# Patient Record
Sex: Female | Born: 1935
Health system: Southern US, Community
[De-identification: ages and names within clinical notes are randomized; demographics above are authoritative.]

## PROBLEM LIST (undated history)

## (undated) DIAGNOSIS — I10 Essential (primary) hypertension: Secondary | ICD-10-CM

## (undated) DIAGNOSIS — K5790 Diverticulosis of intestine, part unspecified, without perforation or abscess without bleeding: Secondary | ICD-10-CM

## (undated) DIAGNOSIS — E039 Hypothyroidism, unspecified: Secondary | ICD-10-CM

## (undated) DIAGNOSIS — I493 Ventricular premature depolarization: Secondary | ICD-10-CM

## (undated) DIAGNOSIS — E785 Hyperlipidemia, unspecified: Secondary | ICD-10-CM

## (undated) DIAGNOSIS — I472 Ventricular tachycardia, unspecified: Secondary | ICD-10-CM

## (undated) DIAGNOSIS — E119 Type 2 diabetes mellitus without complications: Secondary | ICD-10-CM

## (undated) DIAGNOSIS — K5792 Diverticulitis of intestine, part unspecified, without perforation or abscess without bleeding: Secondary | ICD-10-CM

## (undated) DIAGNOSIS — C4491 Basal cell carcinoma of skin, unspecified: Secondary | ICD-10-CM

## (undated) DIAGNOSIS — I080 Rheumatic disorders of both mitral and aortic valves: Secondary | ICD-10-CM

## (undated) DIAGNOSIS — C50919 Malignant neoplasm of unspecified site of unspecified female breast: Secondary | ICD-10-CM

## (undated) DIAGNOSIS — Z923 Personal history of irradiation: Secondary | ICD-10-CM

## (undated) DIAGNOSIS — H269 Unspecified cataract: Secondary | ICD-10-CM

## (undated) HISTORY — DX: Rheumatic disorders of both mitral and aortic valves: I08.0

## (undated) HISTORY — DX: Ventricular premature depolarization: I49.3

## (undated) HISTORY — DX: Basal cell carcinoma of skin, unspecified: C44.91

## (undated) HISTORY — DX: Ventricular tachycardia, unspecified: I47.20

## (undated) HISTORY — DX: Hyperlipidemia, unspecified: E78.5

## (undated) HISTORY — DX: Unspecified cataract: H26.9

## (undated) HISTORY — PX: TUBAL LIGATION: SHX77

## (undated) HISTORY — PX: COLONOSCOPY: SHX174

## (undated) HISTORY — PX: TONSILLECTOMY: SUR1361

## (undated) HISTORY — DX: Ventricular tachycardia: I47.2

## (undated) HISTORY — DX: Type 2 diabetes mellitus without complications: E11.9

## (undated) HISTORY — DX: Essential (primary) hypertension: I10

## (undated) HISTORY — DX: Hypothyroidism, unspecified: E03.9

## (undated) HISTORY — PX: EYE SURGERY: SHX253

## (undated) HISTORY — PX: APPENDECTOMY: SHX54

---

## 1997-08-28 ENCOUNTER — Other Ambulatory Visit: Admission: RE | Admit: 1997-08-28 | Discharge: 1997-08-28 | Payer: Self-pay | Admitting: *Deleted

## 1997-11-06 ENCOUNTER — Other Ambulatory Visit: Admission: RE | Admit: 1997-11-06 | Discharge: 1997-11-06 | Payer: Self-pay | Admitting: *Deleted

## 1998-10-21 ENCOUNTER — Other Ambulatory Visit: Admission: RE | Admit: 1998-10-21 | Discharge: 1998-10-21 | Payer: Self-pay | Admitting: *Deleted

## 2000-02-10 ENCOUNTER — Other Ambulatory Visit: Admission: RE | Admit: 2000-02-10 | Discharge: 2000-02-10 | Payer: Self-pay | Admitting: *Deleted

## 2001-06-15 ENCOUNTER — Other Ambulatory Visit: Admission: RE | Admit: 2001-06-15 | Discharge: 2001-06-15 | Payer: Self-pay | Admitting: *Deleted

## 2002-07-11 ENCOUNTER — Other Ambulatory Visit: Admission: RE | Admit: 2002-07-11 | Discharge: 2002-07-11 | Payer: Self-pay | Admitting: *Deleted

## 2002-10-19 ENCOUNTER — Ambulatory Visit (HOSPITAL_COMMUNITY): Admission: RE | Admit: 2002-10-19 | Discharge: 2002-10-19 | Payer: Self-pay | Admitting: Gastroenterology

## 2003-08-08 ENCOUNTER — Other Ambulatory Visit: Admission: RE | Admit: 2003-08-08 | Discharge: 2003-08-08 | Payer: Self-pay | Admitting: *Deleted

## 2004-10-08 ENCOUNTER — Other Ambulatory Visit: Admission: RE | Admit: 2004-10-08 | Discharge: 2004-10-08 | Payer: Self-pay | Admitting: *Deleted

## 2005-10-26 ENCOUNTER — Other Ambulatory Visit: Admission: RE | Admit: 2005-10-26 | Discharge: 2005-10-26 | Payer: Self-pay | Admitting: *Deleted

## 2006-10-31 ENCOUNTER — Other Ambulatory Visit: Admission: RE | Admit: 2006-10-31 | Discharge: 2006-10-31 | Payer: Self-pay | Admitting: *Deleted

## 2007-01-06 ENCOUNTER — Encounter: Payer: Self-pay | Admitting: Cardiology

## 2007-01-06 ENCOUNTER — Ambulatory Visit: Payer: Self-pay

## 2007-01-10 ENCOUNTER — Ambulatory Visit: Payer: Self-pay | Admitting: Cardiology

## 2007-01-13 ENCOUNTER — Ambulatory Visit: Payer: Self-pay | Admitting: Cardiology

## 2007-02-15 ENCOUNTER — Ambulatory Visit: Payer: Self-pay | Admitting: Cardiology

## 2007-02-28 ENCOUNTER — Ambulatory Visit: Payer: Self-pay | Admitting: Cardiology

## 2007-09-13 ENCOUNTER — Encounter: Payer: Self-pay | Admitting: Cardiology

## 2007-10-10 ENCOUNTER — Ambulatory Visit: Payer: Self-pay | Admitting: Cardiology

## 2008-05-20 ENCOUNTER — Ambulatory Visit: Payer: Self-pay | Admitting: Cardiology

## 2008-07-25 DIAGNOSIS — I10 Essential (primary) hypertension: Secondary | ICD-10-CM | POA: Insufficient documentation

## 2008-07-25 DIAGNOSIS — I080 Rheumatic disorders of both mitral and aortic valves: Secondary | ICD-10-CM | POA: Insufficient documentation

## 2008-07-25 DIAGNOSIS — E785 Hyperlipidemia, unspecified: Secondary | ICD-10-CM | POA: Insufficient documentation

## 2008-12-20 ENCOUNTER — Encounter: Admission: RE | Admit: 2008-12-20 | Discharge: 2008-12-20 | Payer: Self-pay | Admitting: Obstetrics and Gynecology

## 2008-12-26 ENCOUNTER — Ambulatory Visit (HOSPITAL_COMMUNITY): Admission: RE | Admit: 2008-12-26 | Discharge: 2008-12-26 | Payer: Self-pay | Admitting: Obstetrics and Gynecology

## 2009-05-22 ENCOUNTER — Encounter
Admission: RE | Admit: 2009-05-22 | Discharge: 2009-05-22 | Payer: Self-pay | Source: Home / Self Care | Admitting: Endocrinology

## 2009-07-09 ENCOUNTER — Encounter: Admission: RE | Admit: 2009-07-09 | Discharge: 2009-07-09 | Payer: Self-pay | Admitting: Obstetrics and Gynecology

## 2009-09-25 ENCOUNTER — Encounter: Admission: RE | Admit: 2009-09-25 | Discharge: 2009-10-03 | Payer: Self-pay | Admitting: Endocrinology

## 2010-01-25 ENCOUNTER — Encounter: Payer: Self-pay | Admitting: Obstetrics and Gynecology

## 2010-01-29 ENCOUNTER — Encounter
Admission: RE | Admit: 2010-01-29 | Discharge: 2010-01-29 | Payer: Self-pay | Source: Home / Self Care | Attending: Obstetrics and Gynecology | Admitting: Obstetrics and Gynecology

## 2010-05-19 NOTE — Assessment & Plan Note (Signed)
[facility]                          [facility]   NAME:NATIONSDennette, Faulconer                    MRN:          [id number]  DATE:05/20/2008                            DOB:          March 06, 1935    HISTORY OF PRESENT ILLNESS:  The patient is a pleasant 75 year old  female with a history of palpitations.  The patient has documented PVCs  in the past.  She states that she felt that episodes several weeks ago  with back pain and some abdominal pain.  She was concerned about UTI.  Dr. Dimas Aguas also was concerned whether she could have any side effects to  statin drug therapy and this was stopped at that time.  In the  meanwhile, the patient had sought a chiropractor, and she has her back  taking care of and now she states her symptoms have improved quite a  bit.  She was also concerned during this time because her heart rate was  in the low 30s.  However, I told that she likely has bigeminal rhythm  and the heart rate was truly in the 60s.  There was no associated  presyncope or syncope.   MEDICATIONS:  1. Synthroid 88 mcg p.o. daily.  2. MegaRed.  3. Vitamin D.  4. Coenzyme Q10.  5. Multivitamin.  6. Aspirin 81 mg p.o. daily.  7. Caltrate.  8. Losartan 50/12.5 b.i.d.  9. Osteo Bi-Flex.  10.Glucosamine daily.   PHYSICAL EXAMINATION:  VITAL SIGNS:  Blood pressure 131/82, heart rate  75 beats per minute, weight 175 pounds.  NECK:  Normal carotid stroke.  No carotid bruits.  LUNGS:  Clear breath sounds bilaterally.  HEART:  Regular rate and rhythm with normal S1 and S2.  No murmur, rubs,  or gallops.  ABDOMEN:  Soft, nontender.  No rebound or guarding.  Good bowel sounds.  EXTREMITIES:  No cyanosis, clubbing, or edema.   PROBLEMS:  1. History of premature ventricular contractions and bigeminy.  2. Normal left ventricular function and mild mitral regurgitation.  3. Hyperthyroidism.  4. Hypertension, controlled.  5. Dyslipidemia.    PLAN:  1. I do not think the patient's statin drug therapy has any      relationship to her symptoms.  She can resume this.  2. The patient likely had brief episodes of irritable bowel-type      symptoms.  This also has resolved.  3. Retried BETA-BLOCKERS in the past, the patient could not tolerate      due to fatigue.  At this point, I would not institute any further      medical therapy.      [name], MD,FACC  Electronically Signed    GED/MedQ  DD: 05/20/2008  DT: 05/21/2008  Job #: [id number]   cc:   Selinda Flavin

## 2010-05-19 NOTE — Assessment & Plan Note (Signed)
[facility]                          [facility]   NAME:Cristina Douglas, Cristina Douglas              MRN:          [id number]  DATE:02/15/2007                            DOB:          1935-09-02    REFERRING PHYSICIAN:  Selinda Flavin   HISTORY OF PRESENT ILLNESS:  Patient is a 75 year old female with the  history of palpitation.  She reports a prior history of transient  dizziness and hypertension.  The patient underwent cardiac monitoring,  which shows predominantly normal sinus rhythm with occasional PVCs and  bigeminy.  She was initially prescribed metoprolol, but stopped this due  to a decrease in heart rate and relatively low blood pressure.  She is  now off the medications, actually feeling quite well.  She had an  echocardiographic study done, which showed no evidence of LV  dysfunction.  Her ejection fraction was within normal limits.   MEDICATIONS:  1. Synthroid 88 mcg p.o. daily.  2. Lipitor 80 mg p.o. q.h.s.  3. Mega Red fish oil.  4. Vitamin D.  5. Co-enzyme Q10.  6. Multivitamin.  7. Aspirin 81 mg a day.  8. Caltrate.  9. Diovan/hydrochlorothiazide 80 mg/25 mg.   PHYSICAL EXAMINATION:  Blood pressure 135/86, weight 176 pounds.  NECK EXAM:  Normal carotid upstroke, no carotid bruits.  LUNGS:  Clear breath sounds bilaterally.  HEART:  Regular rate and rhythm, normal S1, S2, no murmurs or gallops.  ABDOMEN:  Soft.  EXTREMITY EXAM:  No cyanosis, clubbing or edema.   PROBLEM LIST:  1. Premature ventricular contractions.  2. Normal LV systolic function.  3. Mild mitral regurgitation.  4. Hypothyroidism.  5. Hypertension.  6. Hyperlipidemia.   PLAN:  1. Patient is actually doing quite well from the perspective of      palpitations.  She was reassured that her PVC are rather benign and      we will discontinue metoprolol altogether.  2. The patient will need further screening with a Cardiolite stress      test to make  sure that her      PVC are not related to underlying ischemic heart disease, although      this is less likely.  3. The patient will follow up with me in the next couple of months.     [name], MD,FACC  Electronically Signed    GED/MedQ  DD: 02/16/2007  DT: 02/17/2007  Job #: [id number]   cc:   Selinda Flavin

## 2010-05-19 NOTE — Assessment & Plan Note (Signed)
[facility]                          [facility]   NAME:NATIONSMalyssa, Cristina Douglas                    MRN:          [id number]  DATE:10/10/2007                            DOB:          1935-09-17    REFERRING PHYSICIAN:  Dr. Selinda Douglas.   HISTORY OF PRESENT ILLNESS:  The patient is very pleasant 75 year old  female with a history of palpitations.  The patient actually has been  doing well and reports no palpitations.  She is mainly concerned about  her blood pressure and brought in some blood pressure readings.  She  states that on the higher dose of Diovan at 160 mg she felt washed out.  However, on the lower dose, her blood pressure does not appear to be  controlled.  She denies any orthopnea, PND, palpitations or syncope.   MEDICATIONS:  1. Synthroid 88 mcg p.o. daily.  2. Lipitor 80 mg p.o. nightly.  3. Omega-3.  4. Vitamin D.  5. Coenzyme Q10.  6. Multivitamin.  7. Aspirin 81 mg p.o. daily.  8. Caltrate 600 mg p.o. b.i.d.  9. Diovan/hydrochlorothiazide 80/12.5 mg p.o. q.i.d.   PHYSICAL EXAMINATION:  VITAL SIGNS:  Blood pressure 152/89, heart rate  74, weight 178 pounds.  NECK:  Normal carotid upstroke and no carotid bruits.  LUNGS:  Clear breath sounds bilaterally.  HEART:  Regular rate and rhythm with normal S1 and S2, and no murmur,  rubs or gallops.  ABDOMEN:  Soft, nontender.  No rebound or guarding.  Good bowel sounds.  EXTREMITIES:  No cyanosis, clubbing or edema.   PROBLEM LIST:  1. History of premature ventricular contractions, resolved.  2. Left ventricular systolic function.  3. Mitral regurgitation.  4. Hypothyroidism.  5. Hypertension.  6. Dyslipidemia.   PLAN:  1. The patient's dyslipidemia is followed by Dr. Dimas Aguas.  2. The patient requested switch from her Diovan as her insurance      company has adhered to medication i.e. Hyzaar, which would be      cheaper for her.  Given the fact that on the higher  dose of Diovan      that she did not feel good as split her Hyzaar in 50 mg/12.5 mg to      take      b.i.d.  3. The patient can follow up with [place] and with Dr. Dimas Aguas regarding her      blood pressure.     [name], MD,FACC  Electronically Signed    GED/MedQ  DD: 10/10/2007  DT: 10/11/2007  Job #: [id number]   cc:   Cristina Douglas

## 2010-05-19 NOTE — Assessment & Plan Note (Signed)
[facility]                          [facility]   NAME:Fullilove, RANIKA MCNIEL              MRN:          [id number]  DATE:01/13/2007                            DOB:          1935/02/22    REASON FOR CONSULTATION:  Palpitations, ventricular bigeminy.   Ms. Hopson is a very pleasant 75 year old female, with no prior cardiac  history, whose husband, Adalina Dopson, is followed by Dr. Simona Huh  here in our [place] clinic.  She is now referred by Dr. Selinda Flavin for  recent complaints of transient dizziness with associated hypertension.  She was found to have ventricular bigeminy by a recent EKG, and was  referred for further evaluation with a 2D echocardiogram and a CardioNet  monitor.   The 2D echocardiogram, reviewed by Dr. Willa Rough, revealed normal  left ventricular function with no wall motion abnormalities.  There was  mild mitral regurgitation.   The CardioNet monitor, which was just placed 3 days ago, thus far shows  resting normal sinus rhythm with intermittent ventricular bigeminy, but  no PVC couplets or triplets.  There is also no evidence of atrial  fibrillation, thus far.   Clinically, the patient reports only one episode of quivering, which  she felt in her neck a week or so ago.  This was at a time where she  took her blood pressure and noted it to be approximately 80 systolic.  She did feel fatigued and tired, but did not report any frank syncope.  Her blood pressure since then, however, has remained stable and, in  fact, at times is elevated with readings of 125 - 170, systolic.   The patient denies any history of exertional angina pectoris.  She has  only mild exertional dyspnea, with no recent progression.  She denies  any orthopnea, PND or lower extremity edema.   The patient's cardiac risk factors are notable for hypertension,  hyperlipidemia and age.  She denies any history of diabetes mellitus,  family history of premature coronary artery disease and has never smoked  tobacco.   Electrocardiogram in our office today reveals NSR at 74 bpm with normal  axis and nonspecific ST changes.  There are Q-waves in leads V1 and V2  which are chronic.   ALLERGIES:  SULFA.   CURRENT MEDICATIONS:  1. Synthroid 0.088 mg daily.  2. Diovan HCT 160/25 mg daily.  3. Lipitor 80 daily.  4. Omega 3 fish oil daily.  5. Aspirin 81 daily.   PAST MEDICAL HISTORY:  1. Hypertension.  2. Hyperlipidemia.  3. Hypotension.  4. History of basal cell carcinoma.  5. Status post tubal ligation.  6. Remote appendectomy.   REVIEW OF SYSTEMS:  Denies history of myocardial infarction, congestive  heart failure or stroke.  Otherwise as noted per HPI, remaining systems  negative.   SOCIAL HISTORY:  Married.  Has never smoked tobacco and drinks alcohol  only on rare occasion.   FAMILY HISTORY:  Father deceased at age 68, history of prostate cancer.  Mother deceased age 55, diabetes mellitus.   PHYSICAL EXAMINATION:  Blood pressure 134/83, pulse 80 and regular  weight 177.  GENERAL:  A 75 year old female, moderately obese, sitting upright in no  distress.  HEENT:  Normocephalic/atraumatic.  NECK:  Palpable bilateral carotid pulses without bruits; no JVD at 90  degrees.  LUNGS:  Clear to auscultation all fields.  HEART:  Regular rate and rhythm (S1-S2), no significant murmurs.  No  rubs or gallops.  ABDOMEN:  Soft, nontender, intact bowel sounds.  EXTREMITIES:  Palpable distal pulses without pedal edema.  NEURO:  No focal deficit.   IMPRESSION:  1. Frequent ventricular ectopy.      a.     No documented evidence of nonsustained ventricular       tachycardia, by current CardioNet monitoring.  2. Normal left ventricular function.      a.     By current 2D echo.  3. Mild mitral regurgitation.  4. Hypothyroidism.  5. Hypertension.  6. Hyperlipidemia.   PLAN:  1. Initiate beta-blocker treatment for  the frequent PVCs, with      metoprolol ER 25 mg daily.  2. Down titrate Diovan to 80 mg/25 mg daily, given the addition of the      beta blocker.  3. Complete CardioNet monitoring for the full 3 weeks.  4. Schedule return clinic follow-up with myself and Dr. Andee Lineman in one      month, for review of completed CardioNet results and further      recommendations.      Gene Serpe, PA-C  Electronically Signed      [name], MD,FACC  Electronically Signed   GS/MedQ  DD: 01/13/2007  DT: 01/13/2007  Job #: [id number]   cc:   Selinda Flavin

## 2010-05-22 NOTE — Op Note (Signed)
   NAME:  Cristina Douglas, Cristina Douglas                 ACCOUNT NO.:  [id number]   MEDICAL RECORD NO.:  [id number]                   PATIENT TYPE:  AMB   LOCATION:  END[facility]                                 FACILITY:  [facility]   PHYSICIAN:  Anselmo Rod, M.D.               DATE OF BIRTH:  June 23, 1935   DATE OF PROCEDURE:  10/19/2002  DATE OF DISCHARGE:                                 OPERATIVE REPORT   PROCEDURE PERFORMED:  Screening colonoscopy.   ENDOSCOPIST:  Anselmo Rod, M.D.   INSTRUMENT USED:  Olympus video colonoscope.   INDICATION FOR PROCEDURE:  Screening colonoscopy being performed in a 75-  year-old white female with a personal history of sigmoid diverticulosis.  Rule out colonic polyps, masses, etc.   PREPROCEDURE PREPARATION:  The patient was prepped with a bottle of  magnesium citrate and a gallon of GoLYTELY the night prior to the procedure  and was fasted for eight hours prior to the procedure.  Informed consent was  procured.   PREPROCEDURE PHYSICAL:  VITAL SIGNS:  The patient had stable vital signs.  NECK:  Supple.  CHEST:  Clear to auscultation.  S1, S2 regular.  ABDOMEN:  Soft with normal bowel sounds.   DESCRIPTION OF PROCEDURE:  The patient was placed in the left lateral  decubitus position and sedated with 60 mg of Demerol and 6 mg of Versed  intravenously.  Once the patient was adequately sedate and maintained on low-  flow oxygen and continuous cardiac monitoring, the Olympus video colonoscope  was advanced from the rectum to the cecum with extreme difficulty.  There  was a large amount of solid residual stool in several of the diverticula and  the sigmoid colon.  The appendiceal orifice and the ileocecal valve were  visualized and photographed.  Multiple washes were done.  Retroflexion in  the rectum revealed small internal hemorrhoids.  No masses or polyps were  seen.  Small lesions could have been missed.   IMPRESSION:  1. Extensive sigmoid diverticulosis  with inspissated stool in several of the     diverticula.  2. Small internal hemorrhoids.    RECOMMENDATIONS:  1. Repeat CRC screening in the next five years unless the patient develops     any abnormal symptoms in the interim.  2. A high-fiber diet with liberal fluid intake.  3. Outpatient follow-up as the need arises in the future.                                               Anselmo Rod, M.D.    JNM/MEDQ  D:  10/19/2002  T:  10/20/2002  Job:  [id number]   cc:   Almedia Balls. Fore, M.D.  [phone] [address] [place]  [place] [address]  Fax: [phone]

## 2010-05-22 NOTE — Assessment & Plan Note (Signed)
[facility]                                 ON-CALL NOTE   NAME:Cristina Douglas, Cristina Douglas                      MRN:          [id number]  DATE:01/28/2007                            DOB:          1935-02-01    PRIMARY CARE PHYSICIAN:  Dr. Selinda Flavin.   PRIMARY CARDIOLOGIST:  Dr. Lewayne Bunting.   I received a phone call today from [name] complaining of feeling  weak and tired and that her heart rate was low.  The patient was last  seen by Dr. Andee Lineman on January 13, 2007 and a CardioNet was placed on her  to evaluate for ventricular tachycardia or arrhythmias.  She was started  on beta blocker, metoprolol ER 25 mg daily, and her Diovan was titrated  down to 80/25 mg daily.  The patient states that she has noticed that  her blood pressure has been low in the low 100s to 90s systolic, and her  heart rate has been in the 30s and 40s.  The patient states she has been  feeling lightheaded, dizzy, and weak, and was concerned about these  symptoms.  The patient did take her medications today and the last blood  pressure was 107/56 with a heart rate of 40.  The patient was calling  for advisement.   I have advised the patient not to take the metoprolol tomorrow, and to  monitor her blood pressure and heart rate tomorrow after getting up, and  to call us back if her blood pressure remains low or heart rate remains  low, and we may need to stop her Diovan for 24 hours as well.  At this  time the patient already has metoprolol and Diovan on board, and I have  asked her to take it easy today, not to drive, and to stay home if  possible, and to call us tomorrow if she is not feeling better.  I will  also leave a note for Dr. Andee Lineman to make him aware that this patient is  having these symptoms and for them to call to her on Monday so that they  can see her sooner than later, given her symptoms and holding of the  metoprolol.  She verbalized understanding.  I have advised  her if she  becomes more symptomatic, she will need to go to the emergency room.      Bettey Mare. Lyman Bishop, NP  Electronically Signed      [name], MD,FACC  Electronically Signed   KML/MedQ  DD: 01/28/2007  DT: 01/28/2007  Job #: [id number]

## 2010-09-01 ENCOUNTER — Encounter: Payer: Self-pay | Admitting: *Deleted

## 2010-09-01 ENCOUNTER — Encounter: Payer: Self-pay | Admitting: Cardiology

## 2010-09-01 ENCOUNTER — Ambulatory Visit (INDEPENDENT_AMBULATORY_CARE_PROVIDER_SITE_OTHER): Payer: Medicare Other | Admitting: Cardiology

## 2010-09-01 VITALS — BP 149/81 | HR 76 | Ht 63.0 in | Wt 177.0 lb

## 2010-09-01 DIAGNOSIS — I951 Orthostatic hypotension: Secondary | ICD-10-CM | POA: Insufficient documentation

## 2010-09-01 DIAGNOSIS — R002 Palpitations: Secondary | ICD-10-CM | POA: Insufficient documentation

## 2010-09-01 DIAGNOSIS — I10 Essential (primary) hypertension: Secondary | ICD-10-CM

## 2010-09-01 NOTE — Assessment & Plan Note (Signed)
Will obtain a CardioNet monitor

## 2010-09-01 NOTE — Progress Notes (Signed)
Pt called stating losartan/HCTZ is correct as listed in the chart.

## 2010-09-01 NOTE — Patient Instructions (Signed)
Your physician wants you to follow-up in: 6 months. You will receive a reminder letter in the mail one-two months in advance. If you don't receive a letter, please call our office to schedule the follow-up appointment. Your physician recommends that you continue on your current medications as directed. Please refer to the Current Medication list given to you today. Call the office with correct name/dosage for losartan vs losartan/HCTZ Your physician has recommended that you wear an event monitor. Event monitors are medical devices that record the heart's electrical activity. Doctors most often [place] these monitors to diagnose arrhythmias. Arrhythmias are problems with the speed or rhythm of the heartbeat. The monitor is a small, portable device. You can wear one while you do your normal daily activities. This is usually used to diagnose what is causing palpitations/syncope (passing out). If the results of your test are normal or stable, you will receive a letter. If they are abnormal, the nurse will contact you by phone.

## 2010-09-01 NOTE — Progress Notes (Signed)
HPI The patient is a 75 year old female with a prior history of palpitations. Several years ago she wore a Holter monitor and was noted to have PVCs and bigeminy. She is essentially presenting for routine followup but has also noticed on a few occasions and her heart rate is in the 40s when she takes her blood pressure. At that time she feels tired and fatigued and has some weakness and listlessness. She reports that on a few occasions her blood pressure was 70/40 with any presyncope or syncope. She denied any substernal chest pain. She reports occasional tightness in the back. Otherwise from a cardiovascular standpoint she has done well. Unfortunate the patient is not certain about some of her medications in particular she doesn't know if she still taking losartan with or without hydrochlorothiazide.  Allergies  Allergen Reactions  . Sulfa Antibiotics     No current outpatient prescriptions on file prior to visit.    Past Medical History  Diagnosis Date  . Unspecified essential hypertension   . Other and unspecified hyperlipidemia   . Mitral valve insufficiency and aortic valve insufficiency   . Hypothyroidism     Past Surgical History  Procedure Date  . Tonsillectomy   . Tubal ligation   . Appendectomy     No family history on file.  History   Social History  . Marital Status: Married    Spouse Name: [name]    Number of Children: N/A  . Years of Education: N/A   Occupational History  . RETIRED    Social History Main Topics  . Smoking status: Never Smoker   . Smokeless tobacco: Never Used  . Alcohol Use: No  . Drug Use: No  . Sexually Active: Not on file   Other Topics Concern  . Not on file   Social History Narrative  . No narrative on file   [id number] positives as outlined above. The remainder of the 18  point review of systems is negative  PHYSICAL EXAM BP 149/81  Pulse 76  Ht 5\' 3"  (1.6 m)  Wt 177 lb (80.287 kg)  BMI 31.35 kg/m2  General:  Well-developed, well-nourished in no distress Head: Normocephalic and atraumatic Eyes:PERRLA/EOMI intact, conjunctiva and lids normal Ears: No deformity or lesions Mouth:normal dentition, normal posterior pharynx Neck: Supple, no JVD.  No masses, thyromegaly or abnormal cervical nodes Lungs: Normal breath sounds bilaterally without wheezing.  Normal percussion Cardiac: regular rate and rhythm with normal S1 and S2, no S3 or S4.  PMI is normal.  No pathological murmurs Abdomen: Normal bowel sounds, abdomen is soft and nontender without masses, organomegaly or hernias noted.  No hepatosplenomegaly MSK: Back normal, normal gait muscle strength and tone normal Vascular: Pulse is normal in all 4 extremities Extremities: No peripheral pitting edema Neurologic: Alert and oriented x 3 Skin: Intact without lesions or rashes Lymphatics: No significant adenopathy Psychologic: Normal affect  ECG: NSR, no acute changes.   ASSESSMENT AND PLAN

## 2010-09-01 NOTE — Assessment & Plan Note (Signed)
No evidence of orthostatic hypotension by blood pressure readings

## 2010-09-01 NOTE — Assessment & Plan Note (Signed)
Hypertension: Blood pressures uncontrolled but were not sure what medications the patient is on and therefore will wait until she calls [place] back with the above information.

## 2010-09-03 ENCOUNTER — Other Ambulatory Visit: Payer: Self-pay | Admitting: Obstetrics and Gynecology

## 2010-09-03 DIAGNOSIS — R921 Mammographic calcification found on diagnostic imaging of breast: Secondary | ICD-10-CM

## 2010-09-04 ENCOUNTER — Telehealth: Payer: Self-pay | Admitting: *Deleted

## 2010-09-04 NOTE — Telephone Encounter (Signed)
Wanted to let us know that she had only been taking her Losartan daily.  States husband fills up her pill boxes & had only been putting in for daily, not twice a day.  Advised her to continue to monitor bp readings & send in over next couple of weeks for GD to review.  She verbalized understanding.

## 2010-09-09 NOTE — Telephone Encounter (Signed)
Agree 

## 2010-09-10 ENCOUNTER — Ambulatory Visit
Admission: RE | Admit: 2010-09-10 | Discharge: 2010-09-10 | Disposition: A | Discharge status: Home / Self Care | Payer: Medicare Other | Source: Ambulatory Visit | Attending: Obstetrics and Gynecology | Admitting: Obstetrics and Gynecology

## 2010-09-10 DIAGNOSIS — R921 Mammographic calcification found on diagnostic imaging of breast: Secondary | ICD-10-CM

## 2010-09-10 NOTE — Telephone Encounter (Signed)
Noted  

## 2010-10-09 ENCOUNTER — Telehealth: Payer: Self-pay | Admitting: *Deleted

## 2010-10-09 NOTE — Telephone Encounter (Signed)
Patient left message on voicemail - bordering on diabetes & trying to lose weight.  Dr. Lurene Shadow is recommending putting her on Qysemia for weight loss.  Want to know if this would be okay?

## 2010-10-13 NOTE — Telephone Encounter (Signed)
Patient has palpitations and recently had a Cardionet ( not seen results yet??). Qysemia, has only just been approved and we don't know much about the drug, but can increase heart rate and couls make palpitations worse.

## 2010-10-14 ENCOUNTER — Other Ambulatory Visit: Payer: Self-pay | Admitting: *Deleted

## 2010-10-14 MED ORDER — METOPROLOL TARTRATE 25 MG PO TABS: ORAL_TABLET | ORAL | Status: DC

## 2010-10-14 NOTE — Telephone Encounter (Signed)
Patient notified and verbalized understanding. 

## 2011-01-19 ENCOUNTER — Other Ambulatory Visit: Payer: Self-pay | Admitting: Obstetrics and Gynecology

## 2011-01-19 DIAGNOSIS — R921 Mammographic calcification found on diagnostic imaging of breast: Secondary | ICD-10-CM

## 2011-01-28 ENCOUNTER — Ambulatory Visit
Admission: RE | Admit: 2011-01-28 | Discharge: 2011-01-28 | Disposition: A | Discharge status: Home / Self Care | Payer: Medicare Other | Source: Ambulatory Visit | Attending: Obstetrics and Gynecology | Admitting: Obstetrics and Gynecology

## 2011-01-28 DIAGNOSIS — R921 Mammographic calcification found on diagnostic imaging of breast: Secondary | ICD-10-CM

## 2011-03-24 ENCOUNTER — Other Ambulatory Visit (HOSPITAL_COMMUNITY): Payer: Self-pay | Admitting: *Deleted

## 2011-03-29 ENCOUNTER — Encounter (HOSPITAL_COMMUNITY): Payer: Medicare Other

## 2011-05-06 ENCOUNTER — Other Ambulatory Visit (HOSPITAL_COMMUNITY): Payer: Self-pay | Admitting: *Deleted

## 2011-05-07 ENCOUNTER — Encounter (HOSPITAL_COMMUNITY)
Admission: RE | Admit: 2011-05-07 | Discharge: 2011-05-07 | Disposition: A | Discharge status: Home / Self Care | Payer: Medicare Other | Source: Ambulatory Visit | Attending: Obstetrics and Gynecology | Admitting: Obstetrics and Gynecology

## 2011-05-07 DIAGNOSIS — M810 Age-related osteoporosis without current pathological fracture: Secondary | ICD-10-CM | POA: Insufficient documentation

## 2011-05-07 MED ORDER — ZOLEDRONIC ACID 5 MG/100ML IV SOLN
5.0000 mg | Freq: Once | INTRAVENOUS | Status: AC
  Administered 2011-05-07: 5 mg via INTRAVENOUS
  Filled 2011-05-07: qty 100

## 2011-05-14 ENCOUNTER — Other Ambulatory Visit: Payer: Self-pay | Admitting: Cardiology

## 2011-07-13 ENCOUNTER — Ambulatory Visit: Payer: Medicare Other | Admitting: Cardiology

## 2011-09-07 ENCOUNTER — Other Ambulatory Visit: Payer: Self-pay | Admitting: Cardiology

## 2011-09-08 ENCOUNTER — Ambulatory Visit: Payer: Medicare Other | Admitting: Cardiology

## 2011-09-09 MED ORDER — METOPROLOL TARTRATE 25 MG PO TABS: 25.0000 mg | ORAL_TABLET | Freq: Two times a day (BID) | ORAL | Status: DC

## 2011-09-10 ENCOUNTER — Ambulatory Visit: Payer: Medicare Other | Admitting: Cardiology

## 2011-12-13 ENCOUNTER — Other Ambulatory Visit: Payer: Self-pay | Admitting: Physician Assistant

## 2011-12-24 ENCOUNTER — Other Ambulatory Visit: Payer: Self-pay | Admitting: Obstetrics and Gynecology

## 2011-12-24 DIAGNOSIS — Z1231 Encounter for screening mammogram for malignant neoplasm of breast: Secondary | ICD-10-CM

## 2012-01-14 ENCOUNTER — Telehealth: Payer: Self-pay | Admitting: Cardiology

## 2012-01-14 NOTE — Telephone Encounter (Signed)
FOLLOWING DEGENT °

## 2012-02-03 ENCOUNTER — Ambulatory Visit: Payer: Medicare Other

## 2012-02-22 ENCOUNTER — Other Ambulatory Visit (HOSPITAL_COMMUNITY): Payer: Medicare Other

## 2012-02-28 ENCOUNTER — Ambulatory Visit (HOSPITAL_COMMUNITY): Admission: RE | Admit: 2012-02-28 | Payer: Medicare Other | Source: Ambulatory Visit | Admitting: Ophthalmology

## 2012-02-28 ENCOUNTER — Encounter (HOSPITAL_COMMUNITY): Admission: RE | Payer: Self-pay | Source: Ambulatory Visit

## 2012-02-28 SURGERY — PHACOEMULSIFICATION, CATARACT, WITH IOL INSERTION
Anesthesia: Monitor Anesthesia Care | Site: Eye | Laterality: Left

## 2012-03-02 ENCOUNTER — Ambulatory Visit
Admission: RE | Admit: 2012-03-02 | Discharge: 2012-03-02 | Disposition: A | Discharge status: Home / Self Care | Payer: Medicare Other | Source: Ambulatory Visit | Attending: Obstetrics and Gynecology | Admitting: Obstetrics and Gynecology

## 2012-03-02 DIAGNOSIS — Z1231 Encounter for screening mammogram for malignant neoplasm of breast: Secondary | ICD-10-CM

## 2012-04-09 ENCOUNTER — Other Ambulatory Visit: Payer: Self-pay | Admitting: Cardiology

## 2012-07-19 ENCOUNTER — Encounter: Payer: Self-pay | Admitting: Cardiovascular Disease

## 2012-07-19 ENCOUNTER — Ambulatory Visit (INDEPENDENT_AMBULATORY_CARE_PROVIDER_SITE_OTHER): Payer: 59 | Admitting: Cardiovascular Disease

## 2012-07-19 VITALS — BP 130/80 | HR 76 | Ht 63.0 in | Wt 179.0 lb

## 2012-07-19 DIAGNOSIS — I10 Essential (primary) hypertension: Secondary | ICD-10-CM

## 2012-07-19 DIAGNOSIS — E785 Hyperlipidemia, unspecified: Secondary | ICD-10-CM

## 2012-07-19 DIAGNOSIS — R002 Palpitations: Secondary | ICD-10-CM

## 2012-07-19 NOTE — Patient Instructions (Signed)
Continue all current medications. Your physician wants you to follow up in:  1 year.  You will receive a reminder letter in the mail one-two months in advance.  If you don't receive a letter, please call our office to schedule the follow up appointment   

## 2012-07-19 NOTE — Progress Notes (Signed)
Patient ID: Cristina Douglas, female   DOB: 12-08-1935, 77 y.o.   MRN: [id number]    SUBJECTIVE: [name] is a 77 year-old female with a prior history of palpitations. Several years ago she wore a Holter monitor and was noted to have PVCs and bigeminy. She is essentially presenting for routine followup.  She denies chest pain, SOB, palpitations. She underwent cataract surgery recently without any problems.  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 She and her husband enjoy reading quite a bit.   Allergies   Allergen  Reactions   .  Sulfa Antibiotics     No current outpatient prescriptions on file prior to visit.    Past Medical History   Diagnosis  Date   .  Unspecified essential hypertension    .  Other and unspecified hyperlipidemia    .  Mitral valve insufficiency and aortic valve insufficiency    .  Hypothyroidism     Past Surgical History   Procedure  Date   .  Tonsillectomy    .  Tubal ligation    .  Appendectomy       BP: 130/80 mmHg HR: 76 bpm  PHYSICAL EXAM General: NAD Neck: No JVD, no thyromegaly or thyroid nodule.  Lungs: Clear to auscultation bilaterally with normal respiratory effort. CV: Nondisplaced PMI.  Heart regular S1/S2, no S3/S4, no murmur.  No peripheral edema.  No carotid bruit.  Normal pedal pulses.  Abdomen: Soft, nontender, no hepatosplenomegaly, no distention.  Neurologic: Alert and oriented x 3.  Psych: Normal affect. Extremities: No clubbing or cyanosis.    LABS: Basic Metabolic Panel: No results found for this basename: NA, K, CL, CO2, GLUCOSE, BUN, CREATININE, CALCIUM, MG, PHOS,  in the last 72 hours Liver Function Tests: No results found for this basename: AST, ALT, ALKPHOS, BILITOT, PROT, ALBUMIN,  in the last 72 hours No results found for this basename: LIPASE, AMYLASE,  in the last 72 hours CBC: No results found for this basename: WBC, NEUTROABS, HGB, HCT, MCV, PLT,  in  the last 72 hours Cardiac Enzymes: No results found for this basename: CKTOTAL, CKMB, CKMBINDEX, TROPONINI,  in the last 72 hours BNP: No components found with this basename: POCBNP,  D-Dimer: No results found for this basename: DDIMER,  in the last 72 hours Hemoglobin A1C: No results found for this basename: HGBA1C,  in the last 72 hours Fasting Lipid Panel: No results found for this basename: CHOL, HDL, LDLCALC, TRIG, CHOLHDL, LDLDIRECT,  in the last 72 hours Thyroid Function Tests: No results found for this basename: TSH, T4TOTAL, FREET3, T3FREE, THYROIDAB,  in the last 72 hours Anemia Panel: No results found for this basename: VITAMINB12, FOLATE, FERRITIN, TIBC, IRON, RETICCTPCT,  in the last 72 hours  ECG: NSR, 76 bpm  ASSESSMENT AND PLAN: 1. Palpitations/PVC's: asymptomatic, continue Metoprolol 12.5 mg bid. 2. HTN: controlled on Hyzaar. 3. Hyperlipidemia: on Lipitor. Monitored by PCP.   Prentice Docker, M.D., F.A.C.C.

## 2012-08-09 ENCOUNTER — Other Ambulatory Visit: Payer: Self-pay

## 2012-09-21 ENCOUNTER — Other Ambulatory Visit: Payer: Self-pay

## 2012-09-21 DIAGNOSIS — Z1231 Encounter for screening mammogram for malignant neoplasm of breast: Secondary | ICD-10-CM

## 2012-10-02 ENCOUNTER — Other Ambulatory Visit: Payer: Self-pay | Admitting: Cardiovascular Disease

## 2012-11-09 ENCOUNTER — Other Ambulatory Visit: Payer: Self-pay

## 2013-02-06 ENCOUNTER — Other Ambulatory Visit: Payer: Self-pay | Admitting: Cardiovascular Disease

## 2013-02-07 ENCOUNTER — Other Ambulatory Visit: Payer: Self-pay | Admitting: Cardiology

## 2013-02-07 MED ORDER — METOPROLOL TARTRATE 25 MG PO TABS: ORAL_TABLET | ORAL | Status: DC

## 2013-03-08 ENCOUNTER — Ambulatory Visit: Payer: 59

## 2013-03-22 ENCOUNTER — Ambulatory Visit
Admission: RE | Admit: 2013-03-22 | Discharge: 2013-03-22 | Disposition: A | Discharge status: Home / Self Care | Payer: 59 | Source: Ambulatory Visit

## 2013-03-22 ENCOUNTER — Ambulatory Visit
Admission: RE | Admit: 2013-03-22 | Discharge: 2013-03-22 | Disposition: A | Discharge status: Home / Self Care | Payer: Medicare Other | Source: Ambulatory Visit

## 2013-03-22 ENCOUNTER — Other Ambulatory Visit: Payer: Self-pay

## 2013-03-22 DIAGNOSIS — Z1231 Encounter for screening mammogram for malignant neoplasm of breast: Secondary | ICD-10-CM

## 2013-03-27 ENCOUNTER — Other Ambulatory Visit: Payer: Self-pay | Admitting: Obstetrics and Gynecology

## 2013-03-27 DIAGNOSIS — R928 Other abnormal and inconclusive findings on diagnostic imaging of breast: Secondary | ICD-10-CM

## 2013-04-04 ENCOUNTER — Other Ambulatory Visit: Payer: Self-pay | Admitting: Obstetrics and Gynecology

## 2013-04-04 ENCOUNTER — Ambulatory Visit
Admission: RE | Admit: 2013-04-04 | Discharge: 2013-04-04 | Disposition: A | Discharge status: Home / Self Care | Payer: Medicare Other | Source: Ambulatory Visit | Attending: Obstetrics and Gynecology | Admitting: Obstetrics and Gynecology

## 2013-04-04 DIAGNOSIS — R921 Mammographic calcification found on diagnostic imaging of breast: Secondary | ICD-10-CM

## 2013-04-04 DIAGNOSIS — R928 Other abnormal and inconclusive findings on diagnostic imaging of breast: Secondary | ICD-10-CM

## 2013-04-10 ENCOUNTER — Ambulatory Visit
Admission: RE | Admit: 2013-04-10 | Discharge: 2013-04-10 | Disposition: A | Discharge status: Home / Self Care | Payer: Medicare Other | Source: Ambulatory Visit | Attending: Obstetrics and Gynecology | Admitting: Obstetrics and Gynecology

## 2013-04-10 ENCOUNTER — Other Ambulatory Visit: Payer: Self-pay | Admitting: Obstetrics and Gynecology

## 2013-04-10 DIAGNOSIS — R921 Mammographic calcification found on diagnostic imaging of breast: Secondary | ICD-10-CM

## 2013-04-10 HISTORY — PX: BREAST BIOPSY: SHX20

## 2013-04-11 ENCOUNTER — Other Ambulatory Visit: Payer: Self-pay | Admitting: Obstetrics and Gynecology

## 2013-04-11 ENCOUNTER — Ambulatory Visit
Admission: RE | Admit: 2013-04-11 | Discharge: 2013-04-11 | Disposition: A | Discharge status: Home / Self Care | Payer: Self-pay | Source: Ambulatory Visit | Attending: Obstetrics and Gynecology | Admitting: Obstetrics and Gynecology

## 2013-04-11 DIAGNOSIS — D0510 Intraductal carcinoma in situ of unspecified breast: Secondary | ICD-10-CM

## 2013-04-11 DIAGNOSIS — R921 Mammographic calcification found on diagnostic imaging of breast: Secondary | ICD-10-CM

## 2013-04-12 ENCOUNTER — Telehealth: Payer: Self-pay | Admitting: *Deleted

## 2013-04-12 DIAGNOSIS — C50511 Malignant neoplasm of lower-outer quadrant of right female breast: Secondary | ICD-10-CM

## 2013-04-12 NOTE — Telephone Encounter (Signed)
Left message for a return phone call to schedule for [facility] 04/18/13.  Awaiting patient response.

## 2013-04-12 NOTE — Telephone Encounter (Signed)
Confirmed [facility] for 04/18/13 at 12N .  Instructions and contact information given. 

## 2013-04-13 ENCOUNTER — Ambulatory Visit
Admission: RE | Admit: 2013-04-13 | Discharge: 2013-04-13 | Disposition: A | Discharge status: Home / Self Care | Payer: 59 | Source: Ambulatory Visit | Attending: Obstetrics and Gynecology | Admitting: Obstetrics and Gynecology

## 2013-04-13 DIAGNOSIS — D0510 Intraductal carcinoma in situ of unspecified breast: Secondary | ICD-10-CM

## 2013-04-13 MED ORDER — GADOBENATE DIMEGLUMINE 529 MG/ML IV SOLN
16.0000 mL | Freq: Once | INTRAVENOUS | Status: AC | PRN
  Administered 2013-04-13: 16 mL via INTRAVENOUS

## 2013-04-17 ENCOUNTER — Telehealth: Payer: Self-pay | Admitting: *Deleted

## 2013-04-17 NOTE — Telephone Encounter (Signed)
Please convey my sympathies.

## 2013-04-17 NOTE — Telephone Encounter (Signed)
Notified via voice mail that Dr. Bronson Ing aware.

## 2013-04-17 NOTE — Telephone Encounter (Signed)
Patient left message on voice mail - wanting to make Dr. Bronson Ing aware of her recent dx of breast cancer.  Will probably be doing lumpectomy & radiation.  Will be meeting with cancer center at [facility] soon.Cristina Douglas

## 2013-04-18 ENCOUNTER — Encounter: Payer: Self-pay | Admitting: Oncology

## 2013-04-18 ENCOUNTER — Ambulatory Visit
Admission: RE | Admit: 2013-04-18 | Discharge: 2013-04-18 | Disposition: A | Discharge status: Home / Self Care | Payer: 59 | Source: Ambulatory Visit | Attending: Radiation Oncology | Admitting: Radiation Oncology

## 2013-04-18 ENCOUNTER — Other Ambulatory Visit (HOSPITAL_BASED_OUTPATIENT_CLINIC_OR_DEPARTMENT_OTHER): Payer: 59

## 2013-04-18 ENCOUNTER — Ambulatory Visit (HOSPITAL_BASED_OUTPATIENT_CLINIC_OR_DEPARTMENT_OTHER): Payer: Medicare Other | Admitting: General Surgery

## 2013-04-18 ENCOUNTER — Ambulatory Visit: Payer: Medicare Other

## 2013-04-18 ENCOUNTER — Encounter (INDEPENDENT_AMBULATORY_CARE_PROVIDER_SITE_OTHER): Payer: Self-pay | Admitting: General Surgery

## 2013-04-18 ENCOUNTER — Ambulatory Visit: Payer: 59 | Admitting: Physical Therapy

## 2013-04-18 ENCOUNTER — Encounter: Payer: Self-pay | Admitting: Radiation Oncology

## 2013-04-18 ENCOUNTER — Ambulatory Visit (HOSPITAL_BASED_OUTPATIENT_CLINIC_OR_DEPARTMENT_OTHER): Payer: 59 | Admitting: Oncology

## 2013-04-18 VITALS — BP 155/87 | HR 64 | Temp 98.4°F | Resp 18 | Ht 63.0 in | Wt 174.4 lb

## 2013-04-18 DIAGNOSIS — C50519 Malignant neoplasm of lower-outer quadrant of unspecified female breast: Secondary | ICD-10-CM

## 2013-04-18 DIAGNOSIS — D0590 Unspecified type of carcinoma in situ of unspecified breast: Secondary | ICD-10-CM

## 2013-04-18 DIAGNOSIS — C50511 Malignant neoplasm of lower-outer quadrant of right female breast: Secondary | ICD-10-CM

## 2013-04-18 DIAGNOSIS — Z170 Estrogen receptor positive status [ER+]: Secondary | ICD-10-CM

## 2013-04-18 LAB — COMPREHENSIVE METABOLIC PANEL (CC13)
ALT: 18 U/L (ref 0–55)
AST: 24 U/L (ref 5–34)
Albumin: 4.2 g/dL (ref 3.5–5.0)
Alkaline Phosphatase: 60 U/L (ref 40–150)
Anion Gap: 9 mEq/L (ref 3–11)
BUN: 13.7 mg/dL (ref 7.0–26.0)
CO2: 30 mEq/L — ABNORMAL HIGH (ref 22–29)
Calcium: 10.2 mg/dL (ref 8.4–10.4)
Chloride: 99 mEq/L (ref 98–109)
Creatinine: 0.8 mg/dL (ref 0.6–1.1)
Glucose: 108 mg/dl (ref 70–140)
Potassium: 3.6 mEq/L (ref 3.5–5.1)
Sodium: 138 mEq/L (ref 136–145)
Total Bilirubin: 0.6 mg/dL (ref 0.20–1.20)
Total Protein: 7.2 g/dL (ref 6.4–8.3)

## 2013-04-18 LAB — CBC WITH DIFFERENTIAL/PLATELET
BASO%: 0.2 % (ref 0.0–2.0)
Basophils Absolute: 0 10*3/uL (ref 0.0–0.1)
EOS%: 1.9 % (ref 0.0–7.0)
Eosinophils Absolute: 0.2 10*3/uL (ref 0.0–0.5)
HCT: 41.3 % (ref 34.8–46.6)
HGB: 13.7 g/dL (ref 11.6–15.9)
LYMPH%: 18 % (ref 14.0–49.7)
MCH: 28.8 pg (ref 25.1–34.0)
MCHC: 33.1 g/dL (ref 31.5–36.0)
MCV: 87 fL (ref 79.5–101.0)
MONO#: 0.9 10*3/uL (ref 0.1–0.9)
MONO%: 10.3 % (ref 0.0–14.0)
NEUT#: 5.8 10*3/uL (ref 1.5–6.5)
NEUT%: 69.6 % (ref 38.4–76.8)
Platelets: 326 10*3/uL (ref 145–400)
RBC: 4.75 10*6/uL (ref 3.70–5.45)
RDW: 13.7 % (ref 11.2–14.5)
WBC: 8.4 10*3/uL (ref 3.9–10.3)
lymph#: 1.5 10*3/uL (ref 0.9–3.3)

## 2013-04-18 NOTE — Progress Notes (Signed)
Checked in new pt with no financial concerns. °

## 2013-04-18 NOTE — Progress Notes (Signed)
Radiation Oncology         [phone] ________________________________  Initial outpatient Consultation  Name: Cristina Douglas MRN: [id number]  Date: 04/18/2013  DOB: [birth year]  EX:HBZJIR, Lennette Bihari, MD  Adin Hector, MD   REFERRING PHYSICIAN: Adin Hector, MD  DIAGNOSIS: High-grade intraductal carcinoma of the right breast presenting in the lower outer quadrant  HISTORY OF PRESENT ILLNESS::Cristina Douglas is a 78 y.o. female who is seen out of the courtesy of Dr. Fanny Skates as part of the multidisciplinary breast clinic. Earlier this year on routine screening mammography the patient was noted to have some calcifications within the lower outer quadrant of the right breast. Additional imaging documented below confirmed these suspicious calcifications. Patient proceeded to undergo biopsy of this area which revealed a high-grade ductal carcinoma in situ with necrosis and calcifications. The tumor was estrogen receptor positive at 100% and progesterone receptor positive at 34%. An MRI was performed which demonstrated a clumped nodular area of enhancement in the biopsy site. No other lesions were noted on MRI. With this information the patient is now seen as part of the multi- disciplinary breast clinic for evaluation.Marland Kitchen  PREVIOUS RADIATION THERAPY: No  PAST MEDICAL HISTORY:  has a past medical history of Unspecified essential hypertension; Other and unspecified hyperlipidemia; Mitral valve insufficiency and aortic valve insufficiency; Hypothyroidism; Cataract; and Basal cell carcinoma.    PAST SURGICAL HISTORY: Past Surgical History  Procedure Laterality Date  . Tonsillectomy    . Tubal ligation    . Appendectomy      FAMILY HISTORY: family history includes Lung cancer in her paternal aunt.  SOCIAL HISTORY:  reports that she has never smoked. She has never used smokeless tobacco. She reports that she drinks alcohol. She reports that she does not use illicit  drugs.  ALLERGIES: Sulfa antibiotics  MEDICATIONS:  Current Outpatient Prescriptions  Medication Sig Dispense Refill  . aspirin 81 MG tablet Take 81 mg by mouth daily.        Marland Kitchen atorvastatin (LIPITOR) 80 MG tablet Take 1 tablet by mouth Daily.      . Calcium Carbonate-Vitamin D (CALTRATE 600+D) 600-400 MG-UNIT per tablet Take 1 tablet by mouth 2 (two) times daily.       . cholecalciferol (VITAMIN D) 1000 UNITS tablet Take 1,000 Units by mouth daily.        . Coenzyme Q10 (CO Q 10) 100 MG CAPS Take 200 mg by mouth daily.      Marland Kitchen glucosamine-chondroitin 500-400 MG tablet Take 1 tablet by mouth daily.        Marland Kitchen levothyroxine (SYNTHROID, LEVOTHROID) 88 MCG tablet Take 88 mcg by mouth daily.        Marland Kitchen losartan-hydrochlorothiazide (HYZAAR) 50-12.5 MG per tablet Take 1 tablet by mouth 2 (two) times daily.        Marland Kitchen MAGNESIUM OXIDE PO Take 1 tablet by mouth daily.        . metFORMIN (GLUCOPHAGE-XR) 500 MG 24 hr tablet Take 1 tablet by mouth Twice daily.      . metoprolol tartrate (LOPRESSOR) 25 MG tablet TAKE 1 TABLET BY MOUTH TWICE DAILY  180 tablet  2  . metoprolol tartrate (LOPRESSOR) 25 MG tablet Take 25 mg by mouth 2 (two) times daily. 1/2 pill in morning and 1/2 pill at night.      . Multiple Vitamin (MULTIVITAMIN) tablet Take 1 tablet by mouth daily.        . Probiotic Product (ALIGN PO) Take 1 capsule  by mouth daily.        Marland Kitchen UNABLE TO FIND Take by mouth.       No current facility-administered medications for this encounter.    REVIEW OF SYSTEMS:  A 15 point review of systems is documented in the electronic medical record. This was obtained by the nursing staff. However, I reviewed this with the patient to discuss relevant findings and make appropriate changes.  Prior to biopsy the patient denied any pain in the left or right breast area. She denied any nipple discharge or bleeding. She denies any new bony pain headaches dizziness or blurred vision.   PHYSICAL EXAM: Vitals - 1 value per visit  2/84/1324  SYSTOLIC 401  DIASTOLIC 87  Pulse 64  Temperature 98.4  Respirations 18  Weight (lb) 174.4  Height 5\' 3"   BMI 30.9  VISIT REPORT      Gen. this is a healthy-appearing 78 year old female in no acute distress. She is accompanied by her husband and daughter on evaluation today. Examination of the neck and supraclavicular region reveals no evidence of adenopathy. The axillary areas are free of adenopathy.  examination of the lungs reveals them to be clear. The heart is regular rhythm and rate. Examination of the left breast reveals no mass or nipple discharge. Examination of the right breast area reveals some bruising in the  5:30 position of the right breast. There is a small biopsy site noted. No dominant masses appreciated breast nipple discharge or bleeding. On neurological examination motor strength is 5 out of 5 in the proximal and distal muscle groups in the upper and lower extremities.  ECOG = 0  0 - Asymptomatic (Fully active, able to carry on all predisease activities without restriction)   LABORATORY DATA:  Lab Results  Component Value Date   WBC 8.4 04/18/2013   HGB 13.7 04/18/2013   HCT 41.3 04/18/2013   MCV 87.0 04/18/2013   PLT 326 04/18/2013   NEUTROABS 5.8 04/18/2013   Lab Results  Component Value Date   NA 138 04/18/2013   K 3.6 04/18/2013   CO2 30* 04/18/2013   GLUCOSE 108 04/18/2013   CREATININE 0.8 04/18/2013   CALCIUM 10.2 04/18/2013      RADIOGRAPHY: Mr Breast Bilateral W Wo Contrast  04/13/2013   CLINICAL DATA:  Recently biopsy proven right lower outer quadrant DCIS manifesting as mammographically detected calcifications.  LABS:  BUN and creatinine were obtained on site at [place] at  [address]  Results:  BUN 11 mg/dL,  Creatinine 0.8 mg/dL.  EXAM: BILATERAL BREAST MRI WITH AND WITHOUT CONTRAST  TECHNIQUE: Multiplanar, multisequence MR images of both breasts were obtained prior to and following the intravenous administration of 53ml  of MultiHance.  THREE-DIMENSIONAL MR IMAGE RENDERING ON INDEPENDENT WORKSTATION:  Three-dimensional MR images were rendered by post-processing of the original MR data on an independent workstation. The three-dimensional MR images were interpreted, and findings are reported in the following complete MRI report for this study. Three dimensional images were evaluated at the independent DynaCad workstation  COMPARISON:  Previous exams  FINDINGS: Breast composition: b.  Scattered fibroglandular tissue.  Background parenchymal enhancement: Minimal  Right breast: In the right lower outer quadrant, containing clip artifact, is an area of clumped nodular enhancement corresponding to the recently biopsy proven DCIS, measuring 2.5 x 2.2 x 0.9 cm. This demonstrates plateau type enhancement kinetics.  Left breast: No mass or abnormal enhancement.  Lymph nodes: No abnormal appearing lymph nodes.  Ancillary findings:  None.  IMPRESSION: 2.5 cm area of clumped nodular enhancement corresponding to the biopsy proven right lower outer quadrant DCIS. No MRI evidence for multifocal/multicentric or contralateral malignancy.  RECOMMENDATION: Treatment plan  BI-RADS CATEGORY  6: Known biopsy-proven malignancy - appropriate action should be taken.   Electronically Signed   By: Conchita Paris M.D.   On: 04/13/2013 11:48   Mm Digital Diagnostic Unilat R  04/04/2013   CLINICAL DATA:  Recall from screening mammogram.  EXAM: DIGITAL DIAGNOSTIC  right breast MAMMOGRAM  COMPARISON:  03/22/2013, 03/02/2012, 01/28/2011, 09/10/2010, 01/30/2010, 07/09/2009, 12/20/2008.  ACR Breast Density Category b: There are scattered areas of fibroglandular density.  FINDINGS: There are pleomorphic calcifications located within the lower outer quadrant of the right breast with linear forms present. These are in a linear distribution and extend for maximally 2.6 cm. Tissue sampling is recommended. I have discussed stereotactic core biopsy with the patient.   IMPRESSION: Suspicious pleomorphic calcifications located within the lower outer quadrant of the right breast which extend for 2.6 cm. Tissue sampling is recommended and stereotactic core biopsy will be scheduled.  RECOMMENDATION: Right breast stereotactic core biopsy. This is scheduled for 04/11/2013.  I have discussed the findings and recommendations with the patient. Results were also provided in writing at the conclusion of the visit. If applicable, a reminder letter will be sent to the patient regarding the next appointment.  BI-RADS CATEGORY  4: Suspicious abnormality - biopsy should be considered.   Electronically Signed   By: Luberta Robertson M.D.   On: 04/04/2013 12:23   Mm Radiologist Eval And Mgmt  04/11/2013   EXAM: ESTABLISHED PATIENT OFFICE VISIT - LEVEL II [phone])  CHIEF COMPLAINT: Patient returns for wound site check and results following stereotactic biopsy of the right breast.  HISTORY OF PRESENT ILLNESS: Patient had an abnormal screening mammogram on 03/22/2013. Diagnostic workup on 04/05/2003 showed suspicious calcifications in the right breast. Stereotactic biopsy was recommended. The stereotactic biopsy was performed on 04/10/2013.  EXAM: The wound site is clean and dry with no hematoma or signs of infection.  PATHOLOGY: High-grade ductal carcinoma in situ with necrosis and calcifications was reported histologically. This corresponds well with the imaging findings.  ASSESSMENT AND PLAN: ASSESSMENT AND PLAN The patient has been scheduled at the Multidisciplinary Clinic on 04/18/2013. Breast MRI has been scheduled on 04/13/2013. The results of the biopsy were discussed with the patient and her husband. Questions were answered. Informational packet was given.   Electronically Signed   By: Lillia Mountain M.D.   On: 04/11/2013 14:29   Mm Screening Breast Tomo Bilateral  03/23/2013   CLINICAL DATA:  Screening.  EXAM: DIGITAL SCREENING BILATERAL MAMMOGRAM WITH 3D TOMO WITH CAD  COMPARISON:  Previous  Exam(s)  ACR Breast Density Category b: There are scattered areas of fibroglandular density.  FINDINGS: In the right breast, calcifications warrant further evaluation with magnified views. In the left breast, no findings suspicious for malignancy. Images were processed with CAD.  IMPRESSION: Further evaluation is suggested for calcifications in the right breast.  RECOMMENDATION: Diagnostic mammogram of the right breast. (Code:[id number])  The patient will be contacted regarding the findings, and additional imaging will be scheduled.  BI-RADS CATEGORY  0: Incomplete. Need additional imaging evaluation and/or prior mammograms for comparison.   Electronically Signed   By: Enrique Sack M.D.   On: 03/23/2013 16:50   Mm Rt Breast Bx W Loc Dev 1st Lesion Image Bx Spec Stereo Guide  04/10/2013   CLINICAL DATA:  Right  breast calcifications.  EXAM: RIGHT STEREOTACTIC CORE NEEDLE BIOPSY  COMPARISON:  Previous exams.  FINDINGS: The patient and I discussed the procedure of stereotactic-guided biopsy including benefits and alternatives. We discussed the high likelihood of a successful procedure. We discussed the risks of the procedure including infection, bleeding, tissue injury, clip migration, and inadequate sampling. Informed written consent was given. The usual time out protocol was performed immediately prior to the procedure.  Using sterile technique and 2% lidocaine and 2% lidocaine with epinephrine as local anesthetic, under stereotactic guidance, a 9 gauge vacuum assisted device was used to perform core needle biopsy of calcifications in the lower outer quadrant using an inferior approach. Specimen radiograph was performed showing calcifications are present in the tissue sample. Specimens with calcifications are identified for pathology.  At the conclusion of the procedure, a coil shaped tissue marker clip was deployed into the biopsy cavity. Follow-up 2-view mammogram confirmed clip placement.  IMPRESSION:  Stereotactic-guided biopsy of the right breast. No apparent complications.   Electronically Signed   By: Lillia Mountain M.D.   On: 04/10/2013 14:31      IMPRESSION: High-grade intraductal carcinoma of the right breast. Patient would be a good candidate for breast conservation with partial mastectomy followed by radiation therapy. Discussed with patient that she may be a candidate for adjuvant hormonal therapy alone after her surgery.   at this time the patient wishes to be aggressive with her management and would l like to proceed with the adjuvant radiation therapy and hormonal therapy after her planned surgery. We did discuss the mastectomy as part of her management patient is motivated for breast conservation.  PLAN: Patient will be scheduled for her needle localized partial mastectomy. In the postoperative setting the patient will be seen for reevaluation. She does live in [place] and the like to receive her radiation therapy at the [facility] in [place]. I will see her in the post operative setting for further evaluation and to set up her radiation treatments in [place]. She may be a candidate for accelerated treatment depending on her overall set up.  Total time spent in this encounter was 60 minutes with counseling, evaluation of imaging studies,  examination and coordination of care.   ------------------------------------------------  Blair Promise, PhD, MD

## 2013-04-18 NOTE — Patient Instructions (Signed)
Your recent needle biopsy of the right breast shows high-grade ductal carcinoma in situ, estrogen receptor positive. Your MRI shows no other abnormality. This is a localized, solitary finding.  We have talked about all of your options for treatment. You have decided that he would like to proceed with breast conservation surgery. I agree that that is appropriate.  You will be scheduled for right partial mastectomy with radioactive seed localization in the near future.  There does not seem to be any indication for lymph node biopsy.  Dr. Darrel Hoover office will call you tomorrow to begin the scheduling process.   Lumpectomy A lumpectomy is a form of "breast conserving" or "breast preservation" surgery. It may also be referred to as a partial mastectomy. During a lumpectomy, the portion of the breast that contains the cancerous tumor or breast mass (the lump) is removed. Some normal tissue around the lump may also be removed to make sure all the tumor has been removed. This surgery should take 40 minutes or less. LET [facility] CARE PROVIDER KNOW ABOUT:  Any allergies you have.  All medicines you are taking, including vitamins, herbs, eye drops, creams, and over-the-counter medicines.  Previous problems you or members of your family have had with the use of anesthetics.  Any blood disorders you have.  Previous surgeries you have had.  Medical conditions you have. RISKS AND COMPLICATIONS Generally, this is a safe procedure. However, as with any procedure, complications can occur. Possible complications include:  Bleeding.  Infection.  Pain.  Temporary swelling.  Change in the shape of the breast, particularly if a large portion is removed. BEFORE THE PROCEDURE  Ask your health care provider about changing or stopping your regular medicines.  Do not eat or drink anything for 7 8 hours before the surgery or as directed by your health care provider. Ask your health care provider if you  can take a sip of water with any approved medicines.  On the day of surgery, your healthcare provider will use a mammogram or ultrasound to locate and mark the tumor in your breast. These markings on your breast will show where the cut (incision) will be made. PROCEDURE   An IV tube will be put into one of your veins.  You may be given medicine to help you relax before the surgery (sedative). You will be given one of the following:  A medicine that numbs the area (local anesthesia).  A medicine that makes you go to sleep (general anesthesia).  Your health care provider will use a kind of electric scalpel that uses heat to minimize bleeding (electrocautery knife).  A curved incision (like a smile or frown) that follows the natural curve of your breast is made, to allow for minimal scarring and better healing.  The tumor will be removed with some of the surrounding tissue. This will be sent to the lab for analysis. Your health care provider may also remove your lymph nodes at this time if needed.  Sometimes, but not always, a rubber tube called a drain will be surgically inserted into your breast area or armpit to collect excess fluid that may accumulate in the space where the tumor was. This drain is connected to a plastic bulb on the outside of your body. This drain creates suction to help remove the fluid.  The incisions will be closed with stitches (sutures).  A bandage may be placed over the incisions. AFTER THE PROCEDURE  You will be taken to the recovery area.  You will be given medicine for pain.  A small rubber drain may be placed in the breast for 2 3 days to prevent a collection of blood (hematoma) from developing in the breast. You will be given instructions on caring for the drain before you go home.  A pressure bandage (dressing) will be applied for 1 2 days to prevent bleeding. Ask your health care provider how to care for your bandage at home. Document Released:  02/01/2006 Document Revised: 08/23/2012 Document Reviewed: 05/26/2012 [facility] Patient Information 2014 [place].

## 2013-04-18 NOTE — Progress Notes (Signed)
Patient ID: Cristina Douglas, female   DOB: 03-01-1935, 78 y.o.   MRN: [id number]  No chief complaint on file.  Note: This dictation was prepared with Dragon/digital dictation along with [employer]. Any transcriptional errors that result from this process are unintentional.   HPI Cristina Douglas is a 78 y.o. female.  She is referred by Dr. Miquel Dunn at the breast center [facility]  for evaluation and management of high grade DCIS right breast, lower outer quadrant. Receptor positive.Her PCP is Dr. Rory Percy. Her endocrinologist is Dr. Jacelyn Pi. She is being evaluated in the [facility] today by Dr. Marcy Panning, Dr. Gery Pray, and me.  She has no past history of breast problems. She gets yearly mammograms. Recent mammograms showed a 2.6 cm linear area of microcalcifications in the right breast, lower outer quadrant. Image guided biopsy shows high-grade DCIS with necrosis, ER +100%, PR-positive 34%. MRI shows a 2.5 cm area of an ALT enhancement in the lower outer quadrant of the right breast. This is a solitary finding. Otherwise MRI negative.  Family history is negative for breast or ovarian cancer  Past history reveals borderline non-insulin-dependent diabetes mellitus on metformin, hypertension, hyperlipidemia, chronic palpitations and PVCs, hypothyroidism, and she's had a bilateral tubal ligation. Her performance status is good. She does go to the gym regularly. She denies tobacco or alcohol. She has 3 children. HPI  Past Medical History  Diagnosis Date  . Unspecified essential hypertension   . Other and unspecified hyperlipidemia   . Mitral valve insufficiency and aortic valve insufficiency   . Hypothyroidism   . Cataract   . Basal cell carcinoma     Past Surgical History  Procedure Laterality Date  . Tonsillectomy    . Tubal ligation    . Appendectomy      Family History  Problem Relation Age of Onset  . Lung cancer Paternal Aunt     Social History History   Substance Use Topics  . Smoking status: Never Smoker   . Smokeless tobacco: Never Used  . Alcohol Use: Yes     Comment: occas    Allergies  Allergen Reactions  . Sulfa Antibiotics     Current Outpatient Prescriptions  Medication Sig Dispense Refill  . aspirin 81 MG tablet Take 81 mg by mouth daily.        Marland Kitchen atorvastatin (LIPITOR) 80 MG tablet Take 1 tablet by mouth Daily.      . Calcium Carbonate-Vitamin D (CALTRATE 600+D) 600-400 MG-UNIT per tablet Take 1 tablet by mouth 2 (two) times daily.       . cholecalciferol (VITAMIN D) 1000 UNITS tablet Take 1,000 Units by mouth daily.        . Coenzyme Q10 (CO Q 10) 100 MG CAPS Take 200 mg by mouth daily.      Marland Kitchen glucosamine-chondroitin 500-400 MG tablet Take 1 tablet by mouth daily.        Marland Kitchen levothyroxine (SYNTHROID, LEVOTHROID) 88 MCG tablet Take 88 mcg by mouth daily.        Marland Kitchen losartan-hydrochlorothiazide (HYZAAR) 50-12.5 MG per tablet Take 1 tablet by mouth 2 (two) times daily.        Marland Kitchen MAGNESIUM OXIDE PO Take 1 tablet by mouth daily.        . metFORMIN (GLUCOPHAGE-XR) 500 MG 24 hr tablet Take 1 tablet by mouth Twice daily.      . metoprolol tartrate (LOPRESSOR) 25 MG tablet TAKE 1 TABLET BY MOUTH TWICE DAILY  180 tablet  2  . metoprolol tartrate (LOPRESSOR) 25 MG tablet Take 25 mg by mouth 2 (two) times daily. 1/2 pill in morning and 1/2 pill at night.      . Multiple Vitamin (MULTIVITAMIN) tablet Take 1 tablet by mouth daily.        . Probiotic Product (ALIGN PO) Take 1 capsule by mouth daily.        Marland Kitchen UNABLE TO FIND Take by mouth.       No current facility-administered medications for this visit.    Review of Systems Review of Systems  Constitutional: Negative for fever, chills and unexpected weight change.  HENT: Negative for congestion, hearing loss, sore throat, trouble swallowing and voice change.   Eyes: Negative for visual disturbance.  Respiratory: Negative for cough and wheezing.   Cardiovascular: Positive for  palpitations. Negative for chest pain and leg swelling.  Gastrointestinal: Negative for nausea, vomiting, abdominal pain, diarrhea, constipation, blood in stool, abdominal distention and anal bleeding.  Genitourinary: Negative for hematuria, vaginal bleeding and difficulty urinating.  Musculoskeletal: Negative for arthralgias.  Skin: Negative for rash and wound.  Neurological: Negative for seizures, syncope and headaches.  Hematological: Negative for adenopathy. Does not bruise/bleed easily.  Psychiatric/Behavioral: Negative for confusion.    There were no vitals taken for this visit.  Physical Exam Physical Exam  Constitutional: She is oriented to person, place, and time. She appears well-developed and well-nourished. No distress.  HENT:  Head: Normocephalic and atraumatic.  Nose: Nose normal.  Mouth/Throat: No oropharyngeal exudate.  Eyes: Conjunctivae and EOM are normal. Pupils are equal, round, and reactive to light. Left eye exhibits no discharge. No scleral icterus.  Neck: Neck supple. No JVD present. No tracheal deviation present. No thyromegaly present.  Cardiovascular: Normal rate, regular rhythm, normal heart sounds and intact distal pulses.   No murmur heard. Pulmonary/Chest: Effort normal and breath sounds normal. No respiratory distress. She has no wheezes. She has no rales. She exhibits no tenderness.  There is a small bruise in the right breast, lower outer quadrant. No palpable mass in either breast. Nipple and areolar complexes looked normal without drainage. No axillary adenopathy.  Abdominal: Soft. Bowel sounds are normal. She exhibits no distension and no mass. There is no tenderness. There is no rebound and no guarding.  Musculoskeletal: She exhibits no edema and no tenderness.  Lymphadenopathy:    She has no cervical adenopathy.  Neurological: She is alert and oriented to person, place, and time. She exhibits normal muscle tone. Coordination normal.  Skin: Skin is  warm. No rash noted. She is not diaphoretic. No erythema. No pallor.  Psychiatric: She has a normal mood and affect. Her behavior is normal. Judgment and thought content normal.    Data Reviewed I have reviewed her imaging studies, histology and breast diagnostic profile. I discussed her case at breast conference this morning. I have coordinated her treatment plan without calcium content and Dr. Gery Pray.  Assessment    High-grade DCIS with necrosis, right breast, lower outer quadrant, 2.6 cm area, a solitary finding.  Hypertension  Non-insulin-dependent diabetes mellitus  Hyperlipidemia  PVCs  Hypothyroidism     Plan    We had a long discussion about options. We discussed the options of partial mastectomy and total mastectomy. We talked about radiation therapy. We told her that we did not think sentinel lymph node biopsy was indicated in her case. She is very much motivated for breast conservation, and I think she is a good candidate for that.  She'll be scheduled for right partial mastectomy with radioactive seed localization in the future.  I discussed the indications, details, techniques, and numerous risks of the surgery with her. She's aware of the risk of bleeding, infection, reoperation for positive margins, cosmetic deformity, skin necrosis, nerve damage with chronic pain or numbness, cardiac, pulmonary and thromboembolic problems. She understands these issues well. At this time for questions are answered. She agrees with this plan.       Edsel Petrin. Dalbert Batman, M.D., [facility] General and Minimally invasive Surgery Breast and Colorectal Surgery Office:   [phone] Pager:   [phone]  04/18/2013, 2:59 PM

## 2013-04-19 ENCOUNTER — Other Ambulatory Visit (INDEPENDENT_AMBULATORY_CARE_PROVIDER_SITE_OTHER): Payer: Self-pay | Admitting: General Surgery

## 2013-04-19 DIAGNOSIS — C50511 Malignant neoplasm of lower-outer quadrant of right female breast: Secondary | ICD-10-CM

## 2013-04-19 NOTE — Progress Notes (Signed)
[facility] Psychosocial Distress Screening Clinical Social Work  Clinical Social Work met with Patient, Patient's husband and daughter at breast clinic.  The patient scored a 5 on the Psychosocial Distress Thermometer which indicates moderate distress. Clinical Social Worker Intern assessed for distress and other psychosocial needs. Patient was given written information about Patient and Family Support services.  Patient agrees to seek out further assistance if needed.   Clinical Social Worker follow up needed: no  If yes, follow up plan:   Mazy Culton S. Shickley Work Intern [employer] [phone]

## 2013-04-21 NOTE — Progress Notes (Signed)
Cristina Douglas [id number] 1935/08/24 78 y.o. 04/21/2013 12:14 AM  CC  Rory Percy, MD [place] [address]  REASON FOR CONSULTATION:  79 year old female with new diagnosis of ductal carcinoma in situ of the right breast. Patient is seen in the multidisciplinary breast clinic for discussion of treatment options  STAGE:   Breast cancer of lower-outer quadrant of right female breast   Primary site: Breast (Right)   Staging method: AJCC 7th Edition   Clinical: Stage 0 (Tis (DCIS), N0, cM0)   Summary: Stage 0 (Tis (DCIS), N0, cM0)  REFERRING PHYSICIAN: Dr. Fanny Skates  HISTORY OF PRESENT ILLNESS:  Cristina Douglas is a 78 y.o. female.  Who underwent a screening mammogram and was noted to have calcifications in the lower outer quadrant of the right breast. These were suspicious and therefore biopsy was recommended. The biopsy revealed high-grade ductal carcinoma in situ with necrosis and calcifications. Tumor was ER +100% PR +34%. She did have a MRI performed that showed clumped nodular area of enhancement no other lesions in either of the breasts. Patient's case was discussed in the multidisciplinary breast conference. Radiology and pathology were reviewed. She is without any complaints.   Past Medical History: Past Medical History  Diagnosis Date  . Unspecified essential hypertension   . Other and unspecified hyperlipidemia   . Mitral valve insufficiency and aortic valve insufficiency   . Hypothyroidism   . Cataract   . Basal cell carcinoma     Past Surgical History: Past Surgical History  Procedure Laterality Date  . Tonsillectomy    . Tubal ligation    . Appendectomy      Family History: Family History  Problem Relation Age of Onset  . Lung cancer Paternal Aunt     Social History History  Substance Use Topics  . Smoking status: Never Smoker   . Smokeless tobacco: Never Used  . Alcohol Use: Yes     Comment: occas    Allergies: Allergies   Allergen Reactions  . Sulfa Antibiotics     Current Medications: Current Outpatient Prescriptions  Medication Sig Dispense Refill  . aspirin 81 MG tablet Take 81 mg by mouth daily.        Marland Kitchen atorvastatin (LIPITOR) 80 MG tablet Take 1 tablet by mouth Daily.      . Calcium Carbonate-Vitamin D (CALTRATE 600+D) 600-400 MG-UNIT per tablet Take 1 tablet by mouth 2 (two) times daily.       . cholecalciferol (VITAMIN D) 1000 UNITS tablet Take 1,000 Units by mouth daily.        . Coenzyme Q10 (CO Q 10) 100 MG CAPS Take 200 mg by mouth daily.      Marland Kitchen glucosamine-chondroitin 500-400 MG tablet Take 1 tablet by mouth daily.        Marland Kitchen levothyroxine (SYNTHROID, LEVOTHROID) 88 MCG tablet Take 88 mcg by mouth daily.        Marland Kitchen losartan-hydrochlorothiazide (HYZAAR) 50-12.5 MG per tablet Take 1 tablet by mouth 2 (two) times daily.        Marland Kitchen MAGNESIUM OXIDE PO Take 1 tablet by mouth daily.        . metFORMIN (GLUCOPHAGE-XR) 500 MG 24 hr tablet Take 1 tablet by mouth Twice daily.      . metoprolol tartrate (LOPRESSOR) 25 MG tablet Take 25 mg by mouth 2 (two) times daily. 1/2 pill in morning and 1/2 pill at night.      . Multiple Vitamin (MULTIVITAMIN) tablet Take 1  tablet by mouth daily.        . Probiotic Product (ALIGN PO) Take 1 capsule by mouth daily.        Marland Kitchen UNABLE TO FIND Take by mouth.      . metoprolol tartrate (LOPRESSOR) 25 MG tablet TAKE 1 TABLET BY MOUTH TWICE DAILY  180 tablet  2   No current facility-administered medications for this visit.    OB/GYN History: Menarche at age 65 she underwent menopause in her late 80s. First live birth at 89. 5 years of hormone replacement therapy.   ECOG PERFORMANCE STATUS: 0 - Asymptomatic  Genetic Counseling/testing: no  REVIEW OF SYSTEMS:  A comprehensive review of systems was negative.  PHYSICAL EXAMINATION: Blood pressure 155/87, pulse 64, temperature 98.4 F (36.9 C), temperature source Oral, resp. rate 18, height 5\' 3"  (1.6 m), weight 174 lb 6.4  oz (79.107 kg).  General:  well-nourished in no acute distress.  Eyes:  no scleral icterus.  ENT:  There were no oropharyngeal lesions.  Neck was without thyromegaly.  Lymphatics:  Negative cervical, supraclavicular or axillary adenopathy.  Respiratory: lungs were clear bilaterally without wheezing or crackles.  Cardiovascular:  Regular rate and rhythm, S1/S2, without murmur, rub or gallop.  There was no pedal edema.  GI:  abdomen was soft, flat, nontender, nondistended, without organomegaly.  Muscoloskeletal:  no spinal tenderness of palpation of vertebral spine.  Skin exam was without echymosis, petichae.  Neuro exam was nonfocal.  Patient was able to get on and off exam table without assistance.  Gait was normal.  Patient was alerted and oriented.  Attention was good.   Language was appropriate.  Mood was normal without depression.  Speech was not pressured.  Thought content was not tangential.   Breasts: right breast normal without mass, skin or nipple changes or axillary nodes, left breast normal without mass, skin or nipple changes or axillary nodes.   STUDIES/RESULTS: Mr Breast Bilateral W Wo Contrast  04/13/2013   CLINICAL DATA:  Recently biopsy proven right lower outer quadrant DCIS manifesting as mammographically detected calcifications.  LABS:  BUN and creatinine were obtained on site at [place] at  [address]  Results:  BUN 11 mg/dL,  Creatinine 0.8 mg/dL.  EXAM: BILATERAL BREAST MRI WITH AND WITHOUT CONTRAST  TECHNIQUE: Multiplanar, multisequence MR images of both breasts were obtained prior to and following the intravenous administration of 22ml of MultiHance.  THREE-DIMENSIONAL MR IMAGE RENDERING ON INDEPENDENT WORKSTATION:  Three-dimensional MR images were rendered by post-processing of the original MR data on an independent workstation. The three-dimensional MR images were interpreted, and findings are reported in the following complete MRI report for this study. Three  dimensional images were evaluated at the independent DynaCad workstation  COMPARISON:  Previous exams  FINDINGS: Breast composition: b.  Scattered fibroglandular tissue.  Background parenchymal enhancement: Minimal  Right breast: In the right lower outer quadrant, containing clip artifact, is an area of clumped nodular enhancement corresponding to the recently biopsy proven DCIS, measuring 2.5 x 2.2 x 0.9 cm. This demonstrates plateau type enhancement kinetics.  Left breast: No mass or abnormal enhancement.  Lymph nodes: No abnormal appearing lymph nodes.  Ancillary findings:  None.  IMPRESSION: 2.5 cm area of clumped nodular enhancement corresponding to the biopsy proven right lower outer quadrant DCIS. No MRI evidence for multifocal/multicentric or contralateral malignancy.  RECOMMENDATION: Treatment plan  BI-RADS CATEGORY  6: Known biopsy-proven malignancy - appropriate action should be taken.   Electronically Signed  By: Conchita Paris M.D.   On: 04/13/2013 11:48   Mm Digital Diagnostic Unilat R  04/04/2013   CLINICAL DATA:  Recall from screening mammogram.  EXAM: DIGITAL DIAGNOSTIC  right breast MAMMOGRAM  COMPARISON:  03/22/2013, 03/02/2012, 01/28/2011, 09/10/2010, 01/30/2010, 07/09/2009, 12/20/2008.  ACR Breast Density Category b: There are scattered areas of fibroglandular density.  FINDINGS: There are pleomorphic calcifications located within the lower outer quadrant of the right breast with linear forms present. These are in a linear distribution and extend for maximally 2.6 cm. Tissue sampling is recommended. I have discussed stereotactic core biopsy with the patient.  IMPRESSION: Suspicious pleomorphic calcifications located within the lower outer quadrant of the right breast which extend for 2.6 cm. Tissue sampling is recommended and stereotactic core biopsy will be scheduled.  RECOMMENDATION: Right breast stereotactic core biopsy. This is scheduled for 04/11/2013.  I have discussed the findings and  recommendations with the patient. Results were also provided in writing at the conclusion of the visit. If applicable, a reminder letter will be sent to the patient regarding the next appointment.  BI-RADS CATEGORY  4: Suspicious abnormality - biopsy should be considered.   Electronically Signed   By: Luberta Robertson M.D.   On: 04/04/2013 12:23   Mm Radiologist Eval And Mgmt  04/11/2013   EXAM: ESTABLISHED PATIENT OFFICE VISIT - LEVEL II [phone])  CHIEF COMPLAINT: Patient returns for wound site check and results following stereotactic biopsy of the right breast.  HISTORY OF PRESENT ILLNESS: Patient had an abnormal screening mammogram on 03/22/2013. Diagnostic workup on 04/05/2003 showed suspicious calcifications in the right breast. Stereotactic biopsy was recommended. The stereotactic biopsy was performed on 04/10/2013.  EXAM: The wound site is clean and dry with no hematoma or signs of infection.  PATHOLOGY: High-grade ductal carcinoma in situ with necrosis and calcifications was reported histologically. This corresponds well with the imaging findings.  ASSESSMENT AND PLAN: ASSESSMENT AND PLAN The patient has been scheduled at the Multidisciplinary Clinic on 04/18/2013. Breast MRI has been scheduled on 04/13/2013. The results of the biopsy were discussed with the patient and her husband. Questions were answered. Informational packet was given.   Electronically Signed   By: Lillia Mountain M.D.   On: 04/11/2013 14:29   Mm Screening Breast Tomo Bilateral  03/23/2013   CLINICAL DATA:  Screening.  EXAM: DIGITAL SCREENING BILATERAL MAMMOGRAM WITH 3D TOMO WITH CAD  COMPARISON:  Previous Exam(s)  ACR Breast Density Category b: There are scattered areas of fibroglandular density.  FINDINGS: In the right breast, calcifications warrant further evaluation with magnified views. In the left breast, no findings suspicious for malignancy. Images were processed with CAD.  IMPRESSION: Further evaluation is suggested for  calcifications in the right breast.  RECOMMENDATION: Diagnostic mammogram of the right breast. (Code:[id number])  The patient will be contacted regarding the findings, and additional imaging will be scheduled.  BI-RADS CATEGORY  0: Incomplete. Need additional imaging evaluation and/or prior mammograms for comparison.   Electronically Signed   By: Enrique Sack M.D.   On: 03/23/2013 16:50   Mm Rt Breast Bx W Loc Dev 1st Lesion Image Bx Spec Stereo Guide  04/10/2013   CLINICAL DATA:  Right breast calcifications.  EXAM: RIGHT STEREOTACTIC CORE NEEDLE BIOPSY  COMPARISON:  Previous exams.  FINDINGS: The patient and I discussed the procedure of stereotactic-guided biopsy including benefits and alternatives. We discussed the high likelihood of a successful procedure. We discussed the risks of the procedure including infection, bleeding, tissue injury, clip  migration, and inadequate sampling. Informed written consent was given. The usual time out protocol was performed immediately prior to the procedure.  Using sterile technique and 2% lidocaine and 2% lidocaine with epinephrine as local anesthetic, under stereotactic guidance, a 9 gauge vacuum assisted device was used to perform core needle biopsy of calcifications in the lower outer quadrant using an inferior approach. Specimen radiograph was performed showing calcifications are present in the tissue sample. Specimens with calcifications are identified for pathology.  At the conclusion of the procedure, a coil shaped tissue marker clip was deployed into the biopsy cavity. Follow-up 2-view mammogram confirmed clip placement.  IMPRESSION: Stereotactic-guided biopsy of the right breast. No apparent complications.   Electronically Signed   By: Lillia Mountain M.D.   On: 04/10/2013 14:31     LABS:    Chemistry      Component Value Date/Time   NA 138 04/18/2013 1233   K 3.6 04/18/2013 1233   CO2 30* 04/18/2013 1233   BUN 13.7 04/18/2013 1233   CREATININE 0.8 04/18/2013 1233       Component Value Date/Time   CALCIUM 10.2 04/18/2013 1233   ALKPHOS 60 04/18/2013 1233   AST 24 04/18/2013 1233   ALT 18 04/18/2013 1233   BILITOT 0.60 04/18/2013 1233      Lab Results  Component Value Date   WBC 8.4 04/18/2013   HGB 13.7 04/18/2013   HCT 41.3 04/18/2013   MCV 87.0 04/18/2013   PLT 326 04/18/2013     ASSESSMENT/PLAN   78 year old female with   1. Stage 0 (TisNx) high grade DCIS of the right/left breast found on screening mammogram. DCIS iThe DCIS was ER+/PR+   2. We spent the better part of today's hour-long appointment discussing the biology of breast cancer in general, and the specifics of the patient's tumor in particular. We discussed the multidisciplinary approach to breast cancer treatment. We went over her pathology. She understands that her cancer is a non invasive disease. Patient will undergo lumpectomy. We discussed rationale for this. She will also need radiation therapy. She was seen by radiation oncology today. Patient and I discussed role of antiestrogen therapy as a chemopreventive in DCIS. We discussed risks benefits and side effects. She demonstrated understanding. She realizes that this would start after her radiation is completed.  3. Patient will proceed with  and I will see her back in 3-4 in follow up.    Thank you so much for allowing me to participate in the care of [employer]. I will continue to follow up the patient with you and assist in her care.  All questions were answered. The patient knows to call the clinic with any problems, questions or concerns. We can certainly see the patient much sooner if necessary.  I spent 40 minutes counseling the patient face to face. The total time spent in the appointment was 55 minutes.  Marcy Panning, MD Medical/Oncology [facility] [place] [phone] (beeper) [phone] (Office)

## 2013-04-23 ENCOUNTER — Telehealth: Payer: Self-pay | Admitting: *Deleted

## 2013-04-23 NOTE — Telephone Encounter (Signed)
Patient called and left a voice message regarding her treatment plan. She has several questions for Dr. Humphrey Rolls to answer before she decides how to proceed. She stated, " I have questions about a lumpectomy, mastectomy, genetic testing, radiation seeds and oral chemotherapy drugs. Until Dr. Humphrey Rolls answers these questions I'm not doing anything." Return number is [phone].

## 2013-04-24 ENCOUNTER — Telehealth (INDEPENDENT_AMBULATORY_CARE_PROVIDER_SITE_OTHER): Payer: Self-pay

## 2013-04-24 NOTE — Telephone Encounter (Signed)
Patient calling into office with several questions concerning her upcoming surgery.  Patient would like a call to discuss in further detail.  Please call patient at either he home [phone] or mobile [phone].

## 2013-04-25 NOTE — Telephone Encounter (Signed)
Patient called with further questions. We talked about the size of her tumor related to the size of her breast. We talked about lumpectomy and mastectomy. We talked about reexcision of margins if we get a positive margin. We talked about Genetic testing, which does not seem indicated in her case. She seemed quite reassured after we had this discussion and is ready to go ahead with her breast conservation surgery.   Edsel Petrin. Dalbert Batman, M.D., [facility] General and Minimally invasive Surgery Breast and Colorectal Surgery Office:   [phone] Pager:   [phone]

## 2013-04-26 ENCOUNTER — Telehealth: Payer: Self-pay | Admitting: Oncology

## 2013-04-26 NOTE — Telephone Encounter (Signed)
s/w pt husband re appt for 6/11 w/LC.

## 2013-04-27 ENCOUNTER — Telehealth: Payer: Self-pay | Admitting: *Deleted

## 2013-04-27 ENCOUNTER — Encounter: Payer: Self-pay | Admitting: *Deleted

## 2013-04-27 NOTE — Telephone Encounter (Signed)
Called and spoke with patient from [facility] 04/18/13.  Informed her that Dr. Humphrey Rolls is out on an unexpected leave of absence and she will be seeing Mendel Ryder, NP. Informed her we may need to change that appointment but we would call her if it needs to change.  Encouraged patient to call with any needs.

## 2013-04-27 NOTE — Progress Notes (Signed)
[place] Psychosocial Distress Screening Clinical Social Work  Clinical Social Work was referred by distress screening protocol.  The patient scored a 5 on the Psychosocial Distress Thermometer which indicates moderate distress. Pt was seen by Clinical Social Worker intern on 04/18/13 to address distress screen and to assess for distress and other psychosocial needs.   ONCBCN DISTRESS SCREENING 04/18/2013  Screening Type Initial Screening  Cristina Douglas the number that describes how much distress you have been experiencing in the past week 5  Emotional problem type Adjusting to illness  Information Concerns Type Lack of info about diagnosis;Lack of info about treatment  Physician notified of physical symptoms Yes  Referral to clinical psychology No  Referral to clinical social work No  Referral to dietition No  Referral to financial advocate No  Referral to support programs No  Referral to palliative care No   Clinical Social Worker follow up needed: no  Loren Racer, [place] [address] [place] for [employer] Wednesday, Thursday and Friday Phone: [phone] Fax: [phone]

## 2013-04-30 ENCOUNTER — Telehealth (INDEPENDENT_AMBULATORY_CARE_PROVIDER_SITE_OTHER): Payer: Self-pay

## 2013-04-30 ENCOUNTER — Telehealth: Payer: Self-pay | Admitting: *Deleted

## 2013-04-30 NOTE — Telephone Encounter (Signed)
Patient calling into office with questions regarding her upcoming NL Lumpectomy.  Patient advised to write down all questions she may have for her Oncology Appointment.  Patient verbalized understanding.

## 2013-04-30 NOTE — Telephone Encounter (Signed)
Called and spoke with patient to inform her that Dr. Humphrey Rolls is on an unexpected leave of absence and needed to change her appointment.  Confirmed new appointment for 07/09/13 at 12N.  Encouraged patient to call with any needs or concerns.

## 2013-05-03 ENCOUNTER — Encounter (HOSPITAL_BASED_OUTPATIENT_CLINIC_OR_DEPARTMENT_OTHER): Payer: Self-pay | Admitting: *Deleted

## 2013-05-03 NOTE — Progress Notes (Signed)
Pt going for seeds 5/1=will go for cxr dr Dalbert Batman ordered-had labs 04/18/13-ekg-7/14-sees [facility] for pvc-controlled

## 2013-05-04 ENCOUNTER — Ambulatory Visit
Admission: RE | Admit: 2013-05-04 | Discharge: 2013-05-04 | Disposition: A | Discharge status: Home / Self Care | Payer: Medicare Other | Source: Ambulatory Visit | Attending: General Surgery | Admitting: General Surgery

## 2013-05-04 ENCOUNTER — Inpatient Hospital Stay: Admission: RE | Admit: 2013-05-04 | Payer: 59 | Source: Ambulatory Visit

## 2013-05-04 DIAGNOSIS — C50511 Malignant neoplasm of lower-outer quadrant of right female breast: Secondary | ICD-10-CM

## 2013-05-07 NOTE — H&P (Signed)
Cristina Douglas   MRN:  [id number]   Description: 78 year old female  Provider: Adin Hector, MD  Department: [facility]          Diagnoses      Breast cancer of lower-outer quadrant of right female breast    -  Primary      174.5         History and Physical    Adin Hector, MD     Status: Signed            Patient ID: Cristina Douglas, female   DOB: 1935/03/11, 78 y.o.   MRN: [id number]            Note:  This dictation was prepared with Dragon/digital dictation along with [facility] technology. Any transcriptional errors that result from this process are unintentional.   HPI [name] is a 78 y.o. female.  She is referred by Dr. Miquel Dunn at the breast center [facility]  for evaluation and management of high grade DCIS right breast, lower outer quadrant. Receptor positive.Her PCP is Dr. Rory Percy. Her endocrinologist is Dr. Jacelyn Pi. She is being evaluated in the [facility] today by Dr. Marcy Panning, Dr. Gery Pray, and me.   She has no past history of breast problems. She gets yearly mammograms. Recent mammograms showed a 2.6 cm linear area of microcalcifications in the right breast, lower outer quadrant. Image guided biopsy shows high-grade DCIS with necrosis, ER +100%, PR-positive 34%. MRI shows a 2.5 cm area of an ALT enhancement in the lower outer quadrant of the right breast. This is a solitary finding. Otherwise MRI negative.   Family history is negative for breast or ovarian cancer   Past history reveals borderline non-insulin-dependent diabetes mellitus on metformin, hypertension, hyperlipidemia, chronic palpitations and PVCs, hypothyroidism, and she's had a bilateral tubal ligation. Her performance status is good. She does go to the gym regularly. She denies tobacco or alcohol. She has 3 children.        Past Medical History   Diagnosis  Date   .  Unspecified essential hypertension     .  Other and unspecified  hyperlipidemia     .  Mitral valve insufficiency and aortic valve insufficiency     .  Hypothyroidism     .  Cataract     .  Basal cell carcinoma           Past Surgical History   Procedure  Laterality  Date   .  Tonsillectomy       .  Tubal ligation       .  Appendectomy             Family History   Problem  Relation  Age of Onset   .  Lung cancer  Paternal Aunt          Social History History   Substance Use Topics   .  Smoking status:  Never Smoker    .  Smokeless tobacco:  Never Used   .  Alcohol Use:  Yes         Comment: occas         Allergies   Allergen  Reactions   .  Sulfa Antibiotics           Current Outpatient Prescriptions   Medication  Sig  Dispense  Refill   .  aspirin 81 MG tablet  Take 81 mg by mouth daily.           Marland Kitchen  atorvastatin (LIPITOR) 80 MG tablet  Take 1 tablet by mouth Daily.         .  Calcium Carbonate-Vitamin D (CALTRATE 600+D) 600-400 MG-UNIT per tablet  Take 1 tablet by mouth 2 (two) times daily.          .  cholecalciferol (VITAMIN D) 1000 UNITS tablet  Take 1,000 Units by mouth daily.           .  Coenzyme Q10 (CO Q 10) 100 MG CAPS  Take 200 mg by mouth daily.         Marland Kitchen  glucosamine-chondroitin 500-400 MG tablet  Take 1 tablet by mouth daily.           Marland Kitchen  levothyroxine (SYNTHROID, LEVOTHROID) 88 MCG tablet  Take 88 mcg by mouth daily.           Marland Kitchen  losartan-hydrochlorothiazide (HYZAAR) 50-12.5 MG per tablet  Take 1 tablet by mouth 2 (two) times daily.           Marland Kitchen  MAGNESIUM OXIDE PO  Take 1 tablet by mouth daily.           .  metFORMIN (GLUCOPHAGE-XR) 500 MG 24 hr tablet  Take 1 tablet by mouth Twice daily.         .  metoprolol tartrate (LOPRESSOR) 25 MG tablet  TAKE 1 TABLET BY MOUTH TWICE DAILY   180 tablet   2   .  metoprolol tartrate (LOPRESSOR) 25 MG tablet  Take 25 mg by mouth 2 (two) times daily. 1/2 pill in morning and 1/2 pill at night.         .  Multiple Vitamin (MULTIVITAMIN) tablet  Take 1 tablet by mouth daily.            .  Probiotic Product (ALIGN PO)  Take 1 capsule by mouth daily.           Marland Kitchen  UNABLE TO FIND  Take by mouth.                 Review of Systems  Constitutional: Negative for fever, chills and unexpected weight change.  HENT: Negative for congestion, hearing loss, sore throat, trouble swallowing and voice change.   Eyes: Negative for visual disturbance.  Respiratory: Negative for cough and wheezing.   Cardiovascular: Positive for palpitations. Negative for chest pain and leg swelling.  Gastrointestinal: Negative for nausea, vomiting, abdominal pain, diarrhea, constipation, blood in stool, abdominal distention and anal bleeding.  Genitourinary: Negative for hematuria, vaginal bleeding and difficulty urinating.  Musculoskeletal: Negative for arthralgias.  Skin: Negative for rash and wound.  Neurological: Negative for seizures, syncope and headaches.  Hematological: Negative for adenopathy. Does not bruise/bleed easily.  Psychiatric/Behavioral: Negative for confusion.         Physical Exam  Constitutional: She is oriented to person, place, and time. She appears well-developed and well-nourished. No distress.  HENT:   Head: Normocephalic and atraumatic.   Nose: Nose normal.   Mouth/Throat: No oropharyngeal exudate.  Eyes: Conjunctivae and EOM are normal. Pupils are equal, round, and reactive to light. Left eye exhibits no discharge. No scleral icterus.  Neck: Neck supple. No JVD present. No tracheal deviation present. No thyromegaly present.  Cardiovascular: Normal rate, regular rhythm, normal heart sounds and intact distal pulses.    No murmur heard. Pulmonary/Chest: Effort normal and breath sounds normal. No respiratory distress. She has no wheezes. She has no rales. She exhibits no tenderness.  There is a small  bruise in the right breast, lower outer quadrant. No palpable mass in either breast. Nipple and areolar complexes looked normal without drainage. No axillary  adenopathy.  Abdominal: Soft. Bowel sounds are normal. She exhibits no distension and no mass. There is no tenderness. There is no rebound and no guarding.  Musculoskeletal: She exhibits no edema and no tenderness.  Lymphadenopathy:    She has no cervical adenopathy.  Neurological: She is alert and oriented to person, place, and time. She exhibits normal muscle tone. Coordination normal.  Skin: Skin is warm. No rash noted. She is not diaphoretic. No erythema. No pallor.  Psychiatric: She has a normal mood and affect. Her behavior is normal. Judgment and thought content normal.     Data Reviewed I have reviewed her imaging studies, histology and breast diagnostic profile. I discussed her case at breast conference this morning. I have coordinated her treatment plan without calcium content and Dr. Gery Pray.    Assessment    High-grade DCIS with necrosis, right breast, lower outer quadrant, 2.6 cm area, a solitary finding.   Hypertension   Non-insulin-dependent diabetes mellitus   Hyperlipidemia   PVCs   Hypothyroidism      Plan    We had a long discussion about options. We discussed the options of partial mastectomy and total mastectomy. We talked about radiation therapy. We told her that we did not think sentinel lymph node biopsy was indicated in her case. She is very much motivated for breast conservation, and I think she is a good candidate for that.   She'll be scheduled for right partial mastectomy with radioactive seed localization in the future.   I discussed the indications, details, techniques, and numerous risks of the surgery with her. She's aware of the risk of bleeding, infection, reoperation for positive margins, cosmetic deformity, skin necrosis, nerve damage with chronic pain or numbness, cardiac, pulmonary and thromboembolic problems. She understands these issues well. At this time for questions are answered. She agrees with this plan.           Edsel Petrin. Dalbert Batman, M.D., [facility] General and Minimally invasive Surgery Breast and Colorectal Surgery Office:   [phone] Pager:   [phone]

## 2013-05-09 ENCOUNTER — Encounter (HOSPITAL_BASED_OUTPATIENT_CLINIC_OR_DEPARTMENT_OTHER)
Admission: RE | Disposition: A | Discharge status: Home / Self Care | Payer: Self-pay | Source: Ambulatory Visit | Attending: General Surgery

## 2013-05-09 ENCOUNTER — Encounter (HOSPITAL_BASED_OUTPATIENT_CLINIC_OR_DEPARTMENT_OTHER): Payer: Medicare Other | Admitting: Anesthesiology

## 2013-05-09 ENCOUNTER — Ambulatory Visit (HOSPITAL_BASED_OUTPATIENT_CLINIC_OR_DEPARTMENT_OTHER)
Admission: RE | Admit: 2013-05-09 | Discharge: 2013-05-09 | Disposition: A | Discharge status: Home / Self Care | Payer: Medicare Other | Source: Ambulatory Visit | Attending: General Surgery | Admitting: General Surgery

## 2013-05-09 ENCOUNTER — Ambulatory Visit (HOSPITAL_BASED_OUTPATIENT_CLINIC_OR_DEPARTMENT_OTHER): Payer: Medicare Other | Admitting: Anesthesiology

## 2013-05-09 ENCOUNTER — Encounter (HOSPITAL_BASED_OUTPATIENT_CLINIC_OR_DEPARTMENT_OTHER): Payer: Self-pay

## 2013-05-09 ENCOUNTER — Ambulatory Visit
Admission: RE | Admit: 2013-05-09 | Discharge: 2013-05-09 | Disposition: A | Discharge status: Home / Self Care | Payer: Medicare Other | Source: Ambulatory Visit | Attending: General Surgery | Admitting: General Surgery

## 2013-05-09 DIAGNOSIS — Z882 Allergy status to sulfonamides status: Secondary | ICD-10-CM | POA: Insufficient documentation

## 2013-05-09 DIAGNOSIS — R002 Palpitations: Secondary | ICD-10-CM | POA: Insufficient documentation

## 2013-05-09 DIAGNOSIS — H269 Unspecified cataract: Secondary | ICD-10-CM | POA: Insufficient documentation

## 2013-05-09 DIAGNOSIS — C50511 Malignant neoplasm of lower-outer quadrant of right female breast: Secondary | ICD-10-CM | POA: Diagnosis present

## 2013-05-09 DIAGNOSIS — Z7982 Long term (current) use of aspirin: Secondary | ICD-10-CM | POA: Insufficient documentation

## 2013-05-09 DIAGNOSIS — D0590 Unspecified type of carcinoma in situ of unspecified breast: Secondary | ICD-10-CM

## 2013-05-09 DIAGNOSIS — I10 Essential (primary) hypertension: Secondary | ICD-10-CM | POA: Insufficient documentation

## 2013-05-09 DIAGNOSIS — E785 Hyperlipidemia, unspecified: Secondary | ICD-10-CM | POA: Insufficient documentation

## 2013-05-09 DIAGNOSIS — E039 Hypothyroidism, unspecified: Secondary | ICD-10-CM | POA: Insufficient documentation

## 2013-05-09 DIAGNOSIS — E119 Type 2 diabetes mellitus without complications: Secondary | ICD-10-CM | POA: Insufficient documentation

## 2013-05-09 DIAGNOSIS — Z170 Estrogen receptor positive status [ER+]: Secondary | ICD-10-CM | POA: Insufficient documentation

## 2013-05-09 DIAGNOSIS — Z79899 Other long term (current) drug therapy: Secondary | ICD-10-CM | POA: Insufficient documentation

## 2013-05-09 HISTORY — DX: Type 2 diabetes mellitus without complications: E11.9

## 2013-05-09 HISTORY — PX: BREAST LUMPECTOMY: SHX2

## 2013-05-09 HISTORY — PX: BREAST LUMPECTOMY WITH RADIOACTIVE SEED LOCALIZATION: SHX6424

## 2013-05-09 LAB — GLUCOSE, CAPILLARY
Glucose-Capillary: 95 mg/dL (ref 70–99)
Glucose-Capillary: 89 mg/dL (ref 70–99)

## 2013-05-09 SURGERY — BREAST LUMPECTOMY WITH RADIOACTIVE SEED LOCALIZATION
Anesthesia: General | Site: Breast | Laterality: Right

## 2013-05-09 MED ORDER — CEFAZOLIN SODIUM-DEXTROSE 2-3 GM-% IV SOLR
2.0000 g | INTRAVENOUS | Status: AC
  Administered 2013-05-09: 2 g via INTRAVENOUS

## 2013-05-09 MED ORDER — CHLORHEXIDINE GLUCONATE 4 % EX LIQD: 1.0000 "application " | Freq: Once | CUTANEOUS | Status: DC

## 2013-05-09 MED ORDER — HYDROCODONE-ACETAMINOPHEN 5-325 MG PO TABS: 1.0000 | ORAL_TABLET | Freq: Four times a day (QID) | ORAL | Status: DC | PRN

## 2013-05-09 MED ORDER — MIDAZOLAM HCL 2 MG/2ML IJ SOLN: 1.0000 mg | INTRAMUSCULAR | Status: DC | PRN

## 2013-05-09 MED ORDER — OXYCODONE HCL 5 MG PO TABS: 5.0000 mg | ORAL_TABLET | Freq: Once | ORAL | Status: DC | PRN

## 2013-05-09 MED ORDER — LACTATED RINGERS IV SOLN
INTRAVENOUS | Status: DC
  Administered 2013-05-09: 14:00:00 via INTRAVENOUS

## 2013-05-09 MED ORDER — FENTANYL CITRATE 0.05 MG/ML IJ SOLN
INTRAMUSCULAR | Status: DC | PRN
  Administered 2013-05-09: 50 ug via INTRAVENOUS
  Administered 2013-05-09 (×2): 25 ug via INTRAVENOUS

## 2013-05-09 MED ORDER — DEXAMETHASONE SODIUM PHOSPHATE 4 MG/ML IJ SOLN
INTRAMUSCULAR | Status: DC | PRN
  Administered 2013-05-09: 4 mg via INTRAVENOUS

## 2013-05-09 MED ORDER — ONDANSETRON HCL 4 MG/2ML IJ SOLN: 4.0000 mg | Freq: Once | INTRAMUSCULAR | Status: DC | PRN

## 2013-05-09 MED ORDER — BUPIVACAINE-EPINEPHRINE (PF) 0.5% -1:200000 IJ SOLN
INTRAMUSCULAR | Status: DC | PRN
  Administered 2013-05-09: 4 mL

## 2013-05-09 MED ORDER — OXYCODONE HCL 5 MG/5ML PO SOLN: 5.0000 mg | Freq: Once | ORAL | Status: DC | PRN

## 2013-05-09 MED ORDER — ONDANSETRON HCL 4 MG/2ML IJ SOLN
INTRAMUSCULAR | Status: DC | PRN
  Administered 2013-05-09: 4 mg via INTRAVENOUS

## 2013-05-09 MED ORDER — LIDOCAINE HCL (CARDIAC) 20 MG/ML IV SOLN
INTRAVENOUS | Status: DC | PRN
  Administered 2013-05-09: 60 mg via INTRAVENOUS

## 2013-05-09 MED ORDER — BUPIVACAINE-EPINEPHRINE (PF) 0.5% -1:200000 IJ SOLN
INTRAMUSCULAR | Status: AC
  Filled 2013-05-09: qty 30

## 2013-05-09 MED ORDER — HYDROMORPHONE HCL PF 1 MG/ML IJ SOLN
INTRAMUSCULAR | Status: AC
  Filled 2013-05-09: qty 1

## 2013-05-09 MED ORDER — FENTANYL CITRATE 0.05 MG/ML IJ SOLN
INTRAMUSCULAR | Status: AC
  Filled 2013-05-09: qty 6

## 2013-05-09 MED ORDER — PROPOFOL 10 MG/ML IV BOLUS
INTRAVENOUS | Status: DC | PRN
  Administered 2013-05-09: 150 mg via INTRAVENOUS

## 2013-05-09 MED ORDER — CEFAZOLIN SODIUM-DEXTROSE 2-3 GM-% IV SOLR
INTRAVENOUS | Status: AC
  Filled 2013-05-09: qty 50

## 2013-05-09 MED ORDER — HYDROMORPHONE HCL PF 1 MG/ML IJ SOLN
0.2500 mg | INTRAMUSCULAR | Status: DC | PRN
  Administered 2013-05-09 (×2): 0.5 mg via INTRAVENOUS

## 2013-05-09 MED ORDER — FENTANYL CITRATE 0.05 MG/ML IJ SOLN: 50.0000 ug | INTRAMUSCULAR | Status: DC | PRN

## 2013-05-09 SURGICAL SUPPLY — 65 items
APL SKNCLS STERI-STRIP NONHPOA (GAUZE/BANDAGES/DRESSINGS)
APPLIER CLIP 9.375 MED OPEN (MISCELLANEOUS) ×3
APR CLP MED 9.3 20 MLT OPN (MISCELLANEOUS) ×1
BENZOIN TINCTURE PRP APPL 2/3 (GAUZE/BANDAGES/DRESSINGS) IMPLANT
BINDER BREAST LRG (GAUZE/BANDAGES/DRESSINGS) ×3 IMPLANT
BINDER BREAST MEDIUM (GAUZE/BANDAGES/DRESSINGS) IMPLANT
BINDER BREAST XLRG (GAUZE/BANDAGES/DRESSINGS) IMPLANT
BINDER BREAST XXLRG (GAUZE/BANDAGES/DRESSINGS) IMPLANT
BLADE 10 SAFETY STRL DISP (BLADE) IMPLANT
BLADE 15 SAFETY STRL DISP (BLADE) ×3 IMPLANT
BLADE HEX COATED 2.75 (ELECTRODE) ×3 IMPLANT
CANISTER SUC SOCK COL 7IN (MISCELLANEOUS) IMPLANT
CANISTER SUCT 1200ML W/VALVE (MISCELLANEOUS) ×3 IMPLANT
CHLORAPREP W/TINT 26ML (MISCELLANEOUS) ×3 IMPLANT
CLIP APPLIE 9.375 MED OPEN (MISCELLANEOUS) ×1 IMPLANT
CLOSURE WOUND 1/2 X4 (GAUZE/BANDAGES/DRESSINGS)
COVER MAYO STAND STRL (DRAPES) ×3 IMPLANT
COVER PROBE W GEL 5X96 (DRAPES) ×3 IMPLANT
COVER TABLE BACK 60X90 (DRAPES) ×3 IMPLANT
DECANTER SPIKE VIAL GLASS SM (MISCELLANEOUS) IMPLANT
DERMABOND ADVANCED (GAUZE/BANDAGES/DRESSINGS) ×2
DERMABOND ADVANCED .7 DNX12 (GAUZE/BANDAGES/DRESSINGS) ×1 IMPLANT
DEVICE DUBIN W/COMP PLATE 8390 (MISCELLANEOUS) ×3 IMPLANT
DRAIN CHANNEL 19F RND (DRAIN) IMPLANT
DRAPE LAPAROSCOPIC ABDOMINAL (DRAPES) ×3 IMPLANT
DRAPE UTILITY XL STRL (DRAPES) ×3 IMPLANT
DRSG PAD ABDOMINAL 8X10 ST (GAUZE/BANDAGES/DRESSINGS) IMPLANT
ELECT REM PT RETURN 9FT ADLT (ELECTROSURGICAL) ×3
ELECTRODE REM PT RTRN 9FT ADLT (ELECTROSURGICAL) ×1 IMPLANT
EVACUATOR SILICONE 100CC (DRAIN) IMPLANT
GLOVE BIO SURGEON STRL SZ7 (GLOVE) ×3 IMPLANT
GLOVE BIOGEL PI IND STRL 7.5 (GLOVE) ×1 IMPLANT
GLOVE BIOGEL PI INDICATOR 7.5 (GLOVE) ×2
GLOVE EUDERMIC 7 POWDERFREE (GLOVE) ×3 IMPLANT
GLOVE EXAM NITRILE EXT CFF LRG (GLOVE) ×6 IMPLANT
GOWN STRL REUS W/ TWL LRG LVL3 (GOWN DISPOSABLE) ×1 IMPLANT
GOWN STRL REUS W/ TWL XL LVL3 (GOWN DISPOSABLE) ×1 IMPLANT
GOWN STRL REUS W/TWL LRG LVL3 (GOWN DISPOSABLE) ×2
GOWN STRL REUS W/TWL XL LVL3 (GOWN DISPOSABLE) ×3
KIT MARKER MARGIN INK (KITS) ×3 IMPLANT
NEEDLE HYPO 25X1 1.5 SAFETY (NEEDLE) ×3 IMPLANT
NS IRRIG 1000ML POUR BTL (IV SOLUTION) ×3 IMPLANT
PACK BASIN DAY SURGERY FS (CUSTOM PROCEDURE TRAY) ×3 IMPLANT
PENCIL BUTTON HOLSTER BLD 10FT (ELECTRODE) ×3 IMPLANT
PIN SAFETY STERILE (MISCELLANEOUS) ×3 IMPLANT
SHEET MEDIUM DRAPE 40X70 STRL (DRAPES) ×3 IMPLANT
SLEEVE SCD COMPRESS KNEE MED (MISCELLANEOUS) ×3 IMPLANT
SPONGE GAUZE 4X4 12PLY STER LF (GAUZE/BANDAGES/DRESSINGS) IMPLANT
SPONGE LAP 18X18 X RAY DECT (DISPOSABLE) IMPLANT
SPONGE LAP 4X18 X RAY DECT (DISPOSABLE) ×3 IMPLANT
STRIP CLOSURE SKIN 1/2X4 (GAUZE/BANDAGES/DRESSINGS) IMPLANT
SUT ETHILON 3 0 FSL (SUTURE) IMPLANT
SUT MNCRL AB 4-0 PS2 18 (SUTURE) ×3 IMPLANT
SUT SILK 2 0 SH (SUTURE) ×3 IMPLANT
SUT VIC AB 2-0 CT1 27 (SUTURE)
SUT VIC AB 2-0 CT1 TAPERPNT 27 (SUTURE) IMPLANT
SUT VIC AB 3-0 SH 27 (SUTURE)
SUT VIC AB 3-0 SH 27X BRD (SUTURE) IMPLANT
SUT VICRYL 3-0 CR8 SH (SUTURE) ×3 IMPLANT
SYR CONTROL 10ML LL (SYRINGE) ×3 IMPLANT
TOWEL OR 17X24 6PK STRL BLUE (TOWEL DISPOSABLE) ×3 IMPLANT
TOWEL OR NON WOVEN STRL DISP B (DISPOSABLE) ×3 IMPLANT
TUBE CONNECTING 20'X1/4 (TUBING) ×1
TUBE CONNECTING 20X1/4 (TUBING) ×2 IMPLANT
YANKAUER SUCT BULB TIP NO VENT (SUCTIONS) ×3 IMPLANT

## 2013-05-09 NOTE — Anesthesia Preprocedure Evaluation (Signed)
Anesthesia Evaluation  Patient identified by MRN, date of birth, ID band Patient awake    Reviewed: Allergy & Precautions, H&P , NPO status , Patient's Chart, lab work & pertinent test results  Airway Mallampati: I TM Distance: >3 FB Neck ROM: Full    Dental  (+) Teeth Intact, Dental Advisory Given   Pulmonary  breath sounds clear to auscultation        Cardiovascular hypertension, Pt. on medications Rhythm:Regular Rate:Normal     Neuro/Psych    GI/Hepatic   Endo/Other  diabetes, Well Controlled, Type 2, Oral Hypoglycemic Agents  Renal/GU      Musculoskeletal   Abdominal   Peds  Hematology   Anesthesia Other Findings   Reproductive/Obstetrics                           Anesthesia Physical Anesthesia Plan  ASA: III  Anesthesia Plan: General   Post-op Pain Management:    Induction: Intravenous  Airway Management Planned: LMA  Additional Equipment:   Intra-op Plan:   Post-operative Plan: Extubation in OR  Informed Consent: I have reviewed the patients History and Physical, chart, labs and discussed the procedure including the risks, benefits and alternatives for the proposed anesthesia with the patient or authorized representative who has indicated his/her understanding and acceptance.   Dental advisory given  Plan Discussed with: CRNA, Anesthesiologist and Surgeon  Anesthesia Plan Comments:         Anesthesia Quick Evaluation

## 2013-05-09 NOTE — Anesthesia Procedure Notes (Signed)
Procedure Name: LMA Insertion Date/Time: 05/09/2013 3:12 PM Performed by: Sissy Goetzke Pre-anesthesia Checklist: Patient identified, Emergency Drugs available, Suction available and Patient being monitored Patient Re-evaluated:Patient Re-evaluated prior to inductionOxygen Delivery Method: Circle System Utilized Preoxygenation: Pre-oxygenation with 100% oxygen Intubation Type: IV induction Ventilation: Mask ventilation without difficulty LMA: LMA inserted LMA Size: 4.0 Number of attempts: 1 Airway Equipment and Method: bite block Placement Confirmation: positive ETCO2 Tube secured with: Tape Dental Injury: Teeth and Oropharynx as per pre-operative assessment

## 2013-05-09 NOTE — Anesthesia Postprocedure Evaluation (Signed)
  Anesthesia Post-op Note  Patient: Cristina Douglas  Procedure(s) Performed: Procedure(s): BREAST LUMPECTOMY WITH RADIOACTIVE SEED LOCALIZATION (Right)  Patient Location: PACU  Anesthesia Type:General  Level of Consciousness: awake, alert  and oriented  Airway and Oxygen Therapy: Patient Spontanous Breathing  Post-op Pain: mild  Post-op Assessment: Post-op Vital signs reviewed  Post-op Vital Signs: Reviewed  Last Vitals:  Filed Vitals:   05/09/13 1716  BP: 151/77  Pulse:   Temp: 36.7 C  Resp: 20    Complications: No apparent anesthesia complications

## 2013-05-09 NOTE — Transfer of Care (Signed)
Immediate Anesthesia Transfer of Care Note  Patient: Cristina Douglas  Procedure(s) Performed: Procedure(s): BREAST LUMPECTOMY WITH RADIOACTIVE SEED LOCALIZATION (Right)  Patient Location: PACU  Anesthesia Type:General  Level of Consciousness: awake, alert , oriented and patient cooperative  Airway & Oxygen Therapy: Patient Spontanous Breathing and Patient connected to face mask oxygen  Post-op Assessment: Report given to PACU RN and Post -op Vital signs reviewed and stable  Post vital signs: Reviewed and stable  Complications: No apparent anesthesia complications

## 2013-05-09 NOTE — Interval H&P Note (Signed)
History and Physical Interval Note:  05/09/2013 2:54 PM  [place]  has presented today for surgery, with the diagnosis of DCIS.     The various methods of treatment have been discussed with the patient and family. After consideration of risks, benefits and other options for treatment, the patient has consented to  Procedure(s): BREAST LUMPECTOMY WITH RADIOACTIVE SEED LOCALIZATION (Right) as a surgical intervention .  The patient's history has been reviewed, patient examined today, no change in status, stable for surgery.  I have reviewed the patient's chart and labs.  Questions were answered to the patient's satisfaction.     Adin Hector

## 2013-05-09 NOTE — Op Note (Signed)
Patient Name:           Cristina Douglas   Date of Surgery:        05/09/2013  Note: This dictation was prepared with Dragon/digital dictation along with [employer]. Any transcriptional errors that result from this process are unintentional.   Pre op Diagnosis:      Ductal carcinoma in situ right breast, lower outer quadrant  Post op Diagnosis:    Carcinoma in situ, right breast, lower central  Procedure:                 Right partial mastectomy with radioactive seed localization  Surgeon:                     Edsel Petrin. Dalbert Batman, M.D., [place]  Assistant:                      None  Operative Indications:   Cristina Douglas is a 78 y.o. female. She is referred by Dr. Miquel Dunn at the breast center [facility] for evaluation and management of high grade DCIS right breast, lower outer quadrant. Receptor positive.Her PCP is Dr. Rory Percy. Her endocrinologist is Dr. Jacelyn Pi. She was evaluated in the [facility]  by Dr. Marcy Panning, Dr. Gery Pray, and me.      She has no past history of breast problems. She gets yearly mammograms. Recent mammograms showed a 2.6 cm linear area of microcalcifications in the right breast, lower outer quadrant. Image guided biopsy shows high-grade DCIS with necrosis, ER +100%, PR-positive 34%. MRI shows a 2.5 cm area of enhancement in the lower outer quadrant of the right breast. This is a solitary finding. Otherwise MRI negative.  Family history is negative for breast or ovarian cancer    Operative Findings:       There was a single radioactive seed seen on the mammogram. There is a single radioactive seed in the specimen. All the radioactivity was contained within the lumpectomy specimen and there was no radioactivity left in the breast after  the case. A marker clip and the seed were in the exact center of the specimen, hopefully predicting good margins.  Procedure in Detail:          The radioactive seed was placed a few days ago. The patient was brought  to the holding area and the right breast was examined with the neoprobe identifying radioactivity in the lower right breast.The patient was taken to the operating room where general anesthesia with LMA device was induced. The right breast was prepped and draped in a sterile fashion. Intravenous antibiotics were given. Surgical time out was performed. 0.5% Marcaine with epinephrine was used as local infiltration anesthetic. Using the neoprobe I identified the area of maximum radioactivity at the 6:00 position about 2 cm below the lower areolar margin. I therefore made a radially oriented, vertically oriented incision at the 6 clock position. Dissection was carried down into the breast tissue. I dissected widely around the radioactivity using the neoprobe as a guide frequently. The specimen was removed and marked with silk sutures and the 6 color ink kit to orient the pathologist. Specimen mammogram looked very good with the marker clip and seed in the center of the specimen and about a 2 cm margin in all directions. The specimen was marked and sent to the pathology lab. Hemostasis was excellent and achieved with electrocautery. The wound was irrigated with saline. The breast tissues were closed in  layers with interrupted sutures of 3-0 Vicryl and the skin closed with a running subcuticular suture of 4-0 Monocryl and Dermabond. A breast binder was placed and the patient taken to PACU in stable condition. EBL 10 cc. Counts correct. Complications none.     Edsel Petrin. Dalbert Batman, M.D., FACS General and Minimally Invasive Surgery Breast and Colorectal Surgery  05/09/2013 4:16 PM

## 2013-05-09 NOTE — Discharge Instructions (Signed)
[facility] °Office Phone Number [phone] ° °BREAST BIOPSY/ PARTIAL MASTECTOMY: POST OP INSTRUCTIONS ° °Always review your discharge instruction sheet given to you by the facility where your surgery was performed. ° °IF YOU HAVE DISABILITY OR FAMILY LEAVE FORMS, YOU MUST BRING THEM TO THE OFFICE FOR PROCESSING.  DO NOT GIVE THEM TO YOUR DOCTOR. ° °1. A prescription for pain medication may be given to you upon discharge.  Take your pain medication as prescribed, if needed.  If narcotic pain medicine is not needed, then you may take acetaminophen (Tylenol) or ibuprofen (Advil) as needed. °2. Take your usually prescribed medications unless otherwise directed °3. If you need a refill on your pain medication, please contact your pharmacy.  They will contact our office to request authorization.  Prescriptions will not be filled after 5pm or on week-ends. °4. You should eat very light the first 24 hours after surgery, such as soup, crackers, pudding, etc.  Resume your normal diet the day after surgery. °5. Most patients will experience some swelling and bruising in the breast.  Ice packs and a good support bra will help.  Swelling and bruising can take several days to resolve.  °6. It is common to experience some constipation if taking pain medication after surgery.  Increasing fluid intake and taking a stool softener will usually help or prevent this problem from occurring.  A mild laxative (Milk of Magnesia or Miralax) should be taken according to package directions if there are no bowel movements after 48 hours. °7. Unless discharge instructions indicate otherwise, you may remove your bandages 24-48 hours after surgery, and you may shower at that time.  You may have steri-strips (small skin tapes) in place directly over the incision.  These strips should be left on the skin for 7-10 days.  If your surgeon used skin glue on the incision, you may shower in 24 hours.  The glue will flake off over the  next 2-3 weeks.  Any sutures or staples will be removed at the office during your follow-up visit. °8. ACTIVITIES:  You may resume regular daily activities (gradually increasing) beginning the next day.  Wearing a good support bra or sports bra minimizes pain and swelling.  You may have sexual intercourse when it is comfortable. °a. You may drive when you no longer are taking prescription pain medication, you can comfortably wear a seatbelt, and you can safely maneuver your car and apply brakes. °b. RETURN TO WORK:  ______________________________________________________________________________________ °9. You should see your doctor in the office for a follow-up appointment approximately two weeks after your surgery.  Your doctor’s nurse will typically make your follow-up appointment when she calls you with your pathology report.  Expect your pathology report 2-3 business days after your surgery.  You may call to check if you do not hear from us after three days. °10. OTHER INSTRUCTIONS: _______________________________________________________________________________________________ _____________________________________________________________________________________________________________________________________ °_____________________________________________________________________________________________________________________________________ °_____________________________________________________________________________________________________________________________________ ° °WHEN TO CALL YOUR DOCTOR: °1. Fever over 101.0 °2. Nausea and/or vomiting. °3. Extreme swelling or bruising. °4. Continued bleeding from incision. °5. Increased pain, redness, or drainage from the incision. ° °The clinic staff is available to answer your questions during regular business hours.  Please don’t hesitate to call and ask to speak to one of the nurses for clinical concerns.  If you have a medical emergency, go to the nearest  emergency room or call 911.  A surgeon from [facility] is always on call at the hospital. ° °For further questions, please visit [email or web address]  ° ° °  Post Anesthesia Home Care Instructions ° °Activity: °Get plenty of rest for the remainder of the day. A responsible adult should stay with you for 24 hours following the procedure.  °For the next 24 hours, DO NOT: °-Drive a car °-Operate machinery °-Drink alcoholic beverages °-Take any medication unless instructed by your physician °-Make any legal decisions or sign important papers. ° °Meals: °Start with liquid foods such as gelatin or soup. Progress to regular foods as tolerated. Avoid greasy, spicy, heavy foods. If nausea and/or vomiting occur, drink only clear liquids until the nausea and/or vomiting subsides. Call your physician if vomiting continues. ° °Special Instructions/Symptoms: °Your throat may feel dry or sore from the anesthesia or the breathing tube placed in your throat during surgery. If this causes discomfort, gargle with warm salt water. The discomfort should disappear within 24 hours. ° °

## 2013-05-10 ENCOUNTER — Encounter (HOSPITAL_BASED_OUTPATIENT_CLINIC_OR_DEPARTMENT_OTHER): Payer: Self-pay | Admitting: General Surgery

## 2013-05-11 ENCOUNTER — Telehealth (INDEPENDENT_AMBULATORY_CARE_PROVIDER_SITE_OTHER): Payer: Self-pay

## 2013-05-11 NOTE — Telephone Encounter (Signed)
Pt notified of path result per Dr Darrel Hoover request. Pt has appt with Dr Sondra Come.

## 2013-05-11 NOTE — Telephone Encounter (Signed)
Message copied by Dois Davenport on Fri May 11, 2013  4:59 PM ------      Message from: Cristina Douglas      Created: Fri May 11, 2013  4:42 PM       Inform patient of Pathology report,.Tell her that the ductal carcinoma in situ was completely excised and that there is no evidence of invasive cancer. One of the margins as close but is negative. I don't think we'll need to do any further surgery. She needs to be referred back to Dr. Gery Pray as soon as possible for his opinion.            Also, please put her on the next available breast cancer conference.                  hmi ------

## 2013-05-11 NOTE — Progress Notes (Signed)
Quick Note:  Inform patient of Pathology report,.Tell her that the ductal carcinoma in situ was completely excised and that there is no evidence of invasive cancer. One of the margins as close but is negative. I don't think we'll need to do any further surgery. She needs to be referred back to Dr. Gery Pray as soon as possible for his opinion.  Also, please put her on the next available breast cancer conference.   hmi ______

## 2013-05-16 ENCOUNTER — Other Ambulatory Visit: Payer: Self-pay | Admitting: Dermatology

## 2013-05-23 ENCOUNTER — Ambulatory Visit: Payer: 59

## 2013-05-23 ENCOUNTER — Ambulatory Visit: Payer: 59 | Admitting: Radiation Oncology

## 2013-05-25 ENCOUNTER — Ambulatory Visit (INDEPENDENT_AMBULATORY_CARE_PROVIDER_SITE_OTHER): Payer: Medicare Other | Admitting: General Surgery

## 2013-05-25 ENCOUNTER — Encounter (INDEPENDENT_AMBULATORY_CARE_PROVIDER_SITE_OTHER): Payer: Self-pay | Admitting: General Surgery

## 2013-05-25 VITALS — BP 112/70 | HR 80 | Temp 97.8°F | Resp 16 | Ht 63.0 in | Wt 170.4 lb

## 2013-05-25 DIAGNOSIS — C50519 Malignant neoplasm of lower-outer quadrant of unspecified female breast: Secondary | ICD-10-CM

## 2013-05-25 DIAGNOSIS — C50511 Malignant neoplasm of lower-outer quadrant of right female breast: Secondary | ICD-10-CM

## 2013-05-25 NOTE — Patient Instructions (Addendum)
You are recovering from your right partial mastectomy without any obvious complications.  We have reviewed the pathology report. As we discussed that shows high-grade ductal carcinoma in situ, but there is no evidence of invasive cancer. There is a close but negative margin. The consensus view at our tumor board is that you do not need any further surgery.  Proceed with radiation therapy  Return to see Dr. Dalbert Batman in 3 months, sooner if there are any problems.

## 2013-05-25 NOTE — Progress Notes (Signed)
Patient ID: Cristina Douglas, female   DOB: August 27, 1935, 78 y.o.   MRN: [id number] History: This patient underwent right partial mastectomy with radioactive seed localization on 05/09/2013. She has ductal carcinoma in situ, high-grade, with comedo necrosis, receptor positive. Pathology report shows a 1 mm margin laterally, which was not anticipated because the specimen mammogram showed the marker clip and see the exact center of the specimen. This was discussed at tumor Board and it was the consensus view that she does not need any further surgery. She has seen Dr. Sondra Come in [place] and he plans to begin radiation therapy on June 23. Next she feels well. Not much pain. Basically happy with her results    Exam: Patient looks well. Good spirits. Husband is with her. Right breast shows good contour, nipple projection, and cosmetic result. Incision is healing normally. Minimal ecchymoses.  Assessment: High-grade DCIS with comedo necrosis, right breast, 6 clock position, receptor positive. Recovering uneventfully following right partial mastectomy with radioactive seed localization. Close but negative lateral margin. No further excision indicated  Plan: wound care discussed Procedure with radiation therapy June 23 Return to see me in 3 months. Her medical oncology care has been transferred to Dr. Jana Hakim.    Edsel Petrin. Dalbert Batman, M.D., [facility] General and Minimally invasive Surgery Breast and Colorectal Surgery Office:   [phone] Pager:   [phone]

## 2013-06-11 ENCOUNTER — Telehealth: Payer: Self-pay | Admitting: *Deleted

## 2013-06-11 NOTE — Telephone Encounter (Signed)
Called pt to r/s f/u with Dr. Jana Hakim.  R/S appt to 08/23/13 at 10:00am. Pt will have completed xrt by this time.  Pt denies further needs at this time.

## 2013-06-14 ENCOUNTER — Ambulatory Visit: Payer: 59 | Admitting: Adult Health

## 2013-06-25 ENCOUNTER — Other Ambulatory Visit: Payer: Self-pay | Admitting: Dermatology

## 2013-07-09 ENCOUNTER — Ambulatory Visit: Payer: 59 | Admitting: Oncology

## 2013-07-27 ENCOUNTER — Telehealth: Payer: Self-pay | Admitting: Oncology

## 2013-07-27 NOTE — Telephone Encounter (Signed)
returned pt's call and lmonvm for pt on both cell and home phones confirming appt w/GM for 8/20 @ 10am.

## 2013-08-17 ENCOUNTER — Telehealth: Payer: Self-pay | Admitting: Hematology and Oncology

## 2013-08-17 NOTE — Telephone Encounter (Signed)
per Terri to move from GM to VG-cld pt to adv

## 2013-08-23 ENCOUNTER — Ambulatory Visit: Payer: Medicare Other | Admitting: Oncology

## 2013-08-23 ENCOUNTER — Ambulatory Visit (HOSPITAL_BASED_OUTPATIENT_CLINIC_OR_DEPARTMENT_OTHER): Payer: Medicare Other | Admitting: Hematology and Oncology

## 2013-08-23 ENCOUNTER — Encounter: Payer: Self-pay | Admitting: Hematology and Oncology

## 2013-08-23 VITALS — BP 120/94 | HR 71 | Temp 97.5°F | Resp 20 | Ht 63.0 in | Wt 171.1 lb

## 2013-08-23 DIAGNOSIS — D0590 Unspecified type of carcinoma in situ of unspecified breast: Secondary | ICD-10-CM

## 2013-08-23 DIAGNOSIS — C50511 Malignant neoplasm of lower-outer quadrant of right female breast: Secondary | ICD-10-CM

## 2013-08-23 DIAGNOSIS — Z170 Estrogen receptor positive status [ER+]: Secondary | ICD-10-CM

## 2013-08-23 MED ORDER — ANASTROZOLE 1 MG PO TABS: 1.0000 mg | ORAL_TABLET | Freq: Every day | ORAL | Status: DC

## 2013-08-23 NOTE — Progress Notes (Signed)
Office Note created by Dr. Lindi Adie during visit.  Copy to patient.  Original to scan.

## 2013-08-23 NOTE — Assessment & Plan Note (Signed)
1. DCIS high grade ER/PR positive status post lumpectomy on the right breast in May 2015. Patient completed radiation therapy and is here today to discuss adjuvant antiestrogen therapy.  2. I discussed with her the pathology report extensively and explained what DCIS means and the significance of estrogen and progesterone receptors. I recommended initiating anastrozole 1 mg once daily to decrease the risk of recurrence of DCIS and the risk of breast cancer as well.  3. We discussed the risks and benefits of anti-estrogen therapy with aromatase inhibitors. These include but not limited to insomnia, hot flashes, mood changes, vaginal dryness, bone density loss, and weight gain. Although rare, serious side effects including endometrial cancer, risk of blood clots were also discussed. We strongly believe that the benefits far outweigh the risks. Patient understands these risks and consented to starting treatment. Planned treatment duration is 5 years.  Return to clinic in one month for toxicity evaluation.

## 2013-08-23 NOTE — Progress Notes (Signed)
Patient Care Team: Rory Percy, MD as PCP - General (Cardiology)  DIAGNOSIS: Breast cancer of lower-outer quadrant of right female breast   Primary site: Breast (Right)   Staging method: AJCC 7th Edition   Clinical: Stage 0 (Tis (DCIS), N0, cM0)   Summary: Stage 0 (Tis (DCIS), N0, cM0)   Clinical comments: Staged at breast conference 04/18/13.   SUMMARY OF ONCOLOGIC HISTORY:   Breast cancer of lower-outer quadrant of right female breast   04/12/2013 Initial Diagnosis Right stereotactic core needle biopsy: Breast cancer of lower-outer quadrant of right female breast: High grade DCIS with comedo necrosis ER/PR positive   04/13/2013 Breast MRI Right breast 2.5 x 2.2 x 0.9 cm nodular enhancement   06/26/2013 -  Radiation Therapy Radiation to lumpectomy site by Dr. Sondra Come in [place] COMPLIANT:  Initial visit post surgery and radiation therapy for DCIS INTERVAL HISTORY:  Cristina Douglas is a 78 year old Caucasian lady with the above-mentioned history of DCIS. She was diagnosed with DCIS with a routine mammogram. This left a breast MRI in April 2015 that revealed a 2.5 x 2.2 x 0.9 cm nodular enhancement she then underwent lumpectomy in May 2015 that revealed high-grade DCIS ER/PR positive. Subsequently she underwent radiation therapy which was completed successfully and she is here today to discuss adjuvant antiestrogen therapy. Her breasts are mildly tender but has no new lumps or nodules. She denies any new concerns. She stays very active participating in [facility] doing aerobic exercises. Her only complaint is her shoulders hurt when she gets them above the head.  REVIEW OF SYSTEMS:   Constitutional: Denies fevers, chills or abnormal weight loss Eyes: Denies blurriness of vision Ears, nose, mouth, throat, and face: Denies mucositis or sore throat Respiratory: Denies cough, dyspnea or wheezes Cardiovascular: Denies palpitation, chest discomfort or lower extremity swelling Gastrointestinal:  Denies  nausea, heartburn or change in bowel habits Skin: Denies abnormal skin rashes Lymphatics: Denies new lymphadenopathy or easy bruising Neurological:Denies numbness, tingling or new weaknesses Behavioral/Psych: Mood is stable, no new changes  Breasts: Mild tenderness All other systems were reviewed with the patient and are negative.  I have reviewed the past medical history, past surgical history, social history and family history with the patient and they are unchanged from previous note.  ALLERGIES:  is allergic to sulfa antibiotics.  MEDICATIONS:  Current Outpatient Prescriptions  Medication Sig Dispense Refill  . aspirin 81 MG tablet Take 81 mg by mouth daily.        Marland Kitchen atorvastatin (LIPITOR) 80 MG tablet Take 1 tablet by mouth Daily.      . Calcium Carbonate-Vitamin D (CALTRATE 600+D) 600-400 MG-UNIT per tablet Take 1 tablet by mouth 2 (two) times daily.       . cholecalciferol (VITAMIN D) 1000 UNITS tablet Take 1,000 Units by mouth daily.        . Coenzyme Q10 (CO Q 10) 100 MG CAPS Take 200 mg by mouth daily.      Marland Kitchen glucosamine-chondroitin 500-400 MG tablet Take 1 tablet by mouth daily.        Marland Kitchen levothyroxine (SYNTHROID, LEVOTHROID) 88 MCG tablet Take 88 mcg by mouth daily.        Marland Kitchen losartan-hydrochlorothiazide (HYZAAR) 50-12.5 MG per tablet Take 1 tablet by mouth 2 (two) times daily.        Marland Kitchen MAGNESIUM OXIDE PO Take 1 tablet by mouth daily.        . metFORMIN (GLUCOPHAGE-XR) 500 MG 24 hr tablet Take 1 tablet  by mouth Twice daily.      . metoprolol tartrate (LOPRESSOR) 25 MG tablet Take 25 mg by mouth 2 (two) times daily. 1/2 pill in morning and 1/2 pill at night.      . Misc Natural Products (OSTEO BI-FLEX JOINT SHIELD PO) Take by mouth daily.      . Multiple Vitamin (MULTIVITAMIN) tablet Take 1 tablet by mouth daily.        . niacinamide 500 MG tablet Take 500 mg by mouth daily.      . Probiotic Product (PROBIOTIC DAILY PO) Take by mouth.      Marland Kitchen anastrozole (ARIMIDEX) 1 MG tablet  Take 1 tablet (1 mg total) by mouth daily.  90 tablet  3   No current facility-administered medications for this visit.    PHYSICAL EXAMINATION: ECOG PERFORMANCE STATUS: 1 - Symptomatic but completely ambulatory  Filed Vitals:   08/23/13 0839  BP: 120/94  Pulse: 71  Temp: 97.5 F (36.4 C)  Resp: 20   Filed Weights   08/23/13 0839  Weight: 171 lb 1.6 oz (77.61 kg)    GENERAL:alert, no distress and comfortable SKIN: skin color, texture, turgor are normal, no rashes or significant lesions EYES: normal, Conjunctiva are pink and non-injected, sclera clear OROPHARYNX:no exudate, no erythema and lips, buccal mucosa, and tongue normal  NECK: supple, thyroid normal size, non-tender, without nodularity LYMPH:  no palpable lymphadenopathy in the cervical, axillary or inguinal LUNGS: clear to auscultation and percussion with normal breathing effort HEART: regular rate & rhythm and no murmurs and no lower extremity edema ABDOMEN:abdomen soft, non-tender and normal bowel sounds Musculoskeletal:no cyanosis of digits and no clubbing  NEURO: alert & oriented x 3 with fluent speech, no focal motor/sensory deficits BREAST: No palpable masses lungs or nodules in either right or left breasts. No palpable axillary supraclavicular or infraclavicular adenopathy no breast tenderness or nipple discharge.   LABORATORY DATA:  I have reviewed the data as listed No visits with results within 1 Month(s) from this visit. Latest known visit with results is:  Admission on 05/09/2013, Discharged on 05/09/2013  Component Date Value Ref Range Status  . Glucose-Capillary 05/09/2013 95  70 - 99 mg/dL Final  . Glucose-Capillary 05/09/2013 89  70 - 99 mg/dL Final    RADIOGRAPHIC STUDIES: I have personally reviewed the radiology reports and agreed with their findings. No results found.   ASSESSMENT & PLAN:  Breast cancer of lower-outer quadrant of right female breast 1. DCIS high grade ER/PR positive  status post lumpectomy on the right breast in May 2015. Patient completed radiation therapy and is here today to discuss adjuvant antiestrogen therapy.  2. I discussed with her the pathology report extensively and explained what DCIS means and the significance of estrogen and progesterone receptors. I recommended initiating anastrozole 1 mg once daily to decrease the risk of recurrence of DCIS and the risk of breast cancer as well.  3. We discussed the risks and benefits of anti-estrogen therapy with aromatase inhibitors. These include but not limited to insomnia, hot flashes, mood changes, vaginal dryness, bone density loss, and weight gain. Although rare, serious side effects including endometrial cancer, risk of blood clots were also discussed. We strongly believe that the benefits far outweigh the risks. Patient understands these risks and consented to starting treatment. Planned treatment duration is 5 years.  Return to clinic in one month for toxicity evaluation.    No orders of the defined types were placed in this encounter.   The  patient has a good understanding of the overall plan. she agrees with it. She will call with any problems that may develop before her next visit here.  I spent 25 minutes counseling the patient face to face. The total time spent in the appointment was 30 minutes and more than 50% was on counseling and review of test results    Rulon Eisenmenger, MD 08/23/2013 9:34 AM

## 2013-08-24 ENCOUNTER — Telehealth: Payer: Self-pay | Admitting: Hematology and Oncology

## 2013-08-24 ENCOUNTER — Telehealth: Payer: Self-pay

## 2013-08-24 NOTE — Telephone Encounter (Signed)
s.w. pt and advised on Sept appt pt ok and aware °

## 2013-08-24 NOTE — Telephone Encounter (Signed)
Returned call to pt re: Take shape for life diet - soy based.  Let patient know she should not follow this diet any more.  Pt voiced understanding.

## 2013-09-20 ENCOUNTER — Ambulatory Visit (INDEPENDENT_AMBULATORY_CARE_PROVIDER_SITE_OTHER): Payer: Medicare Other | Admitting: General Surgery

## 2013-09-21 ENCOUNTER — Telehealth: Payer: Self-pay | Admitting: Hematology and Oncology

## 2013-09-21 ENCOUNTER — Ambulatory Visit (HOSPITAL_BASED_OUTPATIENT_CLINIC_OR_DEPARTMENT_OTHER): Payer: Medicare Other | Admitting: Hematology and Oncology

## 2013-09-21 VITALS — BP 143/70 | HR 56 | Temp 98.8°F | Resp 18 | Ht 63.0 in | Wt 174.2 lb

## 2013-09-21 DIAGNOSIS — Z170 Estrogen receptor positive status [ER+]: Secondary | ICD-10-CM

## 2013-09-21 DIAGNOSIS — C50511 Malignant neoplasm of lower-outer quadrant of right female breast: Secondary | ICD-10-CM

## 2013-09-21 DIAGNOSIS — D0590 Unspecified type of carcinoma in situ of unspecified breast: Secondary | ICD-10-CM

## 2013-09-21 NOTE — Progress Notes (Signed)
Patient Care Team: Rory Percy, MD as PCP - General (Cardiology)  DIAGNOSIS: Breast cancer of lower-outer quadrant of right female breast   Primary site: Breast (Right)   Staging method: AJCC 7th Edition   Clinical: Stage 0 (Tis (DCIS), N0, cM0)   Summary: Stage 0 (Tis (DCIS), N0, cM0)   Clinical comments: Staged at breast conference 04/18/13.   SUMMARY OF ONCOLOGIC HISTORY:   Breast cancer of lower-outer quadrant of right female breast   04/12/2013 Initial Diagnosis Right stereotactic core needle biopsy: Breast cancer of lower-outer quadrant of right female breast: High grade DCIS with comedo necrosis ER/PR positive   04/13/2013 Breast MRI Right breast 2.5 x 2.2 x 0.9 cm nodular enhancement   06/26/2013 -  Radiation Therapy Radiation to lumpectomy site by Dr. Sondra Come in [place] COMPLIANT: Patient is see her for one month after starting antiestrogen therapy  INTERVAL HISTORY: Cristina Douglas is a 78 year old Caucasian with above-mentioned history of right breast DCIS. She had lumpectomy followed by radiation and get started antiestrogen therapy with anastrozole. She is tolerating it very well without any major problems or concerns. Occasionally she had mild hot flashes and mild fatigue but she does not think it is related to antiestrogen therapy. She is very happy with how well she is tolerating it so far.  REVIEW OF SYSTEMS:   Constitutional: Denies fevers, chills or abnormal weight loss Eyes: Denies blurriness of vision Ears, nose, mouth, throat, and face: Denies mucositis or sore throat Respiratory: Denies cough, dyspnea or wheezes Cardiovascular: Denies palpitation, chest discomfort or lower extremity swelling Gastrointestinal:  Denies nausea, heartburn or change in bowel habits Skin: Denies abnormal skin rashes Lymphatics: Denies new lymphadenopathy or easy bruising Neurological:Denies numbness, tingling or new weaknesses Behavioral/Psych: Mood is stable, no new changes  Breast:   denies any pain or lumps or nodules in either breasts All other systems were reviewed with the patient and are negative.  I have reviewed the past medical history, past surgical history, social history and family history with the patient and they are unchanged from previous note.  ALLERGIES:  is allergic to sulfa antibiotics.  MEDICATIONS:  Current Outpatient Prescriptions  Medication Sig Dispense Refill  . anastrozole (ARIMIDEX) 1 MG tablet Take 1 tablet (1 mg total) by mouth daily.  90 tablet  3  . aspirin 81 MG tablet Take 81 mg by mouth daily.        Marland Kitchen atorvastatin (LIPITOR) 80 MG tablet Take 1 tablet by mouth Daily.      . Calcium Carbonate-Vitamin D (CALTRATE 600+D) 600-400 MG-UNIT per tablet Take 1 tablet by mouth 2 (two) times daily.       . cholecalciferol (VITAMIN D) 1000 UNITS tablet Take 1,000 Units by mouth daily.        . Coenzyme Q10 (CO Q 10) 100 MG CAPS Take 200 mg by mouth daily.      Marland Kitchen glucosamine-chondroitin 500-400 MG tablet Take 1 tablet by mouth daily.        Marland Kitchen levothyroxine (SYNTHROID, LEVOTHROID) 88 MCG tablet Take 88 mcg by mouth daily.        Marland Kitchen losartan-hydrochlorothiazide (HYZAAR) 50-12.5 MG per tablet Take 1 tablet by mouth 2 (two) times daily.        Marland Kitchen MAGNESIUM OXIDE PO Take 1 tablet by mouth daily.        . metFORMIN (GLUCOPHAGE-XR) 500 MG 24 hr tablet Take 1 tablet by mouth Twice daily.      . metoprolol tartrate (  LOPRESSOR) 25 MG tablet Take 25 mg by mouth 2 (two) times daily. 1/2 pill in morning and 1/2 pill at night.      . Misc Natural Products (OSTEO BI-FLEX JOINT SHIELD PO) Take by mouth daily.      . Multiple Vitamin (MULTIVITAMIN) tablet Take 1 tablet by mouth daily.        Marland Kitchen nystatin-triamcinolone (MYCOLOG II) cream       . Probiotic Product (PROBIOTIC DAILY PO) Take by mouth.       No current facility-administered medications for this visit.    PHYSICAL EXAMINATION: ECOG PERFORMANCE STATUS: 0 - Asymptomatic  Filed Vitals:   09/21/13 1015   BP: 143/70  Pulse: 56  Temp: 98.8 F (37.1 C)  Resp: 18   Filed Weights   09/21/13 1015  Weight: 174 lb 3.2 oz (79.017 kg)    GENERAL:alert, no distress and comfortable SKIN: skin color, texture, turgor are normal, no rashes or significant lesions EYES: normal, Conjunctiva are pink and non-injected, sclera clear OROPHARYNX:no exudate, no erythema and lips, buccal mucosa, and tongue normal  NECK: supple, thyroid normal size, non-tender, without nodularity LYMPH:  no palpable lymphadenopathy in the cervical, axillary or inguinal LUNGS: clear to auscultation and percussion with normal breathing effort HEART: regular rate & rhythm and no murmurs and no lower extremity edema ABDOMEN:abdomen soft, non-tender and normal bowel sounds Musculoskeletal:no cyanosis of digits and no clubbing  NEURO: alert & oriented x 3 with fluent speech, no focal motor/sensory deficits BREAST: No palpable masses or nodules in either right or left breasts. No palpable axillary supraclavicular or infraclavicular adenopathy no breast tenderness or nipple discharge.   LABORATORY DATA:  I have reviewed the data as listed   Chemistry      Component Value Date/Time   NA 138 04/18/2013 1233   K 3.6 04/18/2013 1233   CO2 30* 04/18/2013 1233   BUN 13.7 04/18/2013 1233   CREATININE 0.8 04/18/2013 1233      Component Value Date/Time   CALCIUM 10.2 04/18/2013 1233   ALKPHOS 60 04/18/2013 1233   AST 24 04/18/2013 1233   ALT 18 04/18/2013 1233   BILITOT 0.60 04/18/2013 1233       Lab Results  Component Value Date   WBC 8.4 04/18/2013   HGB 13.7 04/18/2013   HCT 41.3 04/18/2013   MCV 87.0 04/18/2013   PLT 326 04/18/2013   NEUTROABS 5.8 04/18/2013     RADIOGRAPHIC STUDIES: I have personally reviewed the radiology reports and agreed with their findings. No results found.   ASSESSMENT & PLAN:  Breast cancer of lower-outer quadrant of right female breast Right breast DCIS ER/PR positive status post lumpectomy  patient's daughter antiestrogen therapy with anastrozole. She is here for one-month followup and reports no new symptoms or concerns. Occasional hot flash but that is not bothering her. Mild fatigue with severe exertion which she thinks is due to her activity is not new to the medication.  Today's exam does not reveal any abnormalities. She will return back to see [place] in 3 months for routine followup.   No orders of the defined types were placed in this encounter.   The patient has a good understanding of the overall plan. she agrees with it. She will call with any problems that may develop before her next visit here.  I spent 15 minutes counseling the patient face to face. The total time spent in the appointment was 20 minutes and more than 50% was on counseling and  review of test results    Rulon Eisenmenger, MD 09/21/2013 11:03 AM

## 2013-09-21 NOTE — Assessment & Plan Note (Signed)
Right breast DCIS ER/PR positive status post lumpectomy patient's daughter antiestrogen therapy with anastrozole. She is here for one-month followup and reports no new symptoms or concerns. Occasional hot flash but that is not bothering her. Mild fatigue with severe exertion which she thinks is due to her activity is not new to the medication.  Today's exam does not reveal any abnormalities. She will return back to see [place] in 3 months for routine followup.

## 2013-09-21 NOTE — Telephone Encounter (Signed)
, °

## 2013-10-04 ENCOUNTER — Telehealth: Payer: Self-pay

## 2013-10-04 NOTE — Telephone Encounter (Signed)
Pt LM re: diarrhea.  Concerned may be side effect of Anastrazole however has had ultraflora added to her meds and metformin dose doubled. LMOVM - pt to return call to clinic.  Returned pt call - discussed with patient that Anastrazole has 8% incidence of diarrhea and metformin has 53% incidence of diarrhea.  Pt agreed she would Discuss with Dr. Chalmers Cater re: metformin.  Let pt know to call us back if the problem does not resolve.  Pt voiced understanding.

## 2013-10-05 ENCOUNTER — Encounter: Payer: Self-pay | Admitting: Cardiovascular Disease

## 2013-10-05 ENCOUNTER — Ambulatory Visit (INDEPENDENT_AMBULATORY_CARE_PROVIDER_SITE_OTHER): Payer: Medicare Other | Admitting: Cardiovascular Disease

## 2013-10-05 ENCOUNTER — Telehealth: Payer: Self-pay

## 2013-10-05 VITALS — BP 120/76 | HR 67 | Ht 63.0 in | Wt 172.5 lb

## 2013-10-05 DIAGNOSIS — C50911 Malignant neoplasm of unspecified site of right female breast: Secondary | ICD-10-CM

## 2013-10-05 DIAGNOSIS — E785 Hyperlipidemia, unspecified: Secondary | ICD-10-CM

## 2013-10-05 DIAGNOSIS — R002 Palpitations: Secondary | ICD-10-CM

## 2013-10-05 DIAGNOSIS — I10 Essential (primary) hypertension: Secondary | ICD-10-CM

## 2013-10-05 NOTE — Patient Instructions (Signed)
Continue all current medications. Your physician wants you to follow up in:  1 year.  You will receive a reminder letter in the mail one-two months in advance.  If you don't receive a letter, please call our office to schedule the follow up appointment   

## 2013-10-05 NOTE — Telephone Encounter (Signed)
Pt left msg reported Dr Chalmers Cater took her off Metformin until diarrhea resolved, then pt will be going to 1 tablet daily.

## 2013-10-05 NOTE — Progress Notes (Signed)
Patient ID: Cristina Douglas, female   DOB: [birth year], 78 y.o.   MRN: [id number]      SUBJECTIVE: Cristina Douglas is a 78 year-old female with a prior history of palpitations. Several years ago she wore a Holter monitor and was noted to have PVCs and bigeminy.  She was diagnosed with right breast DCIS earlier this year, and has received radiation and hormone therapy and underwent a lumpectomy. She continues to exercise four days per week at the [facility]. She denies chest pain, SOB, palpitations.   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 She and her husband enjoy reading quite a bit. She is a Writer of [employer] She is close friends with [name], who is also my patient.    Review of Systems: As per "subjective", otherwise negative.  Allergies  Allergen Reactions  . Sulfa Antibiotics     Current Outpatient Prescriptions  Medication Sig Dispense Refill  . anastrozole (ARIMIDEX) 1 MG tablet Take 1 tablet (1 mg total) by mouth daily.  90 tablet  3  . aspirin 81 MG tablet Take 81 mg by mouth daily.        Marland Kitchen atorvastatin (LIPITOR) 80 MG tablet Take 1 tablet by mouth Daily.      . Calcium Carbonate-Vitamin D (CALTRATE 600+D) 600-400 MG-UNIT per tablet Take 1 tablet by mouth 2 (two) times daily.       . cholecalciferol (VITAMIN D) 1000 UNITS tablet Take 1,000 Units by mouth daily.        . Coenzyme Q10 (CO Q 10) 100 MG CAPS Take 200 mg by mouth daily.      Marland Kitchen glucosamine-chondroitin 500-400 MG tablet Take 1 tablet by mouth daily.        Marland Kitchen levothyroxine (SYNTHROID, LEVOTHROID) 88 MCG tablet Take 88 mcg by mouth daily.        Marland Kitchen losartan-hydrochlorothiazide (HYZAAR) 50-12.5 MG per tablet Take 1 tablet by mouth 2 (two) times daily.        Marland Kitchen MAGNESIUM OXIDE PO Take 1 tablet by mouth daily.        . metoprolol tartrate (LOPRESSOR) 25 MG tablet Take 25 mg by mouth 2 (two) times  daily. 1/2 pill in morning and 1/2 pill at night.      . Misc Natural Products (OSTEO BI-FLEX JOINT SHIELD PO) Take by mouth daily.      . Multiple Vitamin (MULTIVITAMIN) tablet Take 1 tablet by mouth daily.        Marland Kitchen nystatin-triamcinolone (MYCOLOG II) cream       . Probiotic Product (PROBIOTIC DAILY PO) Take by mouth.      . metFORMIN (GLUCOPHAGE-XR) 500 MG 24 hr tablet Take 1 tablet by mouth 4 (four) times daily. Pt advised to hold by Dr. Alona Bene- Endocrinologist       No current facility-administered medications for this visit.    Past Medical History  Diagnosis Date  . Unspecified essential hypertension   . Other and unspecified hyperlipidemia   . Mitral valve insufficiency and aortic valve insufficiency     echo-2009-mild  . Hypothyroidism   . Cataract   . Basal cell carcinoma   . Diabetes mellitus without complication     Past Surgical History  Procedure Laterality Date  . Tonsillectomy    . Tubal ligation    . Appendectomy    . Colonoscopy    . Eye surgery  both cataracts  . Breast lumpectomy with radioactive seed localization Right 05/09/2013    Procedure: BREAST LUMPECTOMY WITH RADIOACTIVE SEED LOCALIZATION;  Surgeon: Adin Hector, MD;  Location: [facility];  Service: General;  Laterality: Right;    History   Social History  . Marital Status: Married    Spouse Name: [name]    Number of Children: N/A  . Years of Education: N/A   Occupational History  . RETIRED    Social History Main Topics  . Smoking status: Never Smoker   . Smokeless tobacco: Never Used  . Alcohol Use: Yes     Comment: occas- 10/2/15Rarely drinks wine - AJ  . Drug Use: No  . Sexual Activity: Not on file   Other Topics Concern  . Not on file   Social History Narrative  . No narrative on file     Filed Vitals:   10/05/13 1325  BP: 120/76  Pulse: 67  Height: 5\' 3"  (1.6 m)  Weight: 172 lb 8 oz (78.245 kg)  SpO2: 97%    PHYSICAL EXAM General: NAD HEENT:  Normal. Neck: No JVD, no thyromegaly. Lungs: Clear to auscultation bilaterally with normal respiratory effort. CV: Nondisplaced PMI.  Regular rate and rhythm, normal S1/S2, no S3/S4, no murmur. No pretibial or periankle edema.  No carotid bruit.  Normal pedal pulses.  Abdomen: Soft, nontender, no hepatosplenomegaly, no distention.  Neurologic: Alert and oriented x 3.  Psych: Normal affect. Skin: Normal. Musculoskeletal: Normal range of motion, no gross deformities. Extremities: No clubbing or cyanosis.   ECG: Most recent ECG reviewed.      ASSESSMENT AND PLAN: 1. Palpitations/PVC's: Symptomatically stable. Continue metoprolol 12.5 mg bid.  2. Essential HTN: Controlled on Hyzaar.  3. Hyperlipidemia: On Lipitor. Monitored by PCP.  Dispo: f/u 1 year.  Kate Sable, M.D., F.A.C.C.

## 2013-10-11 ENCOUNTER — Telehealth: Payer: Self-pay | Admitting: *Deleted

## 2013-10-11 NOTE — Telephone Encounter (Signed)
Patient called yesterday stating that she was having "colon pain" but took Motrin and pain was better. Called patient this morning and she stated that she slept well last night and felt better. She is having regular bowel movements but stated that she felt that stopping her Metformin may be making her lean towards constipation. She stated that she was going to take one Metformin tonight. She also has an appt with Dr. Collene Mares on Tuesday. Advised patient that if symptoms start again that she can call us. Patient verbalized understanding.

## 2013-12-20 ENCOUNTER — Telehealth: Payer: Self-pay | Admitting: Hematology and Oncology

## 2013-12-20 ENCOUNTER — Ambulatory Visit (HOSPITAL_BASED_OUTPATIENT_CLINIC_OR_DEPARTMENT_OTHER): Payer: Medicare Other | Admitting: Hematology and Oncology

## 2013-12-20 VITALS — BP 156/61 | HR 57 | Temp 97.9°F | Resp 18 | Ht 63.0 in | Wt 174.8 lb

## 2013-12-20 DIAGNOSIS — D0511 Intraductal carcinoma in situ of right breast: Secondary | ICD-10-CM

## 2013-12-20 DIAGNOSIS — Z170 Estrogen receptor positive status [ER+]: Secondary | ICD-10-CM

## 2013-12-20 DIAGNOSIS — C50511 Malignant neoplasm of lower-outer quadrant of right female breast: Secondary | ICD-10-CM

## 2013-12-20 NOTE — Telephone Encounter (Signed)
per pof to sch pt appt-gave pt copy of sch °

## 2013-12-20 NOTE — Progress Notes (Signed)
Patient Care Team: Rory Percy, MD as PCP - General (Cardiology)  DIAGNOSIS: Breast cancer of lower-outer quadrant of right female breast   Staging form: Breast, AJCC 7th Edition     Clinical: Stage 0 (Tis (DCIS), N0, cM0) - Unsigned       Staging comments: Staged at breast conference 04/18/13.      Pathologic: No stage assigned - Unsigned   SUMMARY OF ONCOLOGIC HISTORY:   Breast cancer of lower-outer quadrant of right female breast   04/12/2013 Initial Diagnosis Right stereotactic core needle biopsy: Breast cancer of lower-outer quadrant of right female breast: High grade DCIS with comedo necrosis ER/PR positive   04/13/2013 Breast MRI Right breast 2.5 x 2.2 x 0.9 cm nodular enhancement   06/26/2013 - 08/02/2013 Radiation Therapy Radiation to lumpectomy site by Dr. Sondra Come in [place]   08/14/2013 -  Anti-estrogen oral therapy Arimidex 1 mg daily    CHIEF COMPLIANT: Follow-up on Arimidex  INTERVAL HISTORY: Cristina Douglas is a 78 year old Caucasian with above-mentioned history of right-sided DCIS status post lumpectomy and radiation currently on Arimidex. Tolerating Arimidex extremely well without any major problems or concerns. She has slight arthritis but other than that denies any hot flashes or fatigue. Her husband is going to undergo spinal stenosis surgery in January.  REVIEW OF SYSTEMS:   Constitutional: Denies fevers, chills or abnormal weight loss Eyes: Denies blurriness of vision Ears, nose, mouth, throat, and face: Denies mucositis or sore throat Respiratory: Denies cough, dyspnea or wheezes Cardiovascular: Denies palpitation, chest discomfort or lower extremity swelling Gastrointestinal:  Denies nausea, heartburn or change in bowel habits Skin: Denies abnormal skin rashes Lymphatics: Denies new lymphadenopathy or easy bruising Neurological:Denies numbness, tingling or new weaknesses Behavioral/Psych: Mood is stable, no new changes  Breast:  denies any pain or lumps or nodules  in either breasts All other systems were reviewed with the patient and are negative.  I have reviewed the past medical history, past surgical history, social history and family history with the patient and they are unchanged from previous note.  ALLERGIES:  is allergic to sulfa antibiotics.  MEDICATIONS:  Current Outpatient Prescriptions  Medication Sig Dispense Refill  . anastrozole (ARIMIDEX) 1 MG tablet Take 1 tablet (1 mg total) by mouth daily. 90 tablet 3  . aspirin 81 MG tablet Take 81 mg by mouth daily.      Marland Kitchen atorvastatin (LIPITOR) 80 MG tablet Take 1 tablet by mouth Daily.    . Calcium Carbonate-Vitamin D (CALTRATE 600+D) 600-400 MG-UNIT per tablet Take 1 tablet by mouth 2 (two) times daily.     . cholecalciferol (VITAMIN D) 1000 UNITS tablet Take 1,000 Units by mouth daily.      . Coenzyme Q10 (CO Q 10) 100 MG CAPS Take 200 mg by mouth daily.    Marland Kitchen glucosamine-chondroitin 500-400 MG tablet Take 1 tablet by mouth daily.      Marland Kitchen levothyroxine (SYNTHROID, LEVOTHROID) 88 MCG tablet Take 88 mcg by mouth daily.      Marland Kitchen losartan-hydrochlorothiazide (HYZAAR) 50-12.5 MG per tablet Take 1 tablet by mouth 2 (two) times daily.      Marland Kitchen MAGNESIUM OXIDE PO Take 1 tablet by mouth daily.      . metFORMIN (GLUCOPHAGE-XR) 500 MG 24 hr tablet Take 1 tablet by mouth 4 (four) times daily. Pt advised to hold by Dr. Alona Bene- Endocrinologist    . metoprolol tartrate (LOPRESSOR) 25 MG tablet Take 25 mg by mouth 2 (two) times daily. 1/2 pill in morning and  1/2 pill at night.    . Misc Natural Products (OSTEO BI-FLEX JOINT SHIELD PO) Take by mouth daily.    . Multiple Vitamin (MULTIVITAMIN) tablet Take 1 tablet by mouth daily.      Marland Kitchen nystatin-triamcinolone (MYCOLOG II) cream     . Probiotic Product (PROBIOTIC DAILY PO) Take by mouth.     No current facility-administered medications for this visit.    PHYSICAL EXAMINATION: ECOG PERFORMANCE STATUS: 0 - Asymptomatic  Filed Vitals:   12/20/13 1020  BP:  156/61  Pulse: 57  Temp: 97.9 F (36.6 C)  Resp: 18   Filed Weights   12/20/13 1020  Weight: 174 lb 12.8 oz (79.289 kg)    GENERAL:alert, no distress and comfortable SKIN: skin color, texture, turgor are normal, no rashes or significant lesions EYES: normal, Conjunctiva are pink and non-injected, sclera clear OROPHARYNX:no exudate, no erythema and lips, buccal mucosa, and tongue normal  NECK: supple, thyroid normal size, non-tender, without nodularity LYMPH:  no palpable lymphadenopathy in the cervical, axillary or inguinal LUNGS: clear to auscultation and percussion with normal breathing effort HEART: regular rate & rhythm and no murmurs and no lower extremity edema ABDOMEN:abdomen soft, non-tender and normal bowel sounds Musculoskeletal:no cyanosis of digits and no clubbing  NEURO: alert & oriented x 3 with fluent speech, no focal motor/sensory deficits  LABORATORY DATA:  I have reviewed the data as listed   Chemistry      Component Value Date/Time   NA 138 04/18/2013 1233   K 3.6 04/18/2013 1233   CO2 30* 04/18/2013 1233   BUN 13.7 04/18/2013 1233   CREATININE 0.8 04/18/2013 1233      Component Value Date/Time   CALCIUM 10.2 04/18/2013 1233   ALKPHOS 60 04/18/2013 1233   AST 24 04/18/2013 1233   ALT 18 04/18/2013 1233   BILITOT 0.60 04/18/2013 1233       Lab Results  Component Value Date   WBC 8.4 04/18/2013   HGB 13.7 04/18/2013   HCT 41.3 04/18/2013   MCV 87.0 04/18/2013   PLT 326 04/18/2013   NEUTROABS 5.8 04/18/2013    ASSESSMENT & PLAN:  Breast cancer of lower-outer quadrant of right female breast Right breast DCIS ER/PR positive status post lumpectomy and radiation. Currently on antiestrogen therapy with anastrozole  Anastrozole toxicities: Slight arthritis but otherwise doing great  Breast cancer surveillance: 1. Mammogram will need to be scheduled for April 2016 2. Breast exam 09/21/2013 normal  Return to clinic in 6 months for  follow-up.     Orders Placed This Encounter  Procedures  . CBC with Differential    Standing Status: Future     Number of Occurrences:      Standing Expiration Date: 12/20/2014  . Comprehensive metabolic panel (Cmet) - [facility]    Standing Status: Future     Number of Occurrences:      Standing Expiration Date: 12/20/2014   The patient has a good understanding of the overall plan. she agrees with it. She will call with any problems that may develop before her next visit here.   Rulon Eisenmenger, MD 12/20/2013 10:26 AM

## 2013-12-20 NOTE — Assessment & Plan Note (Signed)
Right breast DCIS ER/PR positive status post lumpectomy and radiation. Currently on antiestrogen therapy with anastrozole  Anastrozole toxicities: Slight arthritis but otherwise doing great  Breast cancer surveillance: 1. Mammogram will need to be scheduled for April 2016 2. Breast exam 09/21/2013 normal  Return to clinic in 6 months for follow-up.

## 2013-12-20 NOTE — Addendum Note (Signed)
Addended by: Jonelle Sports K on: 12/20/2013 11:04 AM   Modules accepted: Medications

## 2013-12-21 NOTE — Telephone Encounter (Signed)
ECHO pre cert auth #[id number] sch appt-

## 2014-02-15 ENCOUNTER — Other Ambulatory Visit: Payer: Self-pay | Admitting: Obstetrics and Gynecology

## 2014-02-15 DIAGNOSIS — Z9889 Other specified postprocedural states: Secondary | ICD-10-CM

## 2014-02-15 DIAGNOSIS — Z853 Personal history of malignant neoplasm of breast: Secondary | ICD-10-CM

## 2014-03-20 ENCOUNTER — Encounter: Payer: Self-pay | Admitting: *Deleted

## 2014-03-20 NOTE — Progress Notes (Signed)
Received office notes from [facility], sent to scan.

## 2014-03-28 ENCOUNTER — Other Ambulatory Visit: Payer: Self-pay | Admitting: Cardiovascular Disease

## 2014-03-28 ENCOUNTER — Ambulatory Visit
Admission: RE | Admit: 2014-03-28 | Discharge: 2014-03-28 | Disposition: A | Discharge status: Home / Self Care | Payer: Medicare Other | Source: Ambulatory Visit | Attending: Obstetrics and Gynecology | Admitting: Obstetrics and Gynecology

## 2014-03-28 DIAGNOSIS — Z9889 Other specified postprocedural states: Secondary | ICD-10-CM

## 2014-03-28 DIAGNOSIS — Z853 Personal history of malignant neoplasm of breast: Secondary | ICD-10-CM

## 2014-05-23 ENCOUNTER — Ambulatory Visit (INDEPENDENT_AMBULATORY_CARE_PROVIDER_SITE_OTHER): Payer: Medicare Other | Admitting: Cardiovascular Disease

## 2014-05-23 ENCOUNTER — Encounter: Payer: Self-pay | Admitting: Cardiovascular Disease

## 2014-05-23 VITALS — BP 134/68 | HR 72 | Ht 63.0 in | Wt 174.0 lb

## 2014-05-23 DIAGNOSIS — I10 Essential (primary) hypertension: Secondary | ICD-10-CM

## 2014-05-23 DIAGNOSIS — E785 Hyperlipidemia, unspecified: Secondary | ICD-10-CM | POA: Diagnosis not present

## 2014-05-23 DIAGNOSIS — R002 Palpitations: Secondary | ICD-10-CM

## 2014-05-23 NOTE — Progress Notes (Signed)
Patient ID: Cristina Douglas, female   DOB: May 15, 1935, 79 y.o.   MRN: [id number]      SUBJECTIVE: Cristina Douglas is a 79 year-old female with a prior history of palpitations. Several years ago she wore a Holter monitor and was noted to have PVCs and bigeminy.  She was diagnosed with right breast DCIS in 2015, and received radiation and hormone therapy and underwent a lumpectomy.  She had been worried that her blood pressure was elevated but learned to find out that her blood pressure cuff at home is very old. Her blood pressure is normal today in our office at 134/68. She denies chest pain and palpitations and these have been well controlled with respect to her symptoms on metoprolol 12.5 mg twice daily. She has been under a lot of stress due to a dysfunctional printer as well as the fact that her husband recently underwent back surgery and she has been having to do a lot of heavy lifting. She said she is anxious by nature and has always been that way.  ECG performed today demonstrates sinus rhythm with an isolated PVC, and a nonspecific q wave in V1-2.  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 She and her husband enjoy reading quite a bit. She is a Writer of [employer] She is close friends with Cristina Douglas, who is also my patient.   Review of Systems: As per "subjective", otherwise negative.  Allergies  Allergen Reactions  . Sulfa Antibiotics     Current Outpatient Prescriptions  Medication Sig Dispense Refill  . anastrozole (ARIMIDEX) 1 MG tablet Take 1 tablet (1 mg total) by mouth daily. 90 tablet 3  . aspirin 81 MG tablet Take 81 mg by mouth daily.      Marland Kitchen atorvastatin (LIPITOR) 80 MG tablet Take 1 tablet by mouth Daily.    . Calcium Carbonate-Vitamin D (CALTRATE 600+D) 600-400 MG-UNIT per tablet Take 1 tablet by mouth 2 (two) times daily.     . cholecalciferol  (VITAMIN D) 1000 UNITS tablet Take 1,000 Units by mouth daily.      . Coenzyme Q10 (CO Q 10) 100 MG CAPS Take 50 mg by mouth daily.     Marland Kitchen glucosamine-chondroitin 500-400 MG tablet Take 1 tablet by mouth daily.      Marland Kitchen levothyroxine (SYNTHROID, LEVOTHROID) 88 MCG tablet Take 88 mcg by mouth daily.      Marland Kitchen losartan-hydrochlorothiazide (HYZAAR) 50-12.5 MG per tablet Take 1 tablet by mouth 2 (two) times daily.      Marland Kitchen MAGNESIUM OXIDE PO Take 1 tablet by mouth daily.      . metFORMIN (GLUCOPHAGE-XR) 500 MG 24 hr tablet Take 1 tablet by mouth 3 (three) times daily. Pt advised to hold by Dr. Alona Bene- Endocrinologist    . metoprolol tartrate (LOPRESSOR) 25 MG tablet Take 12.5 mg by mouth 2 (two) times daily.    . Misc Natural Products (OSTEO BI-FLEX JOINT SHIELD PO) Take by mouth 2 (two) times daily.     . Multiple Vitamin (MULTIVITAMIN) tablet Take 1 tablet by mouth daily.      Marland Kitchen nystatin-triamcinolone (MYCOLOG II) cream Apply 1 application topically as needed.     . Probiotic Product (PROBIOTIC DAILY PO) Take by mouth.     No current facility-administered medications for this visit.    Past Medical History  Diagnosis Date  . Unspecified essential hypertension   . Other  and unspecified hyperlipidemia   . Mitral valve insufficiency and aortic valve insufficiency     echo-2009-mild  . Hypothyroidism   . Cataract   . Basal cell carcinoma   . Diabetes mellitus without complication     Past Surgical History  Procedure Laterality Date  . Tonsillectomy    . Tubal ligation    . Appendectomy    . Colonoscopy    . Eye surgery      both cataracts  . Breast lumpectomy with radioactive seed localization Right 05/09/2013    Procedure: BREAST LUMPECTOMY WITH RADIOACTIVE SEED LOCALIZATION;  Surgeon: Adin Hector, MD;  Location: [facility];  Service: General;  Laterality: Right;    History   Social History  . Marital Status: Married    Spouse Name: Cristina Douglas  . Number of Children: N/A   . Years of Education: N/A   Occupational History  . RETIRED    Social History Main Topics  . Smoking status: Never Smoker   . Smokeless tobacco: Never Used  . Alcohol Use: Yes     Comment: occas- 10/2/15Rarely drinks wine - AJ  . Drug Use: No  . Sexual Activity: Not on file   Other Topics Concern  . Not on file   Social History Narrative     Filed Vitals:   05/23/14 1545  BP: 134/68  Pulse: 72  Height: 5\' 3"  (1.6 m)  Weight: 174 lb (78.926 kg)  SpO2: 96%    PHYSICAL EXAM General: NAD HEENT: Normal. Neck: No JVD, no thyromegaly. Lungs: Clear to auscultation bilaterally with normal respiratory effort. CV: Nondisplaced PMI.  Regular rate and rhythm with premature contraction followed by compensatory pause appreciated, normal S1/S2, no S3/S4, no murmur. No pretibial or periankle edema.  No carotid bruit.  Normal pedal pulses.  Abdomen: Soft, nontender, no hepatosplenomegaly, no distention.  Neurologic: Alert and oriented x 3.  Psych: Normal affect. Skin: Normal. Musculoskeletal: Normal range of motion, no gross deformities. Extremities: No clubbing or cyanosis.   ECG: Most recent ECG reviewed.      ASSESSMENT AND PLAN: 1. Palpitations/PVC's: Symptomatically stable. Continue metoprolol 12.5 mg bid.   2. Essential HTN: Controlled on Hyzaar. No changes to therapy.  3. Hyperlipidemia: On Lipitor. Monitored by PCP.  Dispo: f/u 1 year.   Kate Sable, M.D., F.A.C.C.

## 2014-05-23 NOTE — Patient Instructions (Signed)
Continue all current medications. Your physician wants you to follow up in:  1 year.  You will receive a reminder letter in the mail one-two months in advance.  If you don't receive a letter, please call our office to schedule the follow up appointment   

## 2014-06-26 NOTE — Assessment & Plan Note (Signed)
Breast cancer of lower-outer quadrant of right female breast Right breast DCIS ER/PR positive status post lumpectomy and radiation. Currently on antiestrogen therapy with anastrozole  Anastrozole toxicities: Slight arthritis but otherwise doing great  Breast cancer surveillance: 1. Mammogram 03/28/2014: Normal Breast density C 2. Breast exam 06/26/14 normal  Return to clinic in 6 months for follow-up.

## 2014-06-27 ENCOUNTER — Other Ambulatory Visit (HOSPITAL_BASED_OUTPATIENT_CLINIC_OR_DEPARTMENT_OTHER): Payer: Medicare Other

## 2014-06-27 ENCOUNTER — Ambulatory Visit (HOSPITAL_BASED_OUTPATIENT_CLINIC_OR_DEPARTMENT_OTHER): Payer: Medicare Other | Admitting: Hematology and Oncology

## 2014-06-27 ENCOUNTER — Telehealth: Payer: Self-pay | Admitting: Hematology and Oncology

## 2014-06-27 ENCOUNTER — Ambulatory Visit: Payer: Medicare Other | Admitting: Hematology and Oncology

## 2014-06-27 ENCOUNTER — Encounter: Payer: Self-pay | Admitting: Hematology and Oncology

## 2014-06-27 VITALS — BP 142/73 | HR 59 | Temp 98.0°F | Resp 18 | Ht 63.0 in | Wt 175.6 lb

## 2014-06-27 DIAGNOSIS — Z86000 Personal history of in-situ neoplasm of breast: Secondary | ICD-10-CM

## 2014-06-27 DIAGNOSIS — C50511 Malignant neoplasm of lower-outer quadrant of right female breast: Secondary | ICD-10-CM

## 2014-06-27 LAB — CBC WITH DIFFERENTIAL/PLATELET
BASO%: 0.5 % (ref 0.0–2.0)
Basophils Absolute: 0 10*3/uL (ref 0.0–0.1)
EOS%: 3.3 % (ref 0.0–7.0)
Eosinophils Absolute: 0.2 10*3/uL (ref 0.0–0.5)
HCT: 40.1 % (ref 34.8–46.6)
HGB: 13.7 g/dL (ref 11.6–15.9)
LYMPH%: 18.4 % (ref 14.0–49.7)
MCH: 29.6 pg (ref 25.1–34.0)
MCHC: 34.2 g/dL (ref 31.5–36.0)
MCV: 86.7 fL (ref 79.5–101.0)
MONO#: 0.8 10*3/uL (ref 0.1–0.9)
MONO%: 12.8 % (ref 0.0–14.0)
NEUT#: 4.1 10*3/uL (ref 1.5–6.5)
NEUT%: 65 % (ref 38.4–76.8)
Platelets: 315 10*3/uL (ref 145–400)
RBC: 4.63 10*6/uL (ref 3.70–5.45)
RDW: 14.1 % (ref 11.2–14.5)
WBC: 6.2 10*3/uL (ref 3.9–10.3)
lymph#: 1.1 10*3/uL (ref 0.9–3.3)

## 2014-06-27 LAB — COMPREHENSIVE METABOLIC PANEL (CC13)
ALT: 20 U/L (ref 0–55)
AST: 23 U/L (ref 5–34)
Albumin: 3.9 g/dL (ref 3.5–5.0)
Alkaline Phosphatase: 87 U/L (ref 40–150)
Anion Gap: 7 mEq/L (ref 3–11)
BUN: 13.5 mg/dL (ref 7.0–26.0)
CO2: 32 mEq/L — ABNORMAL HIGH (ref 22–29)
Calcium: 9.5 mg/dL (ref 8.4–10.4)
Chloride: 100 mEq/L (ref 98–109)
Creatinine: 0.8 mg/dL (ref 0.6–1.1)
EGFR: 68 mL/min/{1.73_m2} — ABNORMAL LOW (ref >90)
Glucose: 118 mg/dl (ref 70–140)
Potassium: 3.9 mEq/L (ref 3.5–5.1)
Sodium: 138 mEq/L (ref 136–145)
Total Bilirubin: 0.52 mg/dL (ref 0.20–1.20)
Total Protein: 6.7 g/dL (ref 6.4–8.3)

## 2014-06-27 NOTE — Progress Notes (Signed)
Patient Care Team: Rory Percy, MD as PCP - General (Cardiology)  DIAGNOSIS: Breast cancer of lower-outer quadrant of right female breast   Staging form: Breast, AJCC 7th Edition     Clinical: Stage 0 (Tis (DCIS), N0, cM0) - Unsigned       Staging comments: Staged at breast conference 04/18/13.      Pathologic: No stage assigned - Unsigned   SUMMARY OF ONCOLOGIC HISTORY:   Breast cancer of lower-outer quadrant of right female breast   04/12/2013 Initial Diagnosis Right stereotactic core needle biopsy: Breast cancer of lower-outer quadrant of right female breast: High grade DCIS with comedo necrosis ER/PR positive   04/13/2013 Breast MRI Right breast 2.5 x 2.2 x 0.9 cm nodular enhancement   06/26/2013 - 08/02/2013 Radiation Therapy Radiation to lumpectomy site by Dr. Sondra Come in [place]   08/14/2013 -  Anti-estrogen oral therapy Arimidex 1 mg daily    CHIEF COMPLIANT: Follow-up of right breast DCIS high-grade  INTERVAL HISTORY: Cristina Douglas is a 79 year old with above-mentioned history of right breast DCIS treated with lumpectomy and radiation and is now on Arimidex since August 2015. She is tolerating it extremely well without any major problems. Denies any hot flashes or muscle aches or pains. Denies any lumps or nodules in the breasts.  REVIEW OF SYSTEMS:   Constitutional: Denies fevers, chills or abnormal weight loss Eyes: Denies blurriness of vision Ears, nose, mouth, throat, and face: Denies mucositis or sore throat Respiratory: Denies cough, dyspnea or wheezes Cardiovascular: Denies palpitation, chest discomfort or lower extremity swelling Gastrointestinal:  Denies nausea, heartburn or change in bowel habits Skin: Denies abnormal skin rashes Lymphatics: Denies new lymphadenopathy or easy bruising Neurological:Denies numbness, tingling or new weaknesses Behavioral/Psych: Mood is stable, no new changes  Breast:  denies any pain or lumps or nodules in either breasts All other systems  were reviewed with the patient and are negative.  I have reviewed the past medical history, past surgical history, social history and family history with the patient and they are unchanged from previous note.  ALLERGIES:  is allergic to sulfa antibiotics.  MEDICATIONS:  Current Outpatient Prescriptions  Medication Sig Dispense Refill  . anastrozole (ARIMIDEX) 1 MG tablet Take 1 tablet (1 mg total) by mouth daily. 90 tablet 3  . aspirin 81 MG tablet Take 81 mg by mouth daily.      Marland Kitchen atorvastatin (LIPITOR) 80 MG tablet Take 1 tablet by mouth Daily.    . Calcium Carbonate-Vitamin D (CALTRATE 600+D) 600-400 MG-UNIT per tablet Take 1 tablet by mouth 2 (two) times daily.     . cholecalciferol (VITAMIN D) 1000 UNITS tablet Take 1,000 Units by mouth daily.      . Coenzyme Q10 (CO Q 10) 100 MG CAPS Take 50 mg by mouth daily.     Marland Kitchen glucosamine-chondroitin 500-400 MG tablet Take 1 tablet by mouth daily.      Marland Kitchen levothyroxine (SYNTHROID, LEVOTHROID) 88 MCG tablet Take 88 mcg by mouth daily.      Marland Kitchen losartan-hydrochlorothiazide (HYZAAR) 50-12.5 MG per tablet Take 1 tablet by mouth 2 (two) times daily.      Marland Kitchen MAGNESIUM OXIDE PO Take 1 tablet by mouth daily.      . metFORMIN (GLUCOPHAGE-XR) 500 MG 24 hr tablet Take 1 tablet by mouth 3 (three) times daily. Pt advised to hold by Dr. Alona Bene- Endocrinologist    . metoprolol tartrate (LOPRESSOR) 25 MG tablet Take 12.5 mg by mouth 2 (two) times daily.    Marland Kitchen  Misc Natural Products (OSTEO BI-FLEX JOINT SHIELD PO) Take by mouth 2 (two) times daily.     . Multiple Vitamin (MULTIVITAMIN) tablet Take 1 tablet by mouth daily.      Marland Kitchen nystatin-triamcinolone (MYCOLOG II) cream Apply 1 application topically as needed.     . Probiotic Product (PROBIOTIC DAILY PO) Take by mouth.     No current facility-administered medications for this visit.    PHYSICAL EXAMINATION: ECOG PERFORMANCE STATUS: 0 - Asymptomatic  Filed Vitals:   06/27/14 0840  BP: 142/73  Pulse: 59  Temp:  98 F (36.7 C)  Resp: 18   Filed Weights   06/27/14 0840  Weight: 175 lb 9.6 oz (79.652 kg)    GENERAL:alert, no distress and comfortable SKIN: skin color, texture, turgor are normal, no rashes or significant lesions EYES: normal, Conjunctiva are pink and non-injected, sclera clear OROPHARYNX:no exudate, no erythema and lips, buccal mucosa, and tongue normal  NECK: supple, thyroid normal size, non-tender, without nodularity LYMPH:  no palpable lymphadenopathy in the cervical, axillary or inguinal LUNGS: clear to auscultation and percussion with normal breathing effort HEART: regular rate & rhythm and no murmurs and no lower extremity edema ABDOMEN:abdomen soft, non-tender and normal bowel sounds Musculoskeletal:no cyanosis of digits and no clubbing  NEURO: alert & oriented x 3 with fluent speech, no focal motor/sensory deficits BREAST: No palpable masses or nodules in either right or left breasts. No palpable axillary supraclavicular or infraclavicular adenopathy no breast tenderness or nipple discharge. (exam performed in the presence of a chaperone)  LABORATORY DATA:  I have reviewed the data as listed   Chemistry      Component Value Date/Time   NA 138 04/18/2013 1233   K 3.6 04/18/2013 1233   CO2 30* 04/18/2013 1233   BUN 13.7 04/18/2013 1233   CREATININE 0.8 04/18/2013 1233      Component Value Date/Time   CALCIUM 10.2 04/18/2013 1233   ALKPHOS 60 04/18/2013 1233   AST 24 04/18/2013 1233   ALT 18 04/18/2013 1233   BILITOT 0.60 04/18/2013 1233       Lab Results  Component Value Date   WBC 6.2 06/27/2014   HGB 13.7 06/27/2014   HCT 40.1 06/27/2014   MCV 86.7 06/27/2014   PLT 315 06/27/2014   NEUTROABS 4.1 06/27/2014     RADIOGRAPHIC STUDIES: I have personally reviewed the radiology reports and agreed with their findings. Mammogram March 2016 is normal  ASSESSMENT & PLAN:   Breast cancer of lower-outer quadrant of right female breast Right breast DCIS  ER/PR positive status post lumpectomy and radiation. Currently on antiestrogen therapy with anastrozole  Anastrozole toxicities: Slight arthritis but otherwise doing great  Breast cancer surveillance: 1. Mammogram 03/28/2014: Normal Breast density C 2. Breast exam 06/26/14 normal  Return to clinic in 6 months for follow-up.   No orders of the defined types were placed in this encounter.   The patient has a good understanding of the overall plan. she agrees with it. she will call with any problems that may develop before the next visit here.   Rulon Eisenmenger, MD

## 2014-06-27 NOTE — Telephone Encounter (Signed)
Gave avs & calendar for December. °

## 2014-08-30 ENCOUNTER — Encounter: Payer: Self-pay | Admitting: Cardiovascular Disease

## 2014-08-30 ENCOUNTER — Ambulatory Visit (INDEPENDENT_AMBULATORY_CARE_PROVIDER_SITE_OTHER): Payer: Medicare Other | Admitting: Cardiovascular Disease

## 2014-08-30 VITALS — BP 112/52 | HR 43 | Ht 63.0 in | Wt 177.0 lb

## 2014-08-30 DIAGNOSIS — I493 Ventricular premature depolarization: Secondary | ICD-10-CM | POA: Diagnosis not present

## 2014-08-30 DIAGNOSIS — R002 Palpitations: Secondary | ICD-10-CM | POA: Diagnosis not present

## 2014-08-30 DIAGNOSIS — R197 Diarrhea, unspecified: Secondary | ICD-10-CM

## 2014-08-30 DIAGNOSIS — E860 Dehydration: Secondary | ICD-10-CM | POA: Diagnosis not present

## 2014-08-30 DIAGNOSIS — E785 Hyperlipidemia, unspecified: Secondary | ICD-10-CM

## 2014-08-30 DIAGNOSIS — I10 Essential (primary) hypertension: Secondary | ICD-10-CM

## 2014-08-30 DIAGNOSIS — R5383 Other fatigue: Secondary | ICD-10-CM | POA: Diagnosis not present

## 2014-08-30 NOTE — Patient Instructions (Signed)
Your physician recommends that you continue on your current medications as directed. Please refer to the Current Medication list given to you today. Your physician recommends that you have lab work today to check your BMET and CBC. Your physician recommends that you schedule a follow-up appointment in: 1 month.

## 2014-08-30 NOTE — Progress Notes (Signed)
Patient ID: Cristina Douglas, female   DOB: April 20, 1935, 79 y.o.   MRN: [id number]      SUBJECTIVE: Cristina Douglas is a 79 year-old female with a prior history of palpitations. Several years ago she wore a Holter monitor and was noted to have PVCs and bigeminy.  She was diagnosed with right breast DCIS in 2015, and received radiation and hormone therapy and underwent a lumpectomy.  She had been feeling markedly fatigued yesterday after grocery shopping. She had some mild lower abdominal discomfort. She then had frequent loose stools every 20 or 30 minutes. She has a prior h/o colon cancer and follows with Dr. Collene Mares in [place] (GI). She did not drink water or rehydrate in any way. Her blood pressure had been as low as 101/66 and ranged up to 127/76 today. The heart rate reading on her monitor was in the 40 beat per minute range.   ECG performed at her PCPs office earlier this morning demonstrated sinus rhythm with frequent PVCs in a pattern of ventricular bigeminy, HR 78 bpm. There were abnormalities in leads V1 and V2 suggestive of old septal infarction but these are nonspecific.  Denies chest pain and dyspnea, fevers, and chills.  Here with husband, [name], who lived in [place] for some time.  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 She and her husband enjoy reading quite a bit. She is a Writer of [employer] She is close friends with [name], who is also my patient.   Review of Systems: As per "subjective", otherwise negative.  Allergies  Allergen Reactions  . Sulfa Antibiotics     Current Outpatient Prescriptions  Medication Sig Dispense Refill  . anastrozole (ARIMIDEX) 1 MG tablet Take 1 tablet (1 mg total) by mouth daily. 90 tablet 3  . aspirin 81 MG tablet Take 81 mg by mouth daily.      Marland Kitchen atorvastatin (LIPITOR) 80 MG tablet Take 1 tablet by mouth Daily.    .  Calcium Carbonate-Vitamin D (CALTRATE 600+D) 600-400 MG-UNIT per tablet Take 1 tablet by mouth 2 (two) times daily.     . cholecalciferol (VITAMIN D) 1000 UNITS tablet Take 1,000 Units by mouth daily.      . Coenzyme Q10 (CO Q 10) 100 MG CAPS Take 50 mg by mouth daily.     Marland Kitchen glucosamine-chondroitin 500-400 MG tablet Take 1 tablet by mouth daily.      Marland Kitchen levothyroxine (SYNTHROID, LEVOTHROID) 88 MCG tablet Take 88 mcg by mouth daily.      Marland Kitchen losartan-hydrochlorothiazide (HYZAAR) 50-12.5 MG per tablet Take 1 tablet by mouth 2 (two) times daily.      Marland Kitchen MAGNESIUM OXIDE PO Take 1 tablet by mouth daily.      . metFORMIN (GLUCOPHAGE-XR) 500 MG 24 hr tablet Take 1 tablet by mouth 3 (three) times daily. Pt advised to hold by Dr. Alona Bene- Endocrinologist    . metoprolol tartrate (LOPRESSOR) 25 MG tablet Take 12.5 mg by mouth 2 (two) times daily.    . Misc Natural Products (OSTEO BI-FLEX JOINT SHIELD PO) Take by mouth 2 (two) times daily.     . Multiple Vitamin (MULTIVITAMIN) tablet Take 1 tablet by mouth daily.      Marland Kitchen nystatin-triamcinolone (MYCOLOG II) cream Apply 1 application topically as needed.     . Probiotic Product (PROBIOTIC DAILY PO) Take by mouth.     No current facility-administered medications for  this visit.    Past Medical History  Diagnosis Date  . Unspecified essential hypertension   . Other and unspecified hyperlipidemia   . Mitral valve insufficiency and aortic valve insufficiency     echo-2009-mild  . Hypothyroidism   . Cataract   . Basal cell carcinoma   . Diabetes mellitus without complication     Past Surgical History  Procedure Laterality Date  . Tonsillectomy    . Tubal ligation    . Appendectomy    . Colonoscopy    . Eye surgery      both cataracts  . Breast lumpectomy with radioactive seed localization Right 05/09/2013    Procedure: BREAST LUMPECTOMY WITH RADIOACTIVE SEED LOCALIZATION;  Surgeon: Adin Hector, MD;  Location: [facility];  Service:  General;  Laterality: Right;    Social History   Social History  . Marital Status: Married    Spouse Name: [name]  . Number of Children: N/A  . Years of Education: N/A   Occupational History  . RETIRED    Social History Main Topics  . Smoking status: Never Smoker   . Smokeless tobacco: Never Used  . Alcohol Use: Yes     Comment: occas- 10/2/15Rarely drinks wine - AJ  . Drug Use: No  . Sexual Activity: Not on file   Other Topics Concern  . Not on file   Social History Narrative     Filed Vitals:   08/30/14 1431  BP: 112/52  Pulse: 43  Height: 5\' 3"  (1.6 m)  Weight: 177 lb (80.287 kg)  SpO2: 95%    PHYSICAL EXAM General: NAD HEENT: Normal. Neck: No JVD, no thyromegaly. Lungs: Clear to auscultation bilaterally with normal respiratory effort. CV: Nondisplaced PMI.  Regular rate and rhythm with occasional premature contractions, normal S1/S2, no S3/S4, no murmur. No pretibial or periankle edema.   Abdomen: Soft, nontender, no distention.  Neurologic: Alert and oriented x 3.  Psych: Normal affect. Skin: Normal. Musculoskeletal: No gross deformities. Extremities: No clubbing or cyanosis.   ECG: Most recent ECG reviewed.      ASSESSMENT AND PLAN: 1. Palpitations/PVC's: I explained to the patient and her husband that automated monitors are notoriously inaccurate for detecting PVCs and that her heart rate was likely in the high 70 to low 80 beat per minute range. Continue metoprolol 12.5 mg bid.   2. Essential HTN: Low normal yesterday and normal today. On Hyzaar. No changes to therapy.  3. Hyperlipidemia: On Lipitor. Monitored by PCP.  4. Fatigue and dehydration: Likely secondary to dehydration given frequent loose stools and inadequate fluid repletement. I will check CBC and BMET. I encouraged her to drink 6-8 glasses of water or electrolyte solutions daily and to increase this amount if she is having frequent loose stools. If symptoms persist at next visit,  I may consider stress testing and/or echocardiography.  Dispo: f/u 1 month.  Time spent: 40 minutes, of which greater than 50% was spent reviewing symptoms, relevant blood tests and studies, and discussing management plan with the patient.   Kate Sable, M.D., F.A.C.C.

## 2014-09-09 ENCOUNTER — Other Ambulatory Visit: Payer: Self-pay | Admitting: Hematology and Oncology

## 2014-09-10 ENCOUNTER — Telehealth: Payer: Self-pay | Admitting: *Deleted

## 2014-09-10 MED ORDER — ANASTROZOLE 1 MG PO TABS: 1.0000 mg | ORAL_TABLET | Freq: Every day | ORAL | Status: DC

## 2014-09-10 NOTE — Telephone Encounter (Signed)
Last ov 06/27/14.   Next ov 12/26/14.  Chart reviewed.

## 2014-09-10 NOTE — Telephone Encounter (Signed)
LEFT VOICE MAIL ON PT.'S MOBILE PHEONE THAT ANASTROZOLE REFILL HAS BEEN COMPLETED.

## 2014-09-30 ENCOUNTER — Ambulatory Visit: Payer: Medicare Other | Admitting: Cardiovascular Disease

## 2014-10-14 ENCOUNTER — Telehealth: Payer: Self-pay | Admitting: *Deleted

## 2014-10-14 ENCOUNTER — Encounter: Payer: Self-pay | Admitting: Cardiovascular Disease

## 2014-10-14 ENCOUNTER — Ambulatory Visit (INDEPENDENT_AMBULATORY_CARE_PROVIDER_SITE_OTHER): Payer: Medicare Other | Admitting: Cardiovascular Disease

## 2014-10-14 VITALS — BP 132/56 | HR 39 | Ht 63.0 in | Wt 176.0 lb

## 2014-10-14 DIAGNOSIS — R197 Diarrhea, unspecified: Secondary | ICD-10-CM

## 2014-10-14 DIAGNOSIS — R002 Palpitations: Secondary | ICD-10-CM

## 2014-10-14 DIAGNOSIS — E785 Hyperlipidemia, unspecified: Secondary | ICD-10-CM

## 2014-10-14 DIAGNOSIS — I493 Ventricular premature depolarization: Secondary | ICD-10-CM | POA: Diagnosis not present

## 2014-10-14 DIAGNOSIS — I10 Essential (primary) hypertension: Secondary | ICD-10-CM

## 2014-10-14 DIAGNOSIS — R5383 Other fatigue: Secondary | ICD-10-CM | POA: Diagnosis not present

## 2014-10-14 DIAGNOSIS — E860 Dehydration: Secondary | ICD-10-CM | POA: Diagnosis not present

## 2014-10-14 NOTE — Telephone Encounter (Signed)
Patient called "requesting Tax ID number for Dr. Lindi Adie.  I brought in an [employer] form he signed last week.  However it also needs his nine digit Tax ID number.  Call my mobile [phone]) or home number [phone])."

## 2014-10-14 NOTE — Telephone Encounter (Signed)
Tax ID obtained from [employer].  Called Mrs. Mcglothen, provided [id number] which she is to write on the [employer] Form for cancer patients five years out.

## 2014-10-14 NOTE — Progress Notes (Signed)
Patient ID: Cristina Douglas, female   DOB: 10/28/1935, 79 y.o.   MRN: [id number]      SUBJECTIVE: Cristina Douglas is a 79 year-old female with a history of palpitations. Several years ago she wore a Holter monitor and was noted to have PVCs and bigeminy.  She was diagnosed with right breast DCIS in 2015, and received radiation and hormone therapy and underwent a lumpectomy. She has a prior h/o colon cancer and follows with Dr. Collene Mares in [place] (GI).  Denies chest pain, palpitations, dyspnea, and leg swelling.  She previously reported fatigue and dehydration with loose stools in August. I checked labs and CBC and renal function were normal. She stopped taking probiotic as frequently and her symptoms resolved.  She is feeling very well.   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 She and her husband Cristina Douglas, who lived in [place] in the past) enjoy reading quite a bit. She is a Writer of [employer] She is close friends with Cristina Douglas, who is also my patient. Son in [place] (runs [employer]), daughter [place]") in [place], and daughter Almyra Free) in [place].   Review of Systems: As per "subjective", otherwise negative.  Allergies  Allergen Reactions  . Sulfa Antibiotics     Current Outpatient Prescriptions  Medication Sig Dispense Refill  . anastrozole (ARIMIDEX) 1 MG tablet Take 1 tablet (1 mg total) by mouth daily. 90 tablet 1  . aspirin 81 MG tablet Take 81 mg by mouth daily.      Marland Kitchen atorvastatin (LIPITOR) 80 MG tablet Take 1 tablet by mouth Daily.    . Calcium Carbonate-Vitamin D (CALTRATE 600+D) 600-400 MG-UNIT per tablet Take 1 tablet by mouth 2 (two) times daily.     . cholecalciferol (VITAMIN D) 1000 UNITS tablet Take 1,000 Units by mouth daily.      . Coenzyme Q10 (CO Q 10) 100 MG CAPS Take 50 mg by mouth daily.     Marland Kitchen glucosamine-chondroitin 500-400 MG tablet Take 1  tablet by mouth daily.      Marland Kitchen levothyroxine (SYNTHROID, LEVOTHROID) 88 MCG tablet Take 88 mcg by mouth daily.      Marland Kitchen losartan-hydrochlorothiazide (HYZAAR) 50-12.5 MG per tablet Take 1 tablet by mouth 2 (two) times daily.      Marland Kitchen MAGNESIUM OXIDE PO Take 1 tablet by mouth daily.      . metFORMIN (GLUCOPHAGE-XR) 500 MG 24 hr tablet Take 1 tablet by mouth 3 (three) times daily. Pt advised to hold by Dr. Alona Bene- Endocrinologist    . metoprolol tartrate (LOPRESSOR) 25 MG tablet Take 12.5 mg by mouth 2 (two) times daily.    . Misc Natural Products (OSTEO BI-FLEX JOINT SHIELD PO) Take by mouth 2 (two) times daily.     . Multiple Vitamin (MULTIVITAMIN) tablet Take 1 tablet by mouth daily.      Marland Kitchen nystatin-triamcinolone (MYCOLOG II) cream Apply 1 application topically as needed.     . Probiotic Product (PROBIOTIC DAILY PO) Take by mouth.     No current facility-administered medications for this visit.    Past Medical History  Diagnosis Date  . Unspecified essential hypertension   . Other and unspecified hyperlipidemia   . Mitral valve insufficiency and aortic valve insufficiency     echo-2009-mild  . Hypothyroidism   . Cataract   . Basal cell carcinoma   . Diabetes mellitus without complication (HCC)     Past  Surgical History  Procedure Laterality Date  . Tonsillectomy    . Tubal ligation    . Appendectomy    . Colonoscopy    . Eye surgery      both cataracts  . Breast lumpectomy with radioactive seed localization Right 05/09/2013    Procedure: BREAST LUMPECTOMY WITH RADIOACTIVE SEED LOCALIZATION;  Surgeon: Adin Hector, MD;  Location: [facility];  Service: General;  Laterality: Right;    Social History   Social History  . Marital Status: Married    Spouse Name: [name]  . Number of Children: N/A  . Years of Education: N/A   Occupational History  . RETIRED    Social History Main Topics  . Smoking status: Never Smoker   . Smokeless tobacco: Never Used  .  Alcohol Use: Yes     Comment: occas- 10/2/15Rarely drinks wine - AJ  . Drug Use: No  . Sexual Activity: Not on file   Other Topics Concern  . Not on file   Social History Narrative     Filed Vitals:   10/14/14 0922  BP: 132/56  Pulse: 39  Height: 5\' 3"  (1.6 m)  Weight: 176 lb (79.833 kg)  SpO2: 97%    PHYSICAL EXAM General: NAD HEENT: Normal. Neck: No JVD, no thyromegaly. Lungs: Clear to auscultation bilaterally with normal respiratory effort. CV: Nondisplaced PMI. Regular rate and rhythm with occasional premature contractions, normal S1/S2, no S3/S4, no murmur. No pretibial or periankle edema.  Abdomen: Soft, nontender, no distention.  Neurologic: Alert and oriented x 3.  Psych: Normal affect. Skin: Normal. Musculoskeletal: No gross deformities. Extremities: No clubbing or cyanosis.   ECG: Most recent ECG reviewed.      ASSESSMENT AND PLAN: 1. Palpitations/PVC's: Symptomatically stable. I previously explained to the patient that automated monitors are notoriously inaccurate for detecting PVCs and that prior reported "low heart rates" were likely normal. Continue metoprolol 12.5 mg bid.   2. Essential HTN: Controlled. On Hyzaar. No changes to therapy.  3. Hyperlipidemia: On Lipitor. Monitored by PCP.  4. Fatigue and dehydration: Resolved completely. Prior episode likely secondary to dehydration given frequent loose stools and inadequate fluid repletement. Improved with taking less probiotic.   Dispo: f/u 1 year.  Kate Sable, M.D., F.A.C.C.

## 2014-10-14 NOTE — Patient Instructions (Signed)
Continue all current medications. Your physician wants you to follow up in:  1 year.  You will receive a reminder letter in the mail one-two months in advance.  If you don't receive a letter, please call our office to schedule the follow up appointment   

## 2014-12-26 ENCOUNTER — Ambulatory Visit: Payer: Medicare Other | Admitting: Hematology and Oncology

## 2014-12-26 ENCOUNTER — Ambulatory Visit (HOSPITAL_BASED_OUTPATIENT_CLINIC_OR_DEPARTMENT_OTHER): Payer: Medicare Other | Admitting: Hematology and Oncology

## 2014-12-26 ENCOUNTER — Telehealth: Payer: Self-pay | Admitting: Hematology and Oncology

## 2014-12-26 ENCOUNTER — Encounter: Payer: Self-pay | Admitting: Hematology and Oncology

## 2014-12-26 VITALS — BP 132/64 | HR 64 | Temp 98.4°F | Resp 18 | Wt 176.9 lb

## 2014-12-26 DIAGNOSIS — Z79811 Long term (current) use of aromatase inhibitors: Secondary | ICD-10-CM

## 2014-12-26 DIAGNOSIS — C50511 Malignant neoplasm of lower-outer quadrant of right female breast: Secondary | ICD-10-CM

## 2014-12-26 DIAGNOSIS — Z170 Estrogen receptor positive status [ER+]: Secondary | ICD-10-CM

## 2014-12-26 NOTE — Telephone Encounter (Signed)
Appointments made and avs printed for patient °

## 2014-12-26 NOTE — Assessment & Plan Note (Signed)
Right breast DCIS ER/PR positive status post lumpectomy and radiation. Currently on antiestrogen therapy with anastrozole  Started 08/14/2013  Anastrozole toxicities: Slight arthritis but otherwise doing great  Breast cancer surveillance: 1. Mammogram 03/28/2014: Normal Breast density C 2. Breast exam 12/26/14 normal  Return to clinic in 6 months for follow-up.

## 2014-12-26 NOTE — Progress Notes (Signed)
Patient Care Team: Rory Percy, MD as PCP - General (Cardiology)  DIAGNOSIS: Breast cancer of lower-outer quadrant of right female breast [facility])   Staging form: Breast, AJCC 7th Edition     Clinical: Stage 0 (Tis (DCIS), N0, cM0) - Unsigned       Staging comments: Staged at breast conference 04/18/13.      Pathologic: No stage assigned - Unsigned   SUMMARY OF ONCOLOGIC HISTORY:   Breast cancer of lower-outer quadrant of right female breast ([place])   04/12/2013 Initial Diagnosis Right stereotactic core needle biopsy: Breast cancer of lower-outer quadrant of right female breast: High grade DCIS with comedo necrosis ER/PR positive   04/13/2013 Breast MRI Right breast 2.5 x 2.2 x 0.9 cm nodular enhancement   06/26/2013 - 08/02/2013 Radiation Therapy Radiation to lumpectomy site by Dr. Sondra Come in [place]   08/14/2013 -  Anti-estrogen oral therapy Arimidex 1 mg daily    CHIEF COMPLIANT: follow-up on Arimidex  INTERVAL HISTORY: Cristina Douglas is a 79 year old with above-mentioned history of right breast cancer treated with lumpectomy radiation and Arimidex since August 2015. She has been tolerating it extremely well without any major problems or concerns. She denies any hot flashes. She has occasional night sweats. Denies any lumps or nodules in the breast. Her mammograms done in March 2016 were normal.  REVIEW OF SYSTEMS:   Constitutional: Denies fevers, chills or abnormal weight loss Eyes: Denies blurriness of vision Ears, nose, mouth, throat, and face: Denies mucositis or sore throat Respiratory: Denies cough, dyspnea or wheezes Cardiovascular: Denies palpitation, chest discomfort Gastrointestinal:  Denies nausea, heartburn or change in bowel habits Skin: Denies abnormal skin rashes Lymphatics: Denies new lymphadenopathy or easy bruising Neurological:Denies numbness, tingling or new weaknesses Behavioral/Psych: Mood is stable, no new changes  Extremities: No lower extremity edema Breast:   denies any pain or lumps or nodules in either breasts All other systems were reviewed with the patient and are negative.  I have reviewed the past medical history, past surgical history, social history and family history with the patient and they are unchanged from previous note.  ALLERGIES:  is allergic to sulfa antibiotics.  MEDICATIONS:  Current Outpatient Prescriptions  Medication Sig Dispense Refill  . anastrozole (ARIMIDEX) 1 MG tablet Take 1 tablet (1 mg total) by mouth daily. 90 tablet 1  . aspirin 81 MG tablet Take 81 mg by mouth daily.      Marland Kitchen atorvastatin (LIPITOR) 80 MG tablet Take 1 tablet by mouth Daily.    . Calcium Carbonate-Vitamin D (CALTRATE 600+D) 600-400 MG-UNIT per tablet Take 1 tablet by mouth 2 (two) times daily.     . cholecalciferol (VITAMIN D) 1000 UNITS tablet Take 1,000 Units by mouth daily.      . Coenzyme Q10 (CO Q 10) 100 MG CAPS Take 50 mg by mouth daily.     Marland Kitchen glucosamine-chondroitin 500-400 MG tablet Take 1 tablet by mouth daily.      Marland Kitchen levothyroxine (SYNTHROID, LEVOTHROID) 88 MCG tablet Take 88 mcg by mouth daily.      Marland Kitchen losartan-hydrochlorothiazide (HYZAAR) 50-12.5 MG per tablet Take 1 tablet by mouth 2 (two) times daily.      Marland Kitchen MAGNESIUM OXIDE PO Take 1 tablet by mouth daily.      . metFORMIN (GLUCOPHAGE-XR) 500 MG 24 hr tablet Take 1 tablet by mouth 3 (three) times daily. Pt advised to hold by Dr. Alona Bene- Endocrinologist    . metoprolol tartrate (LOPRESSOR) 25 MG tablet Take 12.5 mg by  mouth 2 (two) times daily.    . Misc Natural Products (OSTEO BI-FLEX JOINT SHIELD PO) Take by mouth 2 (two) times daily.     . Multiple Vitamin (MULTIVITAMIN) tablet Take 1 tablet by mouth daily.      Marland Kitchen nystatin-triamcinolone (MYCOLOG II) cream Apply 1 application topically as needed.     . Probiotic Product (PROBIOTIC DAILY PO) Take by mouth.     No current facility-administered medications for this visit.    PHYSICAL EXAMINATION: ECOG PERFORMANCE STATUS: 0 -  Asymptomatic  Filed Vitals:   12/26/14 1356  BP: 132/64  Pulse: 64  Temp: 98.4 F (36.9 C)  Resp: 18   Filed Weights   12/26/14 1356  Weight: 176 lb 14.4 oz (80.241 kg)    GENERAL:alert, no distress and comfortable SKIN: skin color, texture, turgor are normal, no rashes or significant lesions EYES: normal, Conjunctiva are pink and non-injected, sclera clear OROPHARYNX:no exudate, no erythema and lips, buccal mucosa, and tongue normal  NECK: supple, thyroid normal size, non-tender, without nodularity LYMPH:  no palpable lymphadenopathy in the cervical, axillary or inguinal LUNGS: clear to auscultation and percussion with normal breathing effort HEART: regular rate & rhythm and no murmurs and no lower extremity edema ABDOMEN:abdomen soft, non-tender and normal bowel sounds MUSCULOSKELETAL:no cyanosis of digits and no clubbing  NEURO: alert & oriented x 3 with fluent speech, no focal motor/sensory deficits EXTREMITIES: No lower extremity edema BREAST: No palpable masses or nodules in either right or left breasts. No palpable axillary supraclavicular or infraclavicular adenopathy no breast tenderness or nipple discharge. (exam performed in the presence of a chaperone)  LABORATORY DATA:  I have reviewed the data as listed   Chemistry      Component Value Date/Time   NA 138 06/27/2014 0814   K 3.9 06/27/2014 0814   CO2 32* 06/27/2014 0814   BUN 13.5 06/27/2014 0814   CREATININE 0.8 06/27/2014 0814      Component Value Date/Time   CALCIUM 9.5 06/27/2014 0814   ALKPHOS 87 06/27/2014 0814   AST 23 06/27/2014 0814   ALT 20 06/27/2014 0814   BILITOT 0.52 06/27/2014 0814       Lab Results  Component Value Date   WBC 6.2 06/27/2014   HGB 13.7 06/27/2014   HCT 40.1 06/27/2014   MCV 86.7 06/27/2014   PLT 315 06/27/2014   NEUTROABS 4.1 06/27/2014     ASSESSMENT & PLAN:  Breast cancer of lower-outer quadrant of right female breast Right breast DCIS ER/PR positive status  post lumpectomy and radiation. Currently on antiestrogen therapy with anastrozole  Started 08/14/2013  Anastrozole toxicities: Slight arthritis but otherwise doing great  Breast cancer surveillance: 1. Mammogram 03/28/2014: Normal Breast density C 2. Breast exam 12/26/14 normal  Return to clinic in 6 months for follow-up.   No orders of the defined types were placed in this encounter.   The patient has a good understanding of the overall plan. she agrees with it. she will call with any problems that may develop before the next visit here.   Rulon Eisenmenger, MD 12/26/2014

## 2015-03-03 ENCOUNTER — Other Ambulatory Visit: Payer: Self-pay | Admitting: Obstetrics and Gynecology

## 2015-03-03 DIAGNOSIS — Z853 Personal history of malignant neoplasm of breast: Secondary | ICD-10-CM

## 2015-04-03 ENCOUNTER — Inpatient Hospital Stay: Admission: RE | Admit: 2015-04-03 | Payer: Medicare Other | Source: Ambulatory Visit

## 2015-04-10 ENCOUNTER — Ambulatory Visit
Admission: RE | Admit: 2015-04-10 | Discharge: 2015-04-10 | Disposition: A | Discharge status: Home / Self Care | Payer: Medicare Other | Source: Ambulatory Visit | Attending: Obstetrics and Gynecology | Admitting: Obstetrics and Gynecology

## 2015-04-10 DIAGNOSIS — Z853 Personal history of malignant neoplasm of breast: Secondary | ICD-10-CM

## 2015-06-25 ENCOUNTER — Other Ambulatory Visit: Payer: Self-pay | Admitting: Cardiovascular Disease

## 2015-06-25 ENCOUNTER — Other Ambulatory Visit: Payer: Self-pay | Admitting: *Deleted

## 2015-06-25 MED ORDER — METOPROLOL TARTRATE 25 MG PO TABS
12.5000 mg | ORAL_TABLET | Freq: Two times a day (BID) | ORAL | Status: DC
Start: 2015-06-25 — End: 2015-06-25

## 2015-06-25 MED ORDER — METOPROLOL TARTRATE 25 MG PO TABS: 12.5000 mg | ORAL_TABLET | Freq: Two times a day (BID) | ORAL | Status: DC

## 2015-06-26 ENCOUNTER — Telehealth: Payer: Self-pay | Admitting: Hematology and Oncology

## 2015-06-26 ENCOUNTER — Encounter: Payer: Self-pay | Admitting: Hematology and Oncology

## 2015-06-26 ENCOUNTER — Ambulatory Visit (HOSPITAL_BASED_OUTPATIENT_CLINIC_OR_DEPARTMENT_OTHER): Payer: Medicare Other | Admitting: Hematology and Oncology

## 2015-06-26 VITALS — BP 151/70 | HR 63 | Temp 98.5°F | Resp 18 | Wt 171.7 lb

## 2015-06-26 DIAGNOSIS — C50511 Malignant neoplasm of lower-outer quadrant of right female breast: Secondary | ICD-10-CM | POA: Diagnosis not present

## 2015-06-26 NOTE — Assessment & Plan Note (Signed)
Right breast DCIS ER/PR positive status post lumpectomy and radiation. Currently on antiestrogen therapy with anastrozole Started 08/14/2013  Anastrozole toxicities: Slight arthritis but otherwise doing great  Breast cancer surveillance: 1. Mammogram 04/10/2015: Benign Breast density B 2. Breast exam 06/24/2015 normal  Return to clinic in 1 year for follow-up.

## 2015-06-26 NOTE — Progress Notes (Signed)
Patient Care Team: Rory Percy, MD as PCP - General (Cardiology)  DIAGNOSIS: Breast cancer of lower-outer quadrant of right female breast [facility])   Staging form: Breast, AJCC 7th Edition     Clinical: Stage 0 (Tis (DCIS), N0, cM0) - Unsigned       Staging comments: Staged at breast conference 04/18/13.      Pathologic: No stage assigned - Unsigned   SUMMARY OF ONCOLOGIC HISTORY:   Breast cancer of lower-outer quadrant of right female breast ([place])   04/12/2013 Initial Diagnosis Right stereotactic core needle biopsy: Breast cancer of lower-outer quadrant of right female breast: High grade DCIS with comedo necrosis ER/PR positive   04/13/2013 Breast MRI Right breast 2.5 x 2.2 x 0.9 cm nodular enhancement   06/26/2013 - 08/02/2013 Radiation Therapy Radiation to lumpectomy site by Dr. Sondra Come in [place]   08/14/2013 -  Anti-estrogen oral therapy Arimidex 1 mg daily    CHIEF COMPLIANT: Follow-up on anastrozole  INTERVAL HISTORY: Cristina Douglas is a 80 year old with above-mentioned history of right breast cancer treated with lumpectomy, radiation and is currently on anastrozole. She appears to be tolerating extremely well. She does not report any major problems or concerns. She denies any hot flashes or arthritis.  REVIEW OF SYSTEMS:   Constitutional: Denies fevers, chills or abnormal weight loss Eyes: Denies blurriness of vision Ears, nose, mouth, throat, and face: Denies mucositis or sore throat Respiratory: Denies cough, dyspnea or wheezes Cardiovascular: Denies palpitation, chest discomfort Gastrointestinal:  Denies nausea, heartburn or change in bowel habits Skin: Denies abnormal skin rashes Lymphatics: Denies new lymphadenopathy or easy bruising Neurological:Denies numbness, tingling or new weaknesses Behavioral/Psych: Mood is stable, no new changes  Extremities: No lower extremity edema Breast:  denies any pain or lumps or nodules in either breasts All other systems were reviewed with  the patient and are negative.  I have reviewed the past medical history, past surgical history, social history and family history with the patient and they are unchanged from previous note.  ALLERGIES:  is allergic to sulfa antibiotics.  MEDICATIONS:  Current Outpatient Prescriptions  Medication Sig Dispense Refill  . anastrozole (ARIMIDEX) 1 MG tablet Take 1 tablet (1 mg total) by mouth daily. 90 tablet 1  . aspirin 81 MG tablet Take 81 mg by mouth daily.      Marland Kitchen atorvastatin (LIPITOR) 80 MG tablet Take 1 tablet by mouth Daily.    . Calcium Carbonate-Vitamin D (CALTRATE 600+D) 600-400 MG-UNIT per tablet Take 1 tablet by mouth 2 (two) times daily.     . cholecalciferol (VITAMIN D) 1000 UNITS tablet Take 1,000 Units by mouth daily.      . Coenzyme Q10 (CO Q 10) 100 MG CAPS Take 50 mg by mouth daily.     Marland Kitchen glucosamine-chondroitin 500-400 MG tablet Take 1 tablet by mouth daily.      Marland Kitchen levothyroxine (SYNTHROID, LEVOTHROID) 88 MCG tablet Take 88 mcg by mouth daily.      Marland Kitchen losartan-hydrochlorothiazide (HYZAAR) 50-12.5 MG per tablet Take 1 tablet by mouth 2 (two) times daily.      Marland Kitchen MAGNESIUM OXIDE PO Take 1 tablet by mouth daily.      . metFORMIN (GLUCOPHAGE-XR) 500 MG 24 hr tablet Take 1 tablet by mouth 3 (three) times daily. Pt advised to hold by Dr. Alona Bene- Endocrinologist    . metoprolol tartrate (LOPRESSOR) 25 MG tablet Take 0.5 tablets (12.5 mg total) by mouth 2 (two) times daily. 90 tablet 1  . Misc Natural Products (  OSTEO BI-FLEX JOINT SHIELD PO) Take by mouth 2 (two) times daily.     . Multiple Vitamin (MULTIVITAMIN) tablet Take 1 tablet by mouth daily.      Marland Kitchen nystatin-triamcinolone (MYCOLOG II) cream Apply 1 application topically as needed.     . Probiotic Product (PROBIOTIC DAILY PO) Take by mouth.     No current facility-administered medications for this visit.    PHYSICAL EXAMINATION: ECOG PERFORMANCE STATUS: 0 - Asymptomatic  Filed Vitals:   06/26/15 0937  BP: 151/70  Pulse:  63  Temp: 98.5 F (36.9 C)  Resp: 18   Filed Weights   06/26/15 0937  Weight: 171 lb 11.2 oz (77.883 kg)    GENERAL:alert, no distress and comfortable SKIN: skin color, texture, turgor are normal, no rashes or significant lesions EYES: normal, Conjunctiva are pink and non-injected, sclera clear OROPHARYNX:no exudate, no erythema and lips, buccal mucosa, and tongue normal  NECK: supple, thyroid normal size, non-tender, without nodularity LYMPH:  no palpable lymphadenopathy in the cervical, axillary or inguinal LUNGS: clear to auscultation and percussion with normal breathing effort HEART: regular rate & rhythm and no murmurs and no lower extremity edema ABDOMEN:abdomen soft, non-tender and normal bowel sounds MUSCULOSKELETAL:no cyanosis of digits and no clubbing  NEURO: alert & oriented x 3 with fluent speech, no focal motor/sensory deficits EXTREMITIES: No lower extremity edema BREAST: No palpable masses or nodules in either right or left breasts. No palpable axillary supraclavicular or infraclavicular adenopathy no breast tenderness or nipple discharge. (exam performed in the presence of a chaperone)  LABORATORY DATA:  I have reviewed the data as listed   Chemistry      Component Value Date/Time   NA 138 06/27/2014 0814   K 3.9 06/27/2014 0814   CO2 32* 06/27/2014 0814   BUN 13.5 06/27/2014 0814   CREATININE 0.8 06/27/2014 0814      Component Value Date/Time   CALCIUM 9.5 06/27/2014 0814   ALKPHOS 87 06/27/2014 0814   AST 23 06/27/2014 0814   ALT 20 06/27/2014 0814   BILITOT 0.52 06/27/2014 0814       Lab Results  Component Value Date   WBC 6.2 06/27/2014   HGB 13.7 06/27/2014   HCT 40.1 06/27/2014   MCV 86.7 06/27/2014   PLT 315 06/27/2014   NEUTROABS 4.1 06/27/2014     ASSESSMENT & PLAN:  Breast cancer of lower-outer quadrant of right female breast Right breast DCIS ER/PR positive status post lumpectomy and radiation. Currently on antiestrogen therapy with  anastrozole Started 08/14/2013  Anastrozole toxicities: Slight arthritis but otherwise doing great  Breast cancer surveillance: 1. Mammogram 04/10/2015: Benign Breast density B 2. Breast exam 06/26/2015 normal  Return to clinic in 1 year for follow-up.   No orders of the defined types were placed in this encounter.   The patient has a good understanding of the overall plan. she agrees with it. she will call with any problems that may develop before the next visit here.   Rulon Eisenmenger, MD 06/26/2015

## 2015-06-26 NOTE — Telephone Encounter (Signed)
appt made and avs printed °

## 2015-09-16 ENCOUNTER — Encounter: Payer: Self-pay | Admitting: Cardiovascular Disease

## 2015-09-16 ENCOUNTER — Ambulatory Visit (INDEPENDENT_AMBULATORY_CARE_PROVIDER_SITE_OTHER): Payer: Medicare Other | Admitting: Cardiovascular Disease

## 2015-09-16 VITALS — BP 130/76 | HR 77 | Ht 63.0 in | Wt 176.0 lb

## 2015-09-16 DIAGNOSIS — E785 Hyperlipidemia, unspecified: Secondary | ICD-10-CM

## 2015-09-16 DIAGNOSIS — R002 Palpitations: Secondary | ICD-10-CM | POA: Diagnosis not present

## 2015-09-16 DIAGNOSIS — I493 Ventricular premature depolarization: Secondary | ICD-10-CM | POA: Diagnosis not present

## 2015-09-16 DIAGNOSIS — I10 Essential (primary) hypertension: Secondary | ICD-10-CM

## 2015-09-16 NOTE — Progress Notes (Signed)
SUBJECTIVE: Cristina Douglas presents for routine follow up. She has a history of palpitations. Several years ago she wore a Holter monitor and was noted to have PVCs and bigeminy.  She was diagnosed with right breast DCIS in 2015, and received radiation and hormone therapy and underwent a lumpectomy. She has a prior h/o colon cancer and follows with Dr. Collene Douglas in [place] (GI).  Has a lot of anxiety. Said BP was up over weekend due to concerns about grandson's health issues and other family stressors.    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 She and her husband Cristina Douglas, who lived in [place] in the past) enjoy reading quite a bit. She is a Writer of [employer] She is close friends with Cristina Douglas, who is also my patient. Son in [place] (runs [employer]), daughter [place]") in [place], and daughter Almyra Free) in [place].   Review of Systems: As per "subjective", otherwise negative.  Allergies  Allergen Reactions  . Sulfa Antibiotics     Current Outpatient Prescriptions  Medication Sig Dispense Refill  . anastrozole (ARIMIDEX) 1 MG tablet Take 1 tablet (1 mg total) by mouth daily. 90 tablet 1  . aspirin 81 MG tablet Take 81 mg by mouth daily.      Marland Kitchen atorvastatin (LIPITOR) 80 MG tablet Take 1 tablet by mouth Daily.    . Calcium Carbonate-Vitamin D (CALTRATE 600+D) 600-400 MG-UNIT per tablet Take 1 tablet by mouth 2 (two) times daily.     . cholecalciferol (VITAMIN D) 1000 UNITS tablet Take 1,000 Units by mouth daily.      . Coenzyme Q10 (CO Q 10) 100 MG CAPS Take 50 mg by mouth daily.     Marland Kitchen glucosamine-chondroitin 500-400 MG tablet Take 1 tablet by mouth daily.      Marland Kitchen levothyroxine (SYNTHROID, LEVOTHROID) 88 MCG tablet Take 88 mcg by mouth daily.      Marland Kitchen losartan-hydrochlorothiazide (HYZAAR) 50-12.5 MG per tablet Take 1 tablet by mouth 2 (two) times daily.      Marland Kitchen  MAGNESIUM OXIDE PO Take 1 tablet by mouth daily.      . metFORMIN (GLUCOPHAGE-XR) 500 MG 24 hr tablet Take 1 tablet by mouth 3 (three) times daily. Pt advised to hold by Dr. Alona Douglas- Endocrinologist    . metoprolol tartrate (LOPRESSOR) 25 MG tablet Take 0.5 tablets (12.5 mg total) by mouth 2 (two) times daily. 90 tablet 1  . Misc Natural Products (OSTEO BI-FLEX JOINT SHIELD PO) Take by mouth 2 (two) times daily.     . Multiple Vitamin (MULTIVITAMIN) tablet Take 1 tablet by mouth daily.      Marland Kitchen nystatin-triamcinolone (MYCOLOG II) cream Apply 1 application topically as needed.     . Probiotic Product (PROBIOTIC DAILY PO) Take by mouth.     No current facility-administered medications for this visit.     Past Medical History:  Diagnosis Date  . Basal cell carcinoma   . Cataract   . Diabetes mellitus without complication ([place])   . Hypothyroidism   . Mitral valve insufficiency and aortic valve insufficiency    echo-2009-mild  . Other and unspecified hyperlipidemia   . Unspecified essential hypertension     Past Surgical History:  Procedure Laterality Date  . APPENDECTOMY    . BREAST LUMPECTOMY WITH RADIOACTIVE SEED LOCALIZATION Right 05/09/2013   Procedure: BREAST LUMPECTOMY WITH RADIOACTIVE SEED LOCALIZATION;  Surgeon: Cristina Hector,  MD;  Location: [facility];  Service: General;  Laterality: Right;  . COLONOSCOPY    . EYE SURGERY     both cataracts  . TONSILLECTOMY    . TUBAL LIGATION      Social History   Social History  . Marital status: Married    Spouse name: Cristina Douglas  . Number of children: N/A  . Years of education: N/A   Occupational History  . RETIRED    Social History Main Topics  . Smoking status: Never Smoker  . Smokeless tobacco: Never Used  . Alcohol use Yes     Comment: occas- 10/2/15Rarely drinks wine - AJ  . Drug use: No  . Sexual activity: Not on file   Other Topics Concern  . Not on file   Social History Narrative  . No narrative on  file     Vitals:   09/16/15 0956  Weight: 176 lb (79.8 kg)  Height: 5\' 3"  (1.6 m)    PHYSICAL EXAM General: NAD HEENT: Normal. Neck: No JVD, no thyromegaly. Lungs: Clear to auscultation bilaterally with normal respiratory effort. CV: Nondisplaced PMI. Regular rate and rhythm with occasional premature contractions, normal S1/S2, no S3/S4, no murmur. No pretibial or periankle edema.  Abdomen: Soft, nontender, no distention.  Neurologic: Alert and oriented x 3.  Psych: Normal affect. Skin: Normal. Musculoskeletal: No gross deformities. Extremities: No clubbing or cyanosis.     ECG: Most recent ECG reviewed.      ASSESSMENT AND PLAN: 1. Palpitations/PVC's: Symptomatically stable. I previously explained to the patient that automated monitors are notoriously inaccurate for detecting PVCs and that prior reported "low heart rates" were likely normal. Continue metoprolol 12.5 mg bid.   2. Essential HTN: Controlled. On Hyzaar. No changes to therapy.  3. Hyperlipidemia: On Lipitor. Monitored by PCP.  Dispo: fu 1 year.  Cristina Douglas, M.D., F.A.C.C.

## 2015-09-16 NOTE — Patient Instructions (Signed)

## 2015-09-30 ENCOUNTER — Telehealth: Payer: Self-pay | Admitting: Cardiovascular Disease

## 2015-09-30 NOTE — Telephone Encounter (Signed)
Patient just realized today that she had not been taking her cholesterol med x 2 months.  Stated her husband had been taking care of of her pill box.  Her Metformin is in a similar tall bottle & somehow just left it out.  Stated that she just had her labs done with her Endocrinologist (Dr. Caleb Popp) and cholesterol numbers had gone up a lot.  Per Dr. Bronson Ing last [place] dictation, hyperlipidemia managed by PCP.  Patient stated that she only sees Dr. Nadara Mustard yearly, but sees Balan every 6 months.  States she always does her labs for her now instead of primary.  Suggested she see if Dr. Chalmers Cater is willing to manage since she is doing labs routinely anyway.  If not, let office know and we can manage accordingly.  Also suggested that she start back on her Lipitor tonight.  Patient agrees & will discuss further at her upcoming visit this Thursday with Dr. Chalmers Cater.

## 2015-09-30 NOTE — Telephone Encounter (Signed)
Patient called stating that she has not taken her cholesterol medication in 2 months.

## 2015-10-01 ENCOUNTER — Other Ambulatory Visit: Payer: Self-pay | Admitting: Hematology and Oncology

## 2016-01-02 ENCOUNTER — Other Ambulatory Visit: Payer: Self-pay | Admitting: Pharmacist

## 2016-01-02 NOTE — Patient Outreach (Signed)
Outreach call to [facility] regarding medication adherence to atorvastatin. Spoke with patient's husband who declined to have patient come to phone.  Harlow Asa, PharmD, [facility] [phone]

## 2016-02-24 ENCOUNTER — Other Ambulatory Visit: Payer: Self-pay | Admitting: Obstetrics and Gynecology

## 2016-02-24 DIAGNOSIS — Z853 Personal history of malignant neoplasm of breast: Secondary | ICD-10-CM

## 2016-03-17 ENCOUNTER — Other Ambulatory Visit: Payer: Self-pay | Admitting: Cardiovascular Disease

## 2016-04-09 IMAGING — MR MR BREAST BILATERAL W WO CONTRAST
9 of 13 series · 32 of 48 positions shown · IV contrast (16cc multihance)
Comparison: Previous exams

CLINICAL DATA: Recently biopsy proven right lower outer quadrant
DCIS manifesting as mammographically detected calcifications.

LABS:  BUN and creatinine were obtained on site at [HOSPITAL]
[HOSPITAL].
Results:  BUN 11 mg/dL,  Creatinine 0.8 mg/dL.
EXAM:
BILATERAL BREAST MRI WITH AND WITHOUT CONTRAST
TECHNIQUE: Multiplanar, multisequence MR images of both breasts were obtained
prior to and following the intravenous administration of 16ml of
MultiHance.

[Series 2: T2 · axial · 3.0mm · 0.96mm/px · z∈[-84,+78]mm · 3 of 55 slices shown]
[im 1/55]
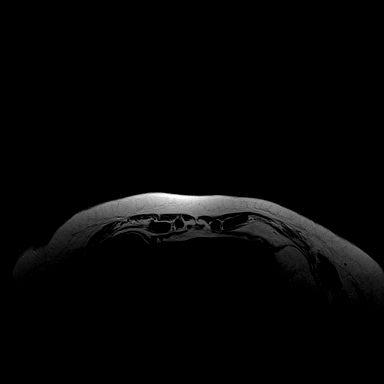
[im 28/55]
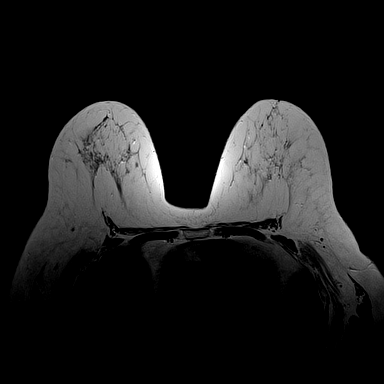
[im 55/55]
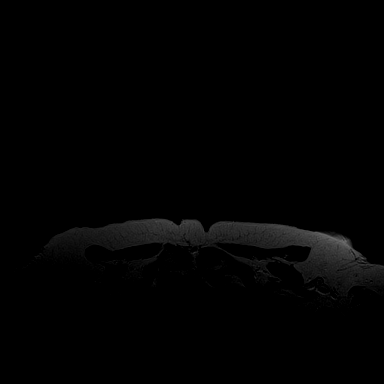

[Series 3: t2_tirm_tra ipat (a-p) · axial · 3.0mm · 0.72mm/px · z∈[-84,+78]mm · 2 of 55 slices shown]
[im 1/55]
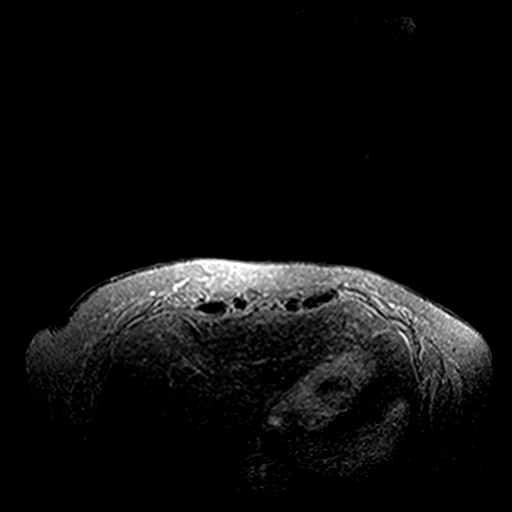
[im 55/55]
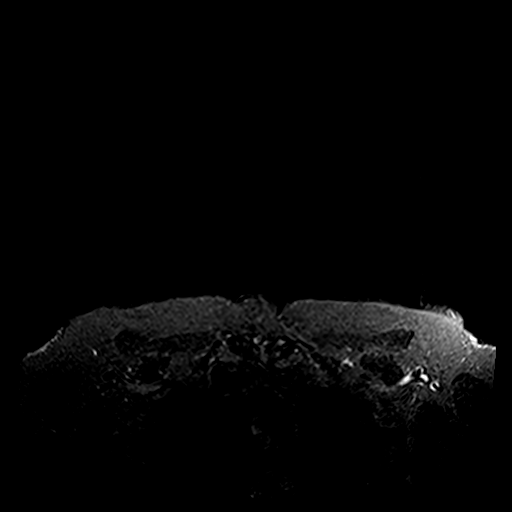

[Series 4: fl3d pre-cm no · axial · non-contrast · 1.2mm · 0.96mm/px · z∈[-89,+83]mm · 5 of 144 slices shown]
[im 1/144]
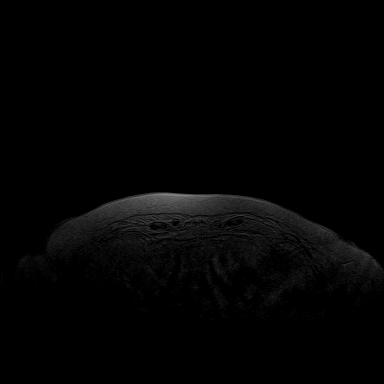
[im 36/144]
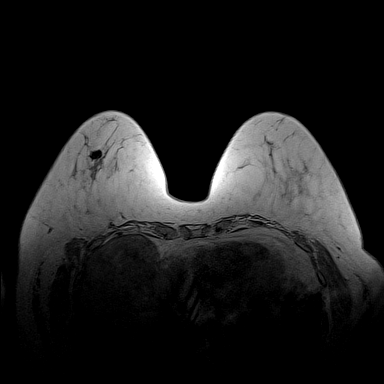
[im 72/144]
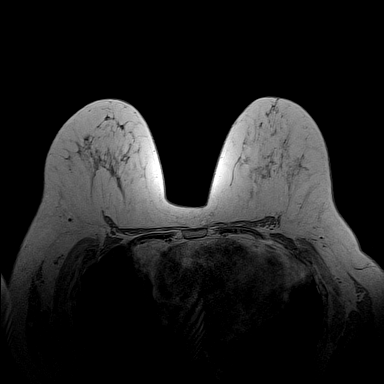
[im 108/144]
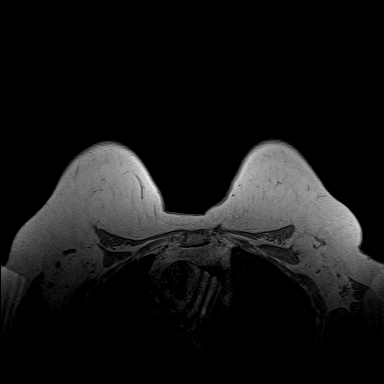
[im 144/144]
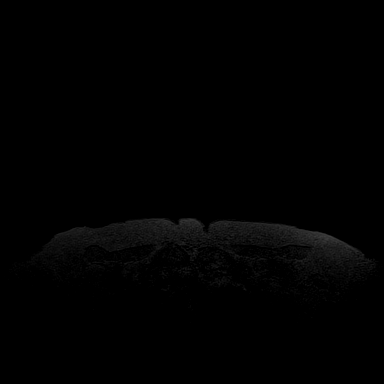

[Series 5: fl3d pre-cm · axial · non-contrast · 1.2mm · 0.96mm/px · z∈[-89,+83]mm · 5 of 144 slices shown]
[im 1/144]
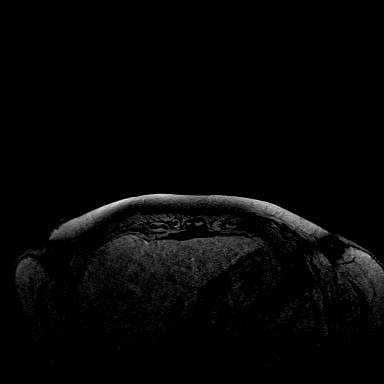
[im 36/144]
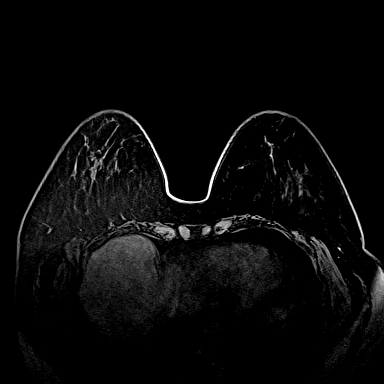
[im 72/144]
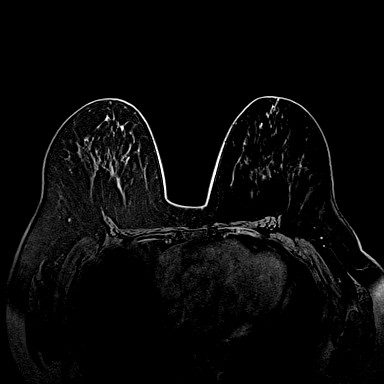
[im 108/144]
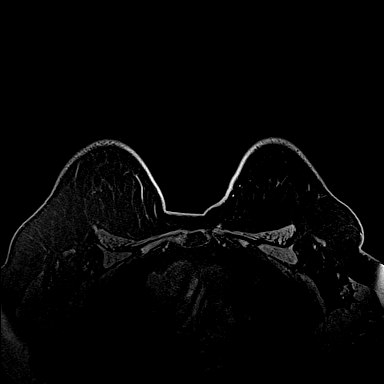
[im 144/144]
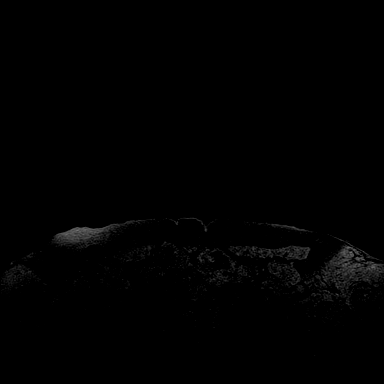

[Series 6: fl3d post-cm 20 · axial · 1.2mm · 0.96mm/px · z∈[-89,+83]mm · 5 of 144 slices shown (1 of 3)]
[im 1/144]
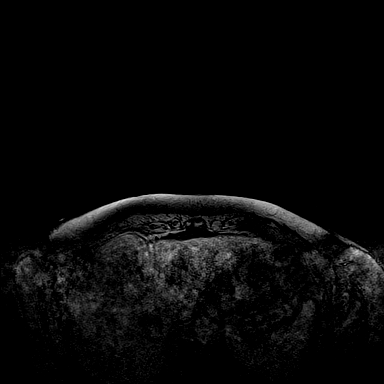
[im 36/144]
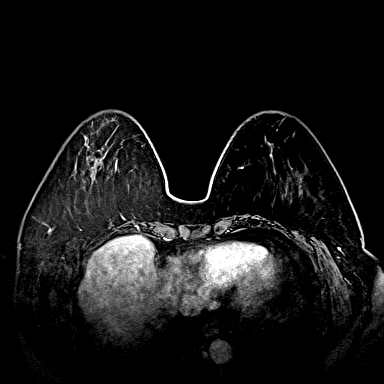
[im 72/144]
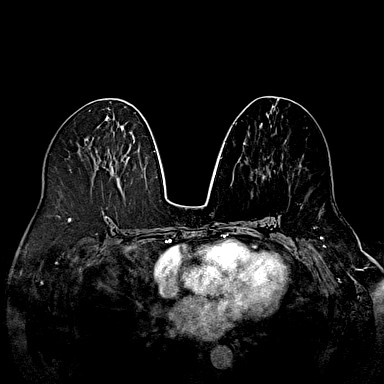
[im 108/144]
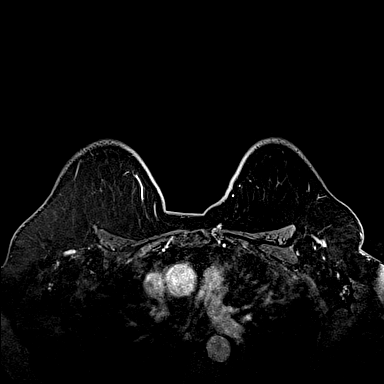
[im 144/144]
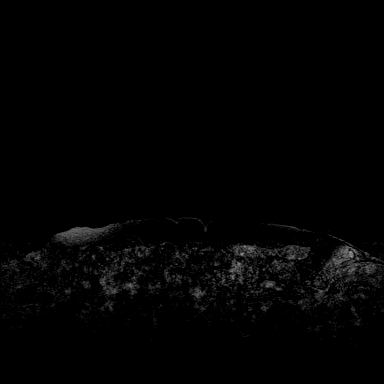

[Series 7: fl3d post-cm 20 · axial · 1.2mm · 0.96mm/px · z∈[-89,+83]mm · 5 of 144 slices shown (2 of 3)]
[im 1/144]
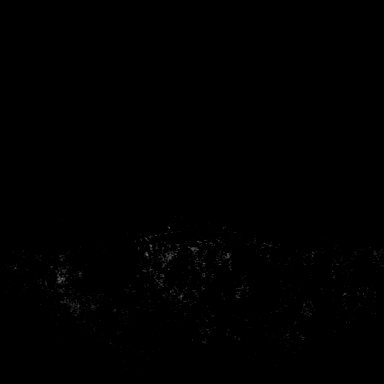
[im 36/144]
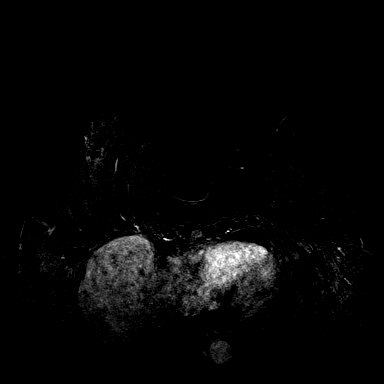
[im 72/144]
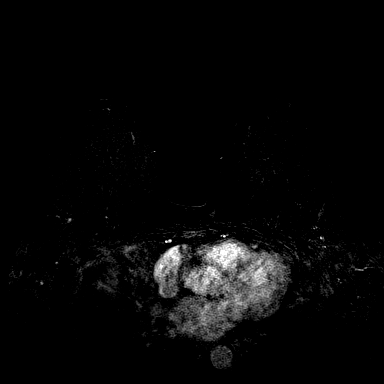
[im 108/144]
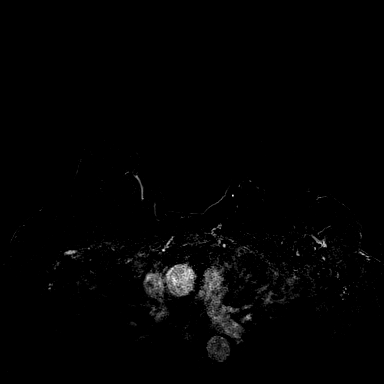
[im 144/144]
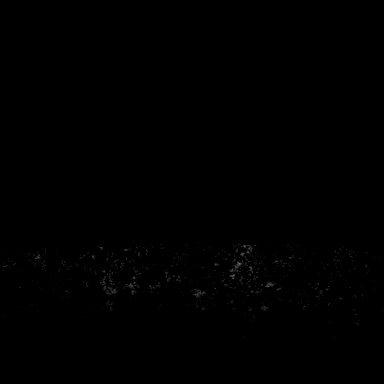

[Series 8: fl3d post-cm 20 · axial · 172.8mm · 0.96mm/px · 1 of 1 slices shown (3 of 3)]
[im 1/1]
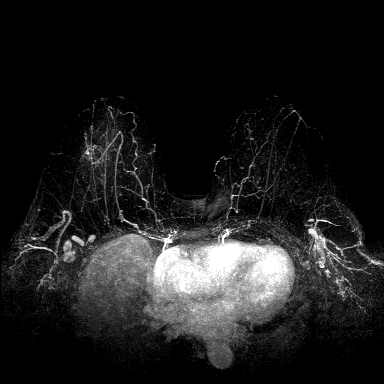

[Series 9: fl3d post-cm 3min · axial · 1.2mm · 0.96mm/px · z∈[-89,+83]mm · 5 of 144 slices shown]
[im 1/144]
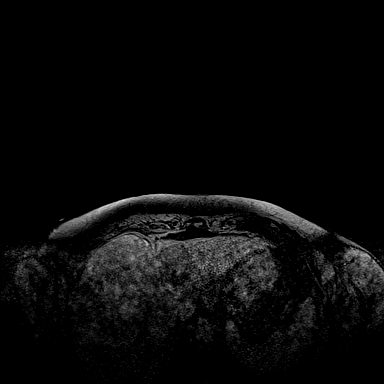
[im 36/144]
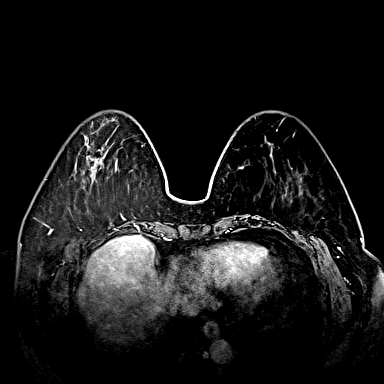
[im 72/144]
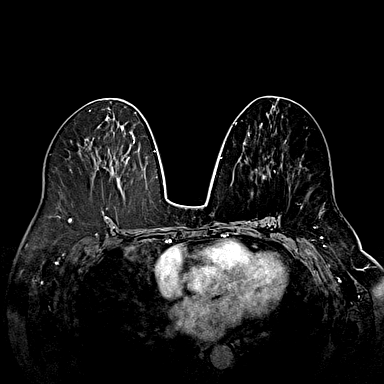
[im 108/144]
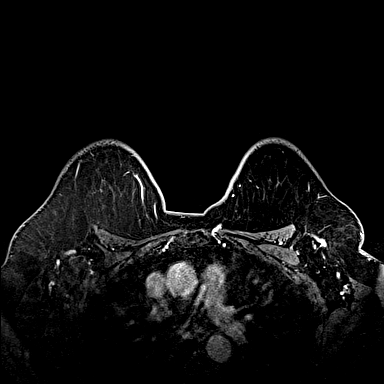
[im 144/144]
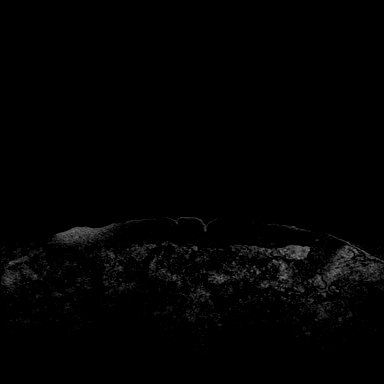

[Series 10: fl3d post-cm 3min_sub · axial · 1.2mm · 0.96mm/px · 1 of 144 slices shown]
[im 1/144]
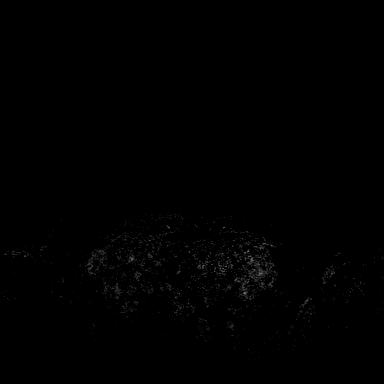

[32 of 48 positions shown; findings below may reference images not displayed]

THREE-DIMENSIONAL MR IMAGE RENDERING ON INDEPENDENT WORKSTATION:

Three-dimensional MR images were rendered by post-processing of the
original MR data on an independent workstation. The
three-dimensional MR images were interpreted, and findings are
reported in the following complete MRI report for this study. Three
dimensional images were evaluated at the independent [employer]
workstation
FINDINGS: Breast composition: b.  Scattered fibroglandular tissue.

Background parenchymal enhancement: Minimal

Right breast: In the right lower outer quadrant, containing clip
artifact, is an area of clumped nodular enhancement corresponding to
the recently biopsy proven DCIS, measuring 2.5 x 2.2 x 0.9 cm. This
demonstrates plateau type enhancement kinetics.

Left breast: No mass or abnormal enhancement.

Lymph nodes: No abnormal appearing lymph nodes.

Ancillary findings:  None.
IMPRESSION: 2.5 cm area of clumped nodular enhancement corresponding to the
biopsy proven right lower outer quadrant DCIS. No MRI evidence for
multifocal/multicentric or contralateral malignancy.

RECOMMENDATION:
Treatment plan

BI-RADS CATEGORY  6: Known biopsy-proven malignancy - appropriate
action should be taken.

## 2016-04-14 ENCOUNTER — Ambulatory Visit
Admission: RE | Admit: 2016-04-14 | Discharge: 2016-04-14 | Disposition: A | Discharge status: Home / Self Care | Payer: Medicare Other | Source: Ambulatory Visit | Attending: Obstetrics and Gynecology | Admitting: Obstetrics and Gynecology

## 2016-04-14 DIAGNOSIS — Z853 Personal history of malignant neoplasm of breast: Secondary | ICD-10-CM

## 2016-04-14 HISTORY — DX: Personal history of irradiation: Z92.3

## 2016-06-17 ENCOUNTER — Telehealth: Payer: Self-pay | Admitting: Hematology and Oncology

## 2016-06-17 NOTE — Telephone Encounter (Signed)
Left message on VM about appointment change on 6/20 and a call back number

## 2016-06-24 ENCOUNTER — Ambulatory Visit: Payer: Medicare Other | Admitting: Hematology and Oncology

## 2016-07-06 ENCOUNTER — Ambulatory Visit (HOSPITAL_BASED_OUTPATIENT_CLINIC_OR_DEPARTMENT_OTHER): Payer: Medicare Other | Admitting: Hematology and Oncology

## 2016-07-06 ENCOUNTER — Encounter: Payer: Self-pay | Admitting: Hematology and Oncology

## 2016-07-06 DIAGNOSIS — C50511 Malignant neoplasm of lower-outer quadrant of right female breast: Secondary | ICD-10-CM | POA: Diagnosis not present

## 2016-07-06 DIAGNOSIS — Z170 Estrogen receptor positive status [ER+]: Secondary | ICD-10-CM | POA: Diagnosis not present

## 2016-07-06 MED ORDER — ANASTROZOLE 1 MG PO TABS: 1.0000 mg | ORAL_TABLET | Freq: Every day | ORAL | 3 refills | Status: DC

## 2016-07-06 MED ORDER — METFORMIN HCL ER 500 MG PO TB24: 500.0000 mg | ORAL_TABLET | Freq: Two times a day (BID) | ORAL | Status: DC

## 2016-07-06 NOTE — Progress Notes (Signed)
Patient Care Team: Rory Percy, MD as PCP - General (Cardiology)  DIAGNOSIS:  Encounter Diagnosis  Name Primary?  . Malignant neoplasm of lower-outer quadrant of right breast of female, estrogen receptor positive ([place])     SUMMARY OF ONCOLOGIC HISTORY:   Breast cancer of lower-outer quadrant of right female breast ([place])   04/12/2013 Initial Diagnosis    Right stereotactic core needle biopsy: Breast cancer of lower-outer quadrant of right female breast: High grade DCIS with comedo necrosis ER/PR positive      04/13/2013 Breast MRI    Right breast 2.5 x 2.2 x 0.9 cm nodular enhancement      06/26/2013 - 08/02/2013 Radiation Therapy    Radiation to lumpectomy site by Dr. Sondra Come in [place]      08/14/2013 -  Anti-estrogen oral therapy    Arimidex 1 mg daily       CHIEF COMPLIANT: Follow-up on Arimidex therapy  INTERVAL HISTORY: Cristina Douglas is a 81 year old with above-mentioned history of right breast DCIS was currently on Arimidex therapy and tolerating it extremely well. She does not have any hot flashes or myalgias. She denies any lumps or nodules in the breasts.  REVIEW OF SYSTEMS:   Constitutional: Denies fevers, chills or abnormal weight loss Eyes: Denies blurriness of vision Ears, nose, mouth, throat, and face: Denies mucositis or sore throat Respiratory: Denies cough, dyspnea or wheezes Cardiovascular: Denies palpitation, chest discomfort Gastrointestinal:  Denies nausea, heartburn or change in bowel habits Skin: Denies abnormal skin rashes Lymphatics: Denies new lymphadenopathy or easy bruising Neurological:Denies numbness, tingling or new weaknesses Behavioral/Psych: Mood is stable, no new changes  Extremities: No lower extremity edema Breast:  denies any pain or lumps or nodules in either breasts All other systems were reviewed with the patient and are negative.  I have reviewed the past medical history, past surgical history, social history and family  history with the patient and they are unchanged from previous note.  ALLERGIES:  is allergic to sulfa antibiotics.  MEDICATIONS:  Current Outpatient Prescriptions  Medication Sig Dispense Refill  . anastrozole (ARIMIDEX) 1 MG tablet Take 1 tablet (1 mg total) by mouth daily. 90 tablet 1  . anastrozole (ARIMIDEX) 1 MG tablet TAKE 1 TABLET BY MOUTH EVERY DAY 90 tablet 3  . aspirin 81 MG tablet Take 81 mg by mouth daily.      Marland Kitchen atorvastatin (LIPITOR) 80 MG tablet Take 1 tablet by mouth Daily.    . Calcium Carbonate-Vitamin D (CALTRATE 600+D) 600-400 MG-UNIT per tablet Take 1 tablet by mouth 2 (two) times daily.     . cholecalciferol (VITAMIN D) 1000 UNITS tablet Take 1,000 Units by mouth daily.      . Coenzyme Q10 (CO Q 10) 100 MG CAPS Take 50 mg by mouth daily.     Marland Kitchen glucosamine-chondroitin 500-400 MG tablet Take 1 tablet by mouth daily.      Marland Kitchen levothyroxine (SYNTHROID, LEVOTHROID) 88 MCG tablet Take 88 mcg by mouth daily.      Marland Kitchen losartan-hydrochlorothiazide (HYZAAR) 50-12.5 MG per tablet Take 1 tablet by mouth 2 (two) times daily.      Marland Kitchen MAGNESIUM OXIDE PO Take 1 tablet by mouth daily.      . metFORMIN (GLUCOPHAGE-XR) 500 MG 24 hr tablet Take 1 tablet by mouth 3 (three) times daily. Pt advised to hold by Dr. Alona Bene- Endocrinologist    . metoprolol tartrate (LOPRESSOR) 25 MG tablet TAKE 1/2 TABLET BY MOUTH TWICE DAILY 90 tablet 1  . Misc  Natural Products (OSTEO BI-FLEX JOINT SHIELD PO) Take by mouth 2 (two) times daily.     . Multiple Vitamin (MULTIVITAMIN) tablet Take 1 tablet by mouth daily.      . Probiotic Product (PROBIOTIC DAILY PO) Take by mouth.     No current facility-administered medications for this visit.     PHYSICAL EXAMINATION: ECOG PERFORMANCE STATUS: 0 - Asymptomatic  Vitals:   07/06/16 1149  BP: (!) 154/56  Pulse: 67  Resp: 18  Temp: 97.7 F (36.5 C)   Filed Weights   07/06/16 1149  Weight: 177 lb 8 oz (80.5 kg)    GENERAL:alert, no distress and  comfortable SKIN: skin color, texture, turgor are normal, no rashes or significant lesions EYES: normal, Conjunctiva are pink and non-injected, sclera clear OROPHARYNX:no exudate, no erythema and lips, buccal mucosa, and tongue normal  NECK: supple, thyroid normal size, non-tender, without nodularity LYMPH:  no palpable lymphadenopathy in the cervical, axillary or inguinal LUNGS: clear to auscultation and percussion with normal breathing effort HEART: regular rate & rhythm and no murmurs and no lower extremity edema ABDOMEN:abdomen soft, non-tender and normal bowel sounds MUSCULOSKELETAL:no cyanosis of digits and no clubbing  NEURO: alert & oriented x 3 with fluent speech, no focal motor/sensory deficits EXTREMITIES: No lower extremity edema BREAST: No palpable masses or nodules in either right or left breasts. No palpable axillary supraclavicular or infraclavicular adenopathy no breast tenderness or nipple discharge. (exam performed in the presence of a chaperone)  LABORATORY DATA:  I have reviewed the data as listed   Chemistry      Component Value Date/Time   NA 138 06/27/2014 0814   K 3.9 06/27/2014 0814   CO2 32 (H) 06/27/2014 0814   BUN 13.5 06/27/2014 0814   CREATININE 0.8 06/27/2014 0814      Component Value Date/Time   CALCIUM 9.5 06/27/2014 0814   ALKPHOS 87 06/27/2014 0814   AST 23 06/27/2014 0814   ALT 20 06/27/2014 0814   BILITOT 0.52 06/27/2014 0814       Lab Results  Component Value Date   WBC 6.2 06/27/2014   HGB 13.7 06/27/2014   HCT 40.1 06/27/2014   MCV 86.7 06/27/2014   PLT 315 06/27/2014   NEUTROABS 4.1 06/27/2014    ASSESSMENT & PLAN:  Breast cancer of lower-outer quadrant of right female breast Right breast DCIS ER/PR positive status post lumpectomy and radiation. Currently on antiestrogen therapy with anastrozole Started 08/14/2013  Anastrozole toxicities: Slight arthritis but otherwise doing great  Breast cancer surveillance: 1.  Mammogram 04/14/2016: Benign Breast density C 2. Breast exam 07/06/2016 normal  Return to clinic in 1 year for follow-up.   I spent 15 minutes talking to the patient of which more than half was spent in counseling and coordination of care.  No orders of the defined types were placed in this encounter.  The patient has a good understanding of the overall plan. she agrees with it. she will call with any problems that may develop before the next visit here.   Rulon Eisenmenger, MD 07/06/16

## 2016-07-06 NOTE — Assessment & Plan Note (Signed)
Right breast DCIS ER/PR positive status post lumpectomy and radiation. Currently on antiestrogen therapy with anastrozole Started 08/14/2013  Anastrozole toxicities: Slight arthritis but otherwise doing great  Breast cancer surveillance: 1. Mammogram 04/14/2016: Benign Breast density C 2. Breast exam 07/06/2016 normal  Return to clinic in 1 year for follow-up.

## 2016-08-26 ENCOUNTER — Encounter (HOSPITAL_COMMUNITY): Payer: Self-pay

## 2016-08-26 ENCOUNTER — Emergency Department (HOSPITAL_COMMUNITY)
Admission: EM | Admit: 2016-08-26 | Discharge: 2016-08-27 | Disposition: A | Discharge status: Home / Self Care | Payer: Medicare Other | Attending: Emergency Medicine | Admitting: Emergency Medicine

## 2016-08-26 ENCOUNTER — Emergency Department (HOSPITAL_COMMUNITY): Payer: Medicare Other

## 2016-08-26 DIAGNOSIS — Z79899 Other long term (current) drug therapy: Secondary | ICD-10-CM | POA: Insufficient documentation

## 2016-08-26 DIAGNOSIS — K5792 Diverticulitis of intestine, part unspecified, without perforation or abscess without bleeding: Secondary | ICD-10-CM | POA: Insufficient documentation

## 2016-08-26 DIAGNOSIS — E785 Hyperlipidemia, unspecified: Secondary | ICD-10-CM | POA: Insufficient documentation

## 2016-08-26 DIAGNOSIS — R1032 Left lower quadrant pain: Secondary | ICD-10-CM | POA: Diagnosis present

## 2016-08-26 DIAGNOSIS — E039 Hypothyroidism, unspecified: Secondary | ICD-10-CM | POA: Insufficient documentation

## 2016-08-26 DIAGNOSIS — Z7982 Long term (current) use of aspirin: Secondary | ICD-10-CM | POA: Insufficient documentation

## 2016-08-26 DIAGNOSIS — Z7984 Long term (current) use of oral hypoglycemic drugs: Secondary | ICD-10-CM | POA: Diagnosis not present

## 2016-08-26 DIAGNOSIS — I10 Essential (primary) hypertension: Secondary | ICD-10-CM | POA: Diagnosis not present

## 2016-08-26 DIAGNOSIS — E119 Type 2 diabetes mellitus without complications: Secondary | ICD-10-CM | POA: Diagnosis not present

## 2016-08-26 HISTORY — DX: Diverticulosis of intestine, part unspecified, without perforation or abscess without bleeding: K57.90

## 2016-08-26 HISTORY — DX: Diverticulitis of intestine, part unspecified, without perforation or abscess without bleeding: K57.92

## 2016-08-26 LAB — COMPREHENSIVE METABOLIC PANEL
ALT: 23 U/L (ref 14–54)
AST: 26 U/L (ref 15–41)
Albumin: 4 g/dL (ref 3.5–5.0)
Alkaline Phosphatase: 78 U/L (ref 38–126)
Anion gap: 10 (ref 5–15)
BUN: 19 mg/dL (ref 6–20)
CO2: 29 mmol/L (ref 22–32)
Calcium: 9.8 mg/dL (ref 8.9–10.3)
Chloride: 99 mmol/L — ABNORMAL LOW (ref 101–111)
Creatinine, Ser: 1.38 mg/dL — ABNORMAL HIGH (ref 0.44–1.00)
GFR calc Af Amer: 41 mL/min — ABNORMAL LOW (ref >60)
GFR calc non Af Amer: 35 mL/min — ABNORMAL LOW (ref >60)
Glucose, Bld: 130 mg/dL — ABNORMAL HIGH (ref 65–99)
Potassium: 3.3 mmol/L — ABNORMAL LOW (ref 3.5–5.1)
Sodium: 138 mmol/L (ref 135–145)
Total Bilirubin: 0.6 mg/dL (ref 0.3–1.2)
Total Protein: 6.8 g/dL (ref 6.5–8.1)

## 2016-08-26 LAB — CBC
HCT: 39.7 % (ref 36.0–46.0)
Hemoglobin: 13.1 g/dL (ref 12.0–15.0)
MCH: 28.6 pg (ref 26.0–34.0)
MCHC: 33 g/dL (ref 30.0–36.0)
MCV: 86.7 fL (ref 78.0–100.0)
Platelets: 325 10*3/uL (ref 150–400)
RBC: 4.58 MIL/uL (ref 3.87–5.11)
RDW: 14.2 % (ref 11.5–15.5)
WBC: 9.7 10*3/uL (ref 4.0–10.5)

## 2016-08-26 LAB — URINALYSIS, ROUTINE W REFLEX MICROSCOPIC
Bilirubin Urine: NEGATIVE
Glucose, UA: NEGATIVE mg/dL
Hgb urine dipstick: NEGATIVE
Ketones, ur: NEGATIVE mg/dL
Nitrite: NEGATIVE
Protein, ur: NEGATIVE mg/dL
Specific Gravity, Urine: 1.021 (ref 1.005–1.030)
pH: 5 (ref 5.0–8.0)

## 2016-08-26 LAB — LIPASE, BLOOD: Lipase: 28 U/L (ref 11–51)

## 2016-08-26 MED ORDER — IOPAMIDOL (ISOVUE-300) INJECTION 61%
INTRAVENOUS | Status: AC
  Administered 2016-08-26: 100 mL via INTRAVENOUS
  Filled 2016-08-26: qty 100

## 2016-08-26 MED ORDER — SODIUM CHLORIDE 0.9 % IV BOLUS (SEPSIS)
1000.0000 mL | Freq: Once | INTRAVENOUS | Status: AC
Start: 2016-08-26 — End: 2016-08-27
  Administered 2016-08-26: 1000 mL via INTRAVENOUS

## 2016-08-26 NOTE — ED Provider Notes (Signed)
[facility] DEPT Provider Note   CSN: [id number] Arrival date & time: 08/26/16  1600     History   Chief Complaint Chief Complaint  Patient presents with  . Abdominal Pain    HPI Cristina Douglas is a 81 y.o. female.  HPI Patient with history of DM, diverticulosis, HLD, breast cancer, HTN who presents with left lower abdominal pain. Patient reports pain is been chronic, occurring intermittently for the past year or so. She has talked to her primary care physician about this pain, has been treating it with different probiotics and minerals. Today she reports pain became more severe and that she had trouble walking. Was associated with nausea. She denies any vomiting, fevers or chills, diarrhea. She has known history of diverticulosis, and possibly has history of diverticulitis, although cannot recall if she was treated with antibiotics. Prior colonoscopy significant for polyps. She has had some burning with urination.   Past Medical History:  Diagnosis Date  . Basal cell carcinoma   . Cataract   . Diabetes mellitus without complication ([place])   . Diverticulitis   . Diverticulosis   . Hypothyroidism   . Mitral valve insufficiency and aortic valve insufficiency    echo-2009-mild  . Other and unspecified hyperlipidemia   . Personal history of radiation therapy   . Unspecified essential hypertension     Patient Active Problem List   Diagnosis Date Noted  . Breast cancer of lower-outer quadrant of right female breast ([place]) 04/12/2013  . Palpitations 09/01/2010  . Orthostatic hypotension 09/01/2010  . HYPERLIPIDEMIA-MIXED 07/25/2008  . MITRAL REGURGITATION, 0 ([facility]) 07/25/2008  . Essential hypertension 07/25/2008    Past Surgical History:  Procedure Laterality Date  . APPENDECTOMY    . BREAST BIOPSY Right 04/10/2013  . BREAST LUMPECTOMY Right 05/09/2013  . BREAST LUMPECTOMY WITH RADIOACTIVE SEED LOCALIZATION Right 05/09/2013   Procedure: BREAST LUMPECTOMY WITH  RADIOACTIVE SEED LOCALIZATION;  Surgeon: Adin Hector, MD;  Location: [facility];  Service: General;  Laterality: Right;  . COLONOSCOPY    . EYE SURGERY     both cataracts  . TONSILLECTOMY    . TUBAL LIGATION      OB History    No data available       Home Medications    Prior to Admission medications   Medication Sig Start Date End Date Taking? Authorizing Provider  anastrozole (ARIMIDEX) 1 MG tablet Take 1 tablet (1 mg total) by mouth daily. 07/06/16  Yes Nicholas Lose, MD  aspirin 81 MG tablet Take 81 mg by mouth at bedtime.    Yes [provider]  atorvastatin (LIPITOR) 80 MG tablet Take 80 mg by mouth at bedtime.  08/20/10  Yes [provider]  Calcium Carbonate-Vitamin D (CALTRATE 600+D) 600-400 MG-UNIT per tablet Take 1 tablet by mouth 2 (two) times daily.    Yes [provider]  cholecalciferol (VITAMIN D) 1000 UNITS tablet Take 1,000 Units by mouth daily.     Yes [provider]  Coenzyme Q10 (CO Q 10) 100 MG CAPS Take 50 mg by mouth daily.    Yes [provider]  glucosamine-chondroitin 500-400 MG tablet Take 1 tablet by mouth 2 (two) times daily.    Yes [provider]  levothyroxine (SYNTHROID, LEVOTHROID) 88 MCG tablet Take 88 mcg by mouth daily.     Yes [provider]  losartan-hydrochlorothiazide (HYZAAR) 50-12.5 MG per tablet Take 1 tablet by mouth 2 (two) times daily.     Yes [provider]  MAGNESIUM OXIDE PO Take 1 tablet by mouth at bedtime.    Yes [provider]  metFORMIN (GLUCOPHAGE-XR) 500 MG 24 hr tablet Take 1 tablet (500 mg total) by mouth 2 (two) times daily. Pt advised to hold by Dr. Alona Bene- Endocrinologist 07/06/16  Yes Nicholas Lose, MD  metoprolol tartrate (LOPRESSOR) 25 MG tablet TAKE 1/2 TABLET BY MOUTH TWICE DAILY Patient taking differently: TAKE 12.5 mg TABLET BY MOUTH TWICE DAILY 03/17/16  Yes Herminio Commons, MD  Multiple Vitamin (MULTIVITAMIN)  tablet Take 1 tablet by mouth daily.     Yes [provider]  Probiotic Product (PROBIOTIC DAILY PO) Take 1 capsule by mouth daily. Ultra flora IB   Yes [provider]  TURMERIC PO Take 4 mg by mouth daily. Liquid form   Yes [provider]  ciprofloxacin (CIPRO) 500 MG tablet Take 1 tablet (500 mg total) by mouth every 12 (twelve) hours. 08/27/16 09/06/16  Arnetha Massy, MD  metroNIDAZOLE (FLAGYL) 500 MG tablet Take 1 tablet (500 mg total) by mouth 3 (three) times daily. 08/27/16 09/06/16  Arnetha Massy, MD  ondansetron (ZOFRAN) 4 MG tablet Take 1 tablet (4 mg total) by mouth every 8 (eight) hours as needed for nausea or vomiting. 08/27/16   Arnetha Massy, MD    Family History Family History  Problem Relation Age of Onset  . Lung cancer Paternal Aunt     Social History Social History  Substance Use Topics  . Smoking status: Never Smoker  . Smokeless tobacco: Never Used  . Alcohol use Yes     Comment: occas- 10/2/15Rarely drinks wine - AJ     Allergies   Sulfa antibiotics   Review of Systems Review of Systems  Constitutional: Negative for chills and fever.  HENT: Negative for ear pain and sore throat.   Eyes: Negative for pain and visual disturbance.  Respiratory: Negative for cough and shortness of breath.   Cardiovascular: Negative for chest pain and palpitations.  Gastrointestinal: Positive for abdominal pain and nausea. Negative for constipation, diarrhea and vomiting.  Genitourinary: Positive for dysuria. Negative for hematuria.  Musculoskeletal: Negative for arthralgias and back pain.  Skin: Negative for color change and rash.  Neurological: Negative for seizures and syncope.  All other systems reviewed and are negative.    Physical Exam Updated Vital Signs BP (!) 153/71   Pulse 71   Temp 98.1 F (36.7 C) (Oral)   Resp 16   Ht 5\' 3"  (1.6 m)   Wt 78.9 kg (174 lb)   SpO2 96%   BMI 30.82 kg/m   Physical Exam  Constitutional: She  is oriented to person, place, and time. She appears well-developed and well-nourished. No distress.  HENT:  Head: Normocephalic and atraumatic.  Eyes: Conjunctivae are normal.  Neck: Neck supple.  Cardiovascular: Normal rate and regular rhythm.   No murmur heard. Pulmonary/Chest: Effort normal and breath sounds normal. No respiratory distress.  Abdominal: Soft. She exhibits distension. There is tenderness (ttp in LLQ and epigastric area). There is no rebound and no guarding.  Musculoskeletal: She exhibits no edema.  Neurological: She is alert and oriented to person, place, and time. No cranial nerve deficit or sensory deficit. She exhibits normal muscle tone.  Skin: Skin is warm and dry.  Psychiatric: She has a normal mood and affect.  Nursing note and vitals reviewed.    ED Treatments / Results  Labs (all labs ordered are listed, but only abnormal results are displayed) Labs Reviewed  COMPREHENSIVE METABOLIC PANEL - Abnormal; Notable for the following:       Result Value   Potassium 3.3 (*)    Chloride 99 (*)    Glucose, Bld 130 (*)    Creatinine, Ser 1.38 (*)    GFR calc non Af Amer 35 (*)    GFR calc Af Amer 41 (*)    All other components within normal limits  URINALYSIS, ROUTINE W REFLEX MICROSCOPIC - Abnormal; Notable for the following:    APPearance HAZY (*)    Leukocytes, UA MODERATE (*)    Bacteria, UA RARE (*)    Squamous Epithelial / LPF 0-5 (*)    All other components within normal limits  LIPASE, BLOOD  CBC    EKG  EKG Interpretation None       Radiology Ct Abdomen Pelvis W Contrast  Result Date: 08/26/2016 CLINICAL DATA:  Suprapubic and left lower quadrant pain for 2 days. EXAM: CT ABDOMEN AND PELVIS WITH CONTRAST TECHNIQUE: Multidetector CT imaging of the abdomen and pelvis was performed using the standard protocol following bolus administration of intravenous contrast. CONTRAST:  160mL ISOVUE-300 IOPAMIDOL (ISOVUE-300) INJECTION 61% COMPARISON:  None.  FINDINGS: Lower chest: No consolidation or pleural fluid. Heart is upper normal in size. Small hiatal hernia. Hepatobiliary: No focal liver abnormality is seen. No gallstones, gallbladder wall thickening, or biliary dilatation. Pancreas: No ductal dilatation or inflammation. Spleen: Normal in size without focal abnormality. Adrenals/Urinary Tract: Normal adrenal glands. No hydronephrosis or perinephric edema. Early excreted contrast within both renal collecting systems. Homogeneous enhancement with symmetric excretion on delayed phase imaging. Urinary bladder is physiologically distended, no bladder wall thickening. Stomach/Bowel: Small hiatal hernia. Stomach is nondistended. No small bowel dilatation or inflammation. Post appendectomy. Moderate stool in the proximal colon. Scattered colonic diverticulae in the descending colon with advanced distal descending and sigmoid diverticulosis. Mild colonic wall thickening and faint pericolonic fat stranding in the mid sigmoid in the central pelvis suggests acute diverticulitis. No perforation or abscess. Vascular/Lymphatic: Mild aortic atherosclerosis. No aneurysm. No abdominal or pelvic adenopathy. Reproductive: Uterus and bilateral adnexa are unremarkable. Other: No free air, free fluid, or intra-abdominal fluid collection. Small fat containing umbilical hernia. Musculoskeletal: There are no acute or suspicious osseous abnormalities. Mild degenerative change in the spine. IMPRESSION: 1. Findings suspicious for mild acute diverticulitis in the mid sigmoid colon. No perforation or abscess. Relatively advanced colonic diverticulosis of the distal colon. 2.  Aortic Atherosclerosis (ICD10-I70.0). Electronically Signed   By: Jeb Levering M.D.   On: 08/26/2016 23:51    Procedures Procedures (including critical care time)  Medications Ordered in ED Medications  sodium chloride 0.9 % bolus 1,000 mL (1,000 mLs Intravenous New Bag/Given 08/26/16 2241)  iopamidol  (ISOVUE-300) 61 % injection (100 mLs Intravenous Contrast Given 08/26/16 2311)     Initial Impression / Assessment and Plan / ED Course  I have reviewed the triage vital signs and the nursing notes.  Pertinent labs & imaging results that were available during my care of the patient were reviewed by me and considered in my medical decision making (see chart for details).     Patient is a 81 y/o female with history of diverticulosis who presents with chronic abdominal pain. Patient arrived H[facility] in no acute distress, exam as above.  Labs significant for mild AKI - Cr elevated to 1.38 (last Cr from 2016 at 0.8).  No leukocytosis. Negative UA. CT abd/pelvis significant for acute mild diverticulitis of sigmoid colon.  Mild AKI on labs in  setting of decreased po intake. Given IV fluids. Instructed to continue plenty of fluids.   Discussed results with patient and her husband. She prefers outpatient trial of antibiotic therapy. I think this is reasonable given patient's benign abdominal exam, stable vitals, and ability to tolerate po. Discussed strict return precautions. Also discussed follow up abdominal exam with physician on Monday. Patient and her husband in agreement with plan at time of discharge.   Patient and plan of care discussed with Attending physician, Dr. Tomi Bamberger.    Final Clinical Impressions(s) / ED Diagnoses   Final diagnoses:  Diverticulitis    New Prescriptions New Prescriptions   CIPROFLOXACIN (CIPRO) 500 MG TABLET    Take 1 tablet (500 mg total) by mouth every 12 (twelve) hours.   METRONIDAZOLE (FLAGYL) 500 MG TABLET    Take 1 tablet (500 mg total) by mouth 3 (three) times daily.   ONDANSETRON (ZOFRAN) 4 MG TABLET    Take 1 tablet (4 mg total) by mouth every 8 (eight) hours as needed for nausea or vomiting.     Arnetha Massy, MD 08/27/16 1594    Dorie Rank, MD 08/29/16 [phone]

## 2016-08-26 NOTE — ED Triage Notes (Signed)
Pt endorses lower right and left abd discomfort x 1 week with nausea. Denies vomiting or diarrhea. Has hx of diverticulitis. Pt takes ultra floura IB.

## 2016-08-27 MED ORDER — ONDANSETRON HCL 4 MG PO TABS
4.0000 mg | ORAL_TABLET | Freq: Three times a day (TID) | ORAL | 0 refills | Status: DC | PRN
Start: 2016-08-27 — End: 2017-07-14

## 2016-08-27 MED ORDER — CIPROFLOXACIN HCL 500 MG PO TABS: 500.0000 mg | ORAL_TABLET | Freq: Two times a day (BID) | ORAL | 0 refills | Status: AC

## 2016-08-27 MED ORDER — METRONIDAZOLE 500 MG PO TABS: 500.0000 mg | ORAL_TABLET | Freq: Three times a day (TID) | ORAL | 0 refills | Status: AC

## 2016-08-27 NOTE — ED Notes (Signed)
Pt left at this time with all belongings. Refused wheelchair.

## 2016-08-27 NOTE — Discharge Instructions (Signed)
Your work up today was significant for CT findings of diverticulitis of your sigmoid colon. You are stable for outpatient treatment for you abdominal infection. Please return immediately to ED if you have any worsening abdominal pain, fever, or inability to keep fluids and antibiotics down. Take antibiotics as instructed.  Please follow up with one of your Physician's for abdominal recheck on Monday.

## 2016-09-02 ENCOUNTER — Other Ambulatory Visit: Payer: Self-pay | Admitting: Cardiovascular Disease

## 2016-09-22 ENCOUNTER — Ambulatory Visit (INDEPENDENT_AMBULATORY_CARE_PROVIDER_SITE_OTHER): Payer: Medicare Other | Admitting: Cardiovascular Disease

## 2016-09-22 ENCOUNTER — Encounter: Payer: Self-pay | Admitting: Cardiovascular Disease

## 2016-09-22 VITALS — BP 150/78 | HR 71 | Wt 176.0 lb

## 2016-09-22 DIAGNOSIS — I10 Essential (primary) hypertension: Secondary | ICD-10-CM | POA: Diagnosis not present

## 2016-09-22 DIAGNOSIS — R002 Palpitations: Secondary | ICD-10-CM

## 2016-09-22 DIAGNOSIS — I493 Ventricular premature depolarization: Secondary | ICD-10-CM

## 2016-09-22 DIAGNOSIS — E785 Hyperlipidemia, unspecified: Secondary | ICD-10-CM | POA: Diagnosis not present
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
## 2016-09-22 NOTE — Patient Instructions (Signed)

## 2016-10-15 ENCOUNTER — Other Ambulatory Visit: Payer: Self-pay | Admitting: Physical Medicine and Rehabilitation

## 2016-10-15 DIAGNOSIS — M5136 Other intervertebral disc degeneration, lumbar region: Secondary | ICD-10-CM

## 2016-11-01 ENCOUNTER — Ambulatory Visit
Admission: RE | Admit: 2016-11-01 | Discharge: 2016-11-01 | Disposition: A | Discharge status: Home / Self Care | Payer: Medicare Other | Source: Ambulatory Visit | Attending: Physical Medicine and Rehabilitation | Admitting: Physical Medicine and Rehabilitation

## 2016-11-01 DIAGNOSIS — M5136 Other intervertebral disc degeneration, lumbar region: Secondary | ICD-10-CM

## 2016-11-02 ENCOUNTER — Other Ambulatory Visit: Payer: Self-pay | Admitting: Physical Medicine and Rehabilitation

## 2016-11-02 DIAGNOSIS — N289 Disorder of kidney and ureter, unspecified: Secondary | ICD-10-CM

## 2016-11-14 ENCOUNTER — Ambulatory Visit
Admission: RE | Admit: 2016-11-14 | Discharge: 2016-11-14 | Disposition: A | Discharge status: Home / Self Care | Payer: Medicare Other | Source: Ambulatory Visit | Attending: Physical Medicine and Rehabilitation | Admitting: Physical Medicine and Rehabilitation

## 2016-11-14 DIAGNOSIS — N289 Disorder of kidney and ureter, unspecified: Secondary | ICD-10-CM

## 2016-11-14 MED ORDER — GADOBENATE DIMEGLUMINE 529 MG/ML IV SOLN
14.0000 mL | Freq: Once | INTRAVENOUS | Status: AC | PRN
  Administered 2016-11-14: 14 mL via INTRAVENOUS

## 2016-11-18 ENCOUNTER — Other Ambulatory Visit: Payer: Medicare Other

## 2016-11-29 ENCOUNTER — Telehealth: Payer: Self-pay

## 2016-11-29 NOTE — Telephone Encounter (Signed)
Made MD aware of new kidney lesion per neurology that they are monitoring.  Cyndia Bent RN

## 2016-12-31 ENCOUNTER — Other Ambulatory Visit: Payer: Self-pay | Admitting: Obstetrics and Gynecology

## 2016-12-31 DIAGNOSIS — Z853 Personal history of malignant neoplasm of breast: Secondary | ICD-10-CM

## 2017-01-20 ENCOUNTER — Other Ambulatory Visit: Payer: Self-pay | Admitting: Cardiovascular Disease

## 2017-03-24 ENCOUNTER — Encounter: Payer: Self-pay | Admitting: Hematology and Oncology

## 2017-04-06 ENCOUNTER — Other Ambulatory Visit: Payer: Self-pay | Admitting: Urology

## 2017-04-06 DIAGNOSIS — D49511 Neoplasm of unspecified behavior of right kidney: Secondary | ICD-10-CM

## 2017-04-07 ENCOUNTER — Telehealth: Payer: Self-pay

## 2017-04-07 NOTE — Telephone Encounter (Signed)
Returned pt call. Per Dr. Lindi Adie he has no objections to her PCP starting her on Fosamax. She also wanted to inform Dr. Lindi Adie of her MRI in November showing possible renal cell carcinoma. I made him aware. She is being followed by cardiology. No further questions at this time. Cyndia Bent RN

## 2017-04-21 ENCOUNTER — Ambulatory Visit
Admission: RE | Admit: 2017-04-21 | Discharge: 2017-04-21 | Disposition: A | Discharge status: Home / Self Care | Payer: Medicare Other | Source: Ambulatory Visit | Attending: Obstetrics and Gynecology | Admitting: Obstetrics and Gynecology

## 2017-04-21 DIAGNOSIS — Z853 Personal history of malignant neoplasm of breast: Secondary | ICD-10-CM

## 2017-04-21 HISTORY — DX: Malignant neoplasm of unspecified site of unspecified female breast: C50.919

## 2017-04-25 ENCOUNTER — Ambulatory Visit
Admission: RE | Admit: 2017-04-25 | Discharge: 2017-04-25 | Disposition: A | Discharge status: Home / Self Care | Payer: Medicare Other | Source: Ambulatory Visit | Attending: Urology | Admitting: Urology

## 2017-04-25 DIAGNOSIS — D49511 Neoplasm of unspecified behavior of right kidney: Secondary | ICD-10-CM

## 2017-04-25 MED ORDER — GADOBENATE DIMEGLUMINE 529 MG/ML IV SOLN
16.0000 mL | Freq: Once | INTRAVENOUS | Status: AC | PRN
  Administered 2017-04-25: 16 mL via INTRAVENOUS

## 2017-05-31 ENCOUNTER — Other Ambulatory Visit: Payer: Self-pay

## 2017-05-31 MED ORDER — ANASTROZOLE 1 MG PO TABS: 1.0000 mg | ORAL_TABLET | Freq: Every day | ORAL | 3 refills | Status: DC

## 2017-07-14 ENCOUNTER — Telehealth: Payer: Self-pay | Admitting: Hematology and Oncology

## 2017-07-14 ENCOUNTER — Inpatient Hospital Stay: Payer: Medicare Other | Attending: Hematology and Oncology | Admitting: Hematology and Oncology

## 2017-07-14 DIAGNOSIS — Z7984 Long term (current) use of oral hypoglycemic drugs: Secondary | ICD-10-CM

## 2017-07-14 DIAGNOSIS — N289 Disorder of kidney and ureter, unspecified: Secondary | ICD-10-CM

## 2017-07-14 DIAGNOSIS — Z79818 Long term (current) use of other agents affecting estrogen receptors and estrogen levels: Secondary | ICD-10-CM

## 2017-07-14 DIAGNOSIS — Z79899 Other long term (current) drug therapy: Secondary | ICD-10-CM | POA: Diagnosis not present

## 2017-07-14 DIAGNOSIS — Z170 Estrogen receptor positive status [ER+]: Secondary | ICD-10-CM

## 2017-07-14 DIAGNOSIS — Z923 Personal history of irradiation: Secondary | ICD-10-CM

## 2017-07-14 DIAGNOSIS — C50511 Malignant neoplasm of lower-outer quadrant of right female breast: Secondary | ICD-10-CM | POA: Diagnosis present

## 2017-07-14 DIAGNOSIS — Z7982 Long term (current) use of aspirin: Secondary | ICD-10-CM

## 2017-07-14 MED ORDER — ANASTROZOLE 1 MG PO TABS: 1.0000 mg | ORAL_TABLET | Freq: Every day | ORAL | 3 refills | Status: DC

## 2017-07-14 NOTE — Telephone Encounter (Signed)
Scheduled per 7/11 los.  Gave patient AVS and Calendar.

## 2017-07-14 NOTE — Assessment & Plan Note (Signed)
Right breast DCIS ER/PR positive status post lumpectomy and radiation. Currently on antiestrogen therapy with anastrozole Started 08/14/2013  Anastrozole toxicities: Slight arthritis but otherwise doing great  Breast cancer surveillance: 1. Mammogram  04/21/2017: Benign Breast density C 2. Breast exam 07/14/2017: normal  04/25/2017:Kidney lesion: MRI abdomen showed enhancing lesion right kidney is stable to minimally larger.  Atypical because of T2 hyperintensity remains suspicious for atypical renal cell cancer.  Hepatic steatosis  Return to clinic in 1 year for follow-up

## 2017-07-14 NOTE — Progress Notes (Signed)
Patient Care Team: Rory Percy, MD as PCP - General (Cardiology)  DIAGNOSIS:  Encounter Diagnosis  Name Primary?  . Malignant neoplasm of lower-outer quadrant of right breast of female, estrogen receptor positive ([place])     SUMMARY OF ONCOLOGIC HISTORY:   Breast cancer of lower-outer quadrant of right female breast ([place])   04/12/2013 Initial Diagnosis    Right stereotactic core needle biopsy: Breast cancer of lower-outer quadrant of right female breast: High grade DCIS with comedo necrosis ER/PR positive      04/13/2013 Breast MRI    Right breast 2.5 x 2.2 x 0.9 cm nodular enhancement      06/26/2013 - 08/02/2013 Radiation Therapy    Radiation to lumpectomy site by Dr. Sondra Come in [place]      08/14/2013 -  Anti-estrogen oral therapy    Arimidex 1 mg daily       CHIEF COMPLIANT: Follow-up on Arimidex therapy  INTERVAL HISTORY: Cristina Douglas is a 82 year old with above-mentioned history of right breast DCIS who underwent lumpectomy radiation and is currently on antiestrogen therapy with anastrozole.  She is tolerating it extremely well.  She does have some hot flashes at night but otherwise no other symptoms.  She does have tenderness in the left breast periodically.  Denies any lumps or nodules.  REVIEW OF SYSTEMS:   Constitutional: Denies fevers, chills or abnormal weight loss Eyes: Denies blurriness of vision Ears, nose, mouth, throat, and face: Denies mucositis or sore throat Respiratory: Denies cough, dyspnea or wheezes Cardiovascular: Denies palpitation, chest discomfort Gastrointestinal:  Denies nausea, heartburn or change in bowel habits Skin: Denies abnormal skin rashes Lymphatics: Denies new lymphadenopathy or easy bruising Neurological:Denies numbness, tingling or new weaknesses Behavioral/Psych: Mood is stable, no new changes  Extremities: No lower extremity edema Breast:  denies any pain or lumps or nodules in either breasts All other systems were reviewed  with the patient and are negative.  I have reviewed the past medical history, past surgical history, social history and family history with the patient and they are unchanged from previous note.  ALLERGIES:  is allergic to sulfa antibiotics.  MEDICATIONS:  Current Outpatient Medications  Medication Sig Dispense Refill  . anastrozole (ARIMIDEX) 1 MG tablet Take 1 tablet (1 mg total) by mouth daily. 90 tablet 3  . aspirin 81 MG tablet Take 81 mg by mouth at bedtime.     Marland Kitchen atorvastatin (LIPITOR) 80 MG tablet Take 80 mg by mouth at bedtime.     . Calcium Carbonate-Vitamin D (CALTRATE 600+D) 600-400 MG-UNIT per tablet Take 1 tablet by mouth 2 (two) times daily.     . cholecalciferol (VITAMIN D) 1000 UNITS tablet Take 1,000 Units by mouth daily.      . Coenzyme Q10 (CO Q 10) 100 MG CAPS Take 50 mg by mouth daily.     Marland Kitchen glucosamine-chondroitin 500-400 MG tablet Take 1 tablet by mouth 2 (two) times daily.     Marland Kitchen levothyroxine (SYNTHROID, LEVOTHROID) 88 MCG tablet Take 88 mcg by mouth daily.      Marland Kitchen losartan-hydrochlorothiazide (HYZAAR) 50-12.5 MG per tablet Take 1 tablet by mouth 2 (two) times daily.      Marland Kitchen MAGNESIUM OXIDE PO Take 1 tablet by mouth at bedtime.     . metFORMIN (GLUCOPHAGE-XR) 500 MG 24 hr tablet Take 1 tablet (500 mg total) by mouth 2 (two) times daily. Pt advised to hold by Dr. Alona Bene- Endocrinologist    . metoprolol tartrate (LOPRESSOR) 25 MG tablet TAKE 1/2  TABLET BY MOUTH TWICE DAILY 90 tablet 1  . Multiple Vitamin (MULTIVITAMIN) tablet Take 1 tablet by mouth daily.      . NON FORMULARY IB guard FD Guard    . Probiotic Product (PROBIOTIC DAILY PO) Take 1 capsule by mouth daily. Ultra flora IB    . TURMERIC PO Take 4 mg by mouth daily. Liquid form     No current facility-administered medications for this visit.     PHYSICAL EXAMINATION: ECOG PERFORMANCE STATUS: 1 - Symptomatic but completely ambulatory  Vitals:   07/14/17 1425  BP: 125/82  Pulse: 80  Resp: 17  Temp: 98.8  F (37.1 C)  SpO2: 97%   Filed Weights   07/14/17 1425  Weight: 181 lb 11.2 oz (82.4 kg)    GENERAL:alert, no distress and comfortable SKIN: skin color, texture, turgor are normal, no rashes or significant lesions EYES: normal, Conjunctiva are pink and non-injected, sclera clear OROPHARYNX:no exudate, no erythema and lips, buccal mucosa, and tongue normal  NECK: supple, thyroid normal size, non-tender, without nodularity LYMPH:  no palpable lymphadenopathy in the cervical, axillary or inguinal LUNGS: clear to auscultation and percussion with normal breathing effort HEART: regular rate & rhythm and no murmurs and no lower extremity edema ABDOMEN:abdomen soft, non-tender and normal bowel sounds MUSCULOSKELETAL:no cyanosis of digits and no clubbing  NEURO: alert & oriented x 3 with fluent speech, no focal motor/sensory deficits EXTREMITIES: No lower extremity edema BREAST: No palpable masses or nodules in either right or left breasts. No palpable axillary supraclavicular or infraclavicular adenopathy no breast tenderness or nipple discharge. (exam performed in the presence of a chaperone)  LABORATORY DATA:  I have reviewed the data as listed CMP Latest Ref Rng & Units 08/26/2016 06/27/2014 04/18/2013  Glucose 65 - 99 mg/dL 130(H) 118 108  BUN 6 - 20 mg/dL 19 13.5 13.7  Creatinine 0.44 - 1.00 mg/dL 1.38(H) 0.8 0.8  Sodium 135 - 145 mmol/L 138 138 138  Potassium 3.5 - 5.1 mmol/L 3.3(L) 3.9 3.6  Chloride 101 - 111 mmol/L 99(L) - -  CO2 22 - 32 mmol/L 29 32(H) 30(H)  Calcium 8.9 - 10.3 mg/dL 9.8 9.5 10.2  Total Protein 6.5 - 8.1 g/dL 6.8 6.7 7.2  Total Bilirubin 0.3 - 1.2 mg/dL 0.6 0.52 0.60  Alkaline Phos 38 - 126 U/L 78 87 60  AST 15 - 41 U/L 26 23 24   ALT 14 - 54 U/L 23 20 18     Lab Results  Component Value Date   WBC 9.7 08/26/2016   HGB 13.1 08/26/2016   HCT 39.7 08/26/2016   MCV 86.7 08/26/2016   PLT 325 08/26/2016   NEUTROABS 4.1 06/27/2014    ASSESSMENT & PLAN:    Breast cancer of lower-outer quadrant of right female breast Right breast DCIS ER/PR positive status post lumpectomy and radiation. Currently on antiestrogen therapy with anastrozole Started 08/14/2013  Anastrozole toxicities: Slight arthritis but otherwise doing great Prescription for anastrozole.  She will complete 5 years of therapy by August 2020. Breast cancer surveillance: 1. Mammogram  04/21/2017: Benign Breast density C 2. Breast exam 07/14/2017: normal  04/25/2017:Kidney lesion: MRI abdomen showed enhancing lesion right kidney is stable to minimally larger.  Atypical because of T2 hyperintensity remains suspicious for atypical renal cell cancer.  Hepatic steatosis Patient follows with Dr. Alinda Money  Return to clinic in 1 year for follow-up    No orders of the defined types were placed in this encounter.  The patient has a good understanding  of the overall plan. she agrees with it. she will call with any problems that may develop before the next visit here.   Harriette Ohara, MD 07/14/17

## 2017-08-24 ENCOUNTER — Other Ambulatory Visit: Payer: Self-pay | Admitting: Cardiovascular Disease

## 2017-09-23 ENCOUNTER — Ambulatory Visit: Payer: Medicare Other | Admitting: Cardiovascular Disease

## 2017-10-04 ENCOUNTER — Other Ambulatory Visit: Payer: Self-pay | Admitting: Urology

## 2017-10-04 DIAGNOSIS — D4101 Neoplasm of uncertain behavior of right kidney: Secondary | ICD-10-CM

## 2017-10-04 DIAGNOSIS — D49511 Neoplasm of unspecified behavior of right kidney: Secondary | ICD-10-CM

## 2017-10-12 ENCOUNTER — Encounter: Payer: Self-pay | Admitting: Cardiovascular Disease

## 2017-10-12 ENCOUNTER — Ambulatory Visit: Payer: Medicare Other | Admitting: Cardiovascular Disease

## 2017-10-12 VITALS — BP 128/64 | HR 55 | Ht 63.0 in | Wt 177.0 lb

## 2017-10-12 DIAGNOSIS — E785 Hyperlipidemia, unspecified: Secondary | ICD-10-CM

## 2017-10-12 DIAGNOSIS — I493 Ventricular premature depolarization: Secondary | ICD-10-CM

## 2017-10-12 DIAGNOSIS — R002 Palpitations: Secondary | ICD-10-CM | POA: Diagnosis not present

## 2017-10-12 DIAGNOSIS — I10 Essential (primary) hypertension: Secondary | ICD-10-CM | POA: Diagnosis not present

## 2017-10-12 NOTE — Patient Instructions (Signed)

## 2017-10-12 NOTE — Progress Notes (Signed)
SUBJECTIVE: Mrs. Peden presents for routine follow up. She hasa history of palpitations. Several years ago she wore a Holter monitor and was noted to have PVCs and bigeminy.  She was diagnosed with right breast DCIS in 2015, and received radiation and hormone therapy and underwent a lumpectomy. She has a prior h/o colon cancer and follows with Dr. Collene Mares in [place] (GI).  ECG performed in the office today which I ordered and personally interpreted demonstrates sinus bradycardia, 59 bpm, with nonspecific T wave abnormalities and borderline anteroseptal Q waves.  She is doing well overall and denies chest pain and shortness of breath.  She rarely has palpitations.  She denies leg swelling, orthopnea, and paroxysmal nocturnal dyspnea.  She underwent an abdominal MRI in April 2019 which showed an enhancing lesion in the right kidney which was stable to minimally larger.  She has a follow-up MRI of her abdomen on 10/26/2017.   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 She and her husband [name], who lived in [place] in the past) enjoy reading quite a bit. She is a Writer of [employer] She is close friends with [name], who is also my patient. Son in [place] (runs [employer]), daughter [place]") in [place], and daughter Almyra Free) in [place].  Review of Systems: As per "subjective", otherwise negative.  Allergies  Allergen Reactions  . Sulfa Antibiotics     Current Outpatient Medications  Medication Sig Dispense Refill  . anastrozole (ARIMIDEX) 1 MG tablet Take 1 tablet (1 mg total) by mouth daily. 90 tablet 3  . aspirin 81 MG tablet Take 81 mg by mouth at bedtime.     Marland Kitchen atorvastatin (LIPITOR) 80 MG tablet Take 80 mg by mouth at bedtime.     . Calcium Carbonate-Vitamin D (CALTRATE 600+D) 600-400 MG-UNIT per tablet Take 1 tablet by mouth 2 (two) times daily.     .  cholecalciferol (VITAMIN D) 1000 UNITS tablet Take 1,000 Units by mouth daily.      . Coenzyme Q10 (CO Q 10) 100 MG CAPS Take 50 mg by mouth daily.     . fluorouracil (EFUDEX) 5 % cream Apply topically 2 (two) times daily.    Marland Kitchen glucosamine-chondroitin 500-400 MG tablet Take 1 tablet by mouth 2 (two) times daily.     Marland Kitchen levothyroxine (SYNTHROID, LEVOTHROID) 88 MCG tablet Take 88 mcg by mouth daily.      Marland Kitchen losartan-hydrochlorothiazide (HYZAAR) 50-12.5 MG per tablet Take 1 tablet by mouth 2 (two) times daily.      Marland Kitchen MAGNESIUM OXIDE PO Take 1 tablet by mouth at bedtime.     . metFORMIN (GLUCOPHAGE) 500 MG tablet Take 500 mg by mouth. Take 1 tablet in the morning and 2 in evening DR BALIN    . metoprolol tartrate (LOPRESSOR) 25 MG tablet Take 25 mg by mouth daily.    . Misc Natural Products (TURMERIC CURCUMIN) CAPS Take by mouth.    . Multiple Vitamin (MULTIVITAMIN) tablet Take 1 tablet by mouth daily.      . NON FORMULARY IB guard FD Guard    . Peppermint Oil (IBGARD PO) Take by mouth daily.     No current facility-administered medications for this visit.     Past Medical History:  Diagnosis Date  . Basal cell carcinoma   . Breast cancer ([place])   . Cataract   . Diabetes mellitus without complication (  [place])   . Diverticulitis   . Diverticulosis   . Hypothyroidism   . Mitral valve insufficiency and aortic valve insufficiency    echo-2009-mild  . Other and unspecified hyperlipidemia   . Personal history of radiation therapy   . Unspecified essential hypertension     Past Surgical History:  Procedure Laterality Date  . APPENDECTOMY    . BREAST BIOPSY Right 04/10/2013  . BREAST LUMPECTOMY Right 05/09/2013  . BREAST LUMPECTOMY WITH RADIOACTIVE SEED LOCALIZATION Right 05/09/2013   Procedure: BREAST LUMPECTOMY WITH RADIOACTIVE SEED LOCALIZATION;  Surgeon: Adin Hector, MD;  Location: [facility];  Service: General;  Laterality: Right;  . COLONOSCOPY    . EYE SURGERY      both cataracts  . TONSILLECTOMY    . TUBAL LIGATION      Social History   Socioeconomic History  . Marital status: Married    Spouse name: [name]  . Number of children: Not on file  . Years of education: Not on file  . Highest education level: Not on file  Occupational History  . Occupation: RETIRED  Social Needs  . Financial resource strain: Not on file  . Food insecurity:    Worry: Not on file    Inability: Not on file  . Transportation needs:    Medical: Not on file    Non-medical: Not on file  Tobacco Use  . Smoking status: Never Smoker  . Smokeless tobacco: Never Used  Substance and Sexual Activity  . Alcohol use: Yes    Comment: occas- 10/2/15Rarely drinks wine - AJ  . Drug use: No  . Sexual activity: Not on file  Lifestyle  . Physical activity:    Days per week: Not on file    Minutes per session: Not on file  . Stress: Not on file  Relationships  . Social connections:    Talks on phone: Not on file    Gets together: Not on file    Attends religious service: Not on file    Active member of club or organization: Not on file    Attends meetings of clubs or organizations: Not on file    Relationship status: Not on file  . Intimate partner violence:    Fear of current or ex partner: Not on file    Emotionally abused: Not on file    Physically abused: Not on file    Forced sexual activity: Not on file  Other Topics Concern  . Not on file  Social History Narrative  . Not on file     Vitals:   10/12/17 1247  BP: 128/64  Pulse: (!) 55  SpO2: 97%  Weight: 177 lb (80.3 kg)  Height: 5\' 3"  (1.6 m)    Wt Readings from Last 3 Encounters:  10/12/17 177 lb (80.3 kg)  07/14/17 181 lb 11.2 oz (82.4 kg)  09/22/16 176 lb (79.8 kg)     PHYSICAL EXAM General: NAD HEENT: Normal. Neck: No JVD, no thyromegaly. Lungs: Clear to auscultation bilaterally with normal respiratory effort. CV: Regular rate and rhythm, normal S1/S2, no S3/S4, no murmur. No pretibial  or periankle edema.  No carotid bruit.   Abdomen: Soft, nontender, no distention.  Neurologic: Alert and oriented.  Psych: Normal affect. Skin: Normal. Musculoskeletal: No gross deformities.    ECG: Reviewed above under Subjective   Labs: Lab Results  Component Value Date/Time   K 3.3 (L) 08/26/2016 04:57 PM   K 3.9 06/27/2014 08:14 AM   BUN  19 08/26/2016 04:57 PM   BUN 13.5 06/27/2014 08:14 AM   CREATININE 1.38 (H) 08/26/2016 04:57 PM   CREATININE 0.8 06/27/2014 08:14 AM   ALT 23 08/26/2016 04:57 PM   ALT 20 06/27/2014 08:14 AM   HGB 13.1 08/26/2016 04:57 PM   HGB 13.7 06/27/2014 08:14 AM     Lipids: No results found for: LDLCALC, LDLDIRECT, CHOL, TRIG, HDL     ASSESSMENT AND PLAN:  1. Palpitations/PVC's: Symptomatically stable. I previously explained to the patient that automated monitors are notoriously inaccurate for detecting PVCs and that prior reported "low heart rates" were likely normal.  She switched taking her Lopressor to 25 mg every morning and is content taking it this way.  No changes to therapy.   2. Essential HTN:  Blood pressure is normal.  No changes to therapy.  3. Hyperlipidemia: On Lipitor. Monitored by PCP.   Disposition: Follow up 1 year   Kate Sable, M.D., F.A.C.C.

## 2017-10-26 ENCOUNTER — Ambulatory Visit
Admission: RE | Admit: 2017-10-26 | Discharge: 2017-10-26 | Disposition: A | Discharge status: Home / Self Care | Payer: Medicare Other | Source: Ambulatory Visit | Attending: Urology | Admitting: Urology

## 2017-10-26 DIAGNOSIS — D4101 Neoplasm of uncertain behavior of right kidney: Secondary | ICD-10-CM

## 2017-10-26 DIAGNOSIS — D49511 Neoplasm of unspecified behavior of right kidney: Secondary | ICD-10-CM

## 2017-10-26 MED ORDER — GADOBENATE DIMEGLUMINE 529 MG/ML IV SOLN
15.0000 mL | Freq: Once | INTRAVENOUS | Status: AC | PRN
  Administered 2017-10-26: 15 mL via INTRAVENOUS

## 2017-11-02 ENCOUNTER — Other Ambulatory Visit: Payer: Self-pay | Admitting: Urology

## 2017-11-02 ENCOUNTER — Ambulatory Visit (HOSPITAL_COMMUNITY)
Admission: RE | Admit: 2017-11-02 | Discharge: 2017-11-02 | Disposition: A | Discharge status: Home / Self Care | Payer: Medicare Other | Source: Ambulatory Visit | Attending: Urology | Admitting: Urology

## 2017-11-02 DIAGNOSIS — C641 Malignant neoplasm of right kidney, except renal pelvis: Secondary | ICD-10-CM

## 2017-11-02 DIAGNOSIS — D49511 Neoplasm of unspecified behavior of right kidney: Secondary | ICD-10-CM | POA: Diagnosis present

## 2017-11-23 ENCOUNTER — Other Ambulatory Visit: Payer: Self-pay | Admitting: Cardiovascular Disease

## 2018-02-07 ENCOUNTER — Other Ambulatory Visit: Payer: Self-pay | Admitting: Obstetrics and Gynecology

## 2018-02-07 DIAGNOSIS — Z853 Personal history of malignant neoplasm of breast: Secondary | ICD-10-CM

## 2018-03-21 ENCOUNTER — Other Ambulatory Visit: Payer: Self-pay | Admitting: Cardiovascular Disease

## 2018-03-21 MED ORDER — METOPROLOL TARTRATE 25 MG PO TABS: 25.0000 mg | ORAL_TABLET | Freq: Every day | ORAL | 1 refills | Status: DC

## 2018-03-21 NOTE — Telephone Encounter (Signed)
° ° °  1. Which medications need to be refilled? (please list name of each medication and dose if known) metoprolol tartrate (LOPRESSOR) 25 MG    2. Which pharmacy/location (including street and city if local pharmacy) is medication to be sent to?   [facility]   3. Do they need a 30 day or 90 day supply? [place]

## 2018-06-22 ENCOUNTER — Other Ambulatory Visit: Payer: Self-pay

## 2018-06-22 ENCOUNTER — Ambulatory Visit
Admission: RE | Admit: 2018-06-22 | Discharge: 2018-06-22 | Disposition: A | Discharge status: Home / Self Care | Payer: Medicare Other | Source: Ambulatory Visit | Attending: Obstetrics and Gynecology | Admitting: Obstetrics and Gynecology

## 2018-06-22 DIAGNOSIS — Z853 Personal history of malignant neoplasm of breast: Secondary | ICD-10-CM

## 2018-07-28 NOTE — Assessment & Plan Note (Signed)
Right breast DCIS ER/PR positive status post lumpectomy and radiation. Currently on antiestrogen therapy with anastrozole Started 08/14/2013  Anastrozole toxicities: Slight arthritis but otherwise doing great She will complete 5 years of therapy by August 2020. This will complete her treatment.  Breast cancer surveillance: 1. Mammogram  06/22/2018:Benign Breast density C 2. Breast exam 07/14/2017:normal  04/25/2017:Kidney lesion: MRI abdomen showed enhancing lesion right kidney is stable to minimally larger.  Atypical because of T2 hyperintensity remains suspicious for atypical renal cell cancer.  Hepatic steatosis Patient follows with Dr. Alinda Money  Return to clinic in 1 year for follow-up for surveillance.

## 2018-08-02 NOTE — Progress Notes (Signed)
Patient Care Team: Rory Percy, MD as PCP - General (Cardiology) Herminio Commons, MD as PCP - Cardiology (Cardiology)  DIAGNOSIS:    ICD-10-CM   1. Malignant neoplasm of lower-outer quadrant of right breast of female, estrogen receptor positive ([place])  C50.511    Z17.0     SUMMARY OF ONCOLOGIC HISTORY: Oncology History  Breast cancer of lower-outer quadrant of right female breast ([place])  04/12/2013 Initial Diagnosis   Right stereotactic core needle biopsy: Breast cancer of lower-outer quadrant of right female breast: High grade DCIS with comedo necrosis ER/PR positive   04/13/2013 Breast MRI   Right breast 2.5 x 2.2 x 0.9 cm nodular enhancement   06/26/2013 - 08/02/2013 Radiation Therapy   Radiation to lumpectomy site by Dr. Sondra Come in [place]   08/14/2013 -  Anti-estrogen oral therapy   Arimidex 1 mg daily     CHIEF COMPLIANT: Follow-up of right breast DCIS on Arimidex therapy  INTERVAL HISTORY: Cristina Douglas is a 83 y.o. with above-mentioned history of right breast DCIS who underwent a lumpectomy, radiation, and is currently on antiestrogen therapy with anastrozole. I last saw her a year ago. Mammogram on 06/22/18 showed no evidence of malignancy bilaterally. She presents to the clinic today for annual follow-up.   REVIEW OF SYSTEMS:   Constitutional: Denies fevers, chills or abnormal weight loss Eyes: Denies blurriness of vision Ears, nose, mouth, throat, and face: Denies mucositis or sore throat Respiratory: Denies cough, dyspnea or wheezes Cardiovascular: Denies palpitation, chest discomfort Gastrointestinal: Denies nausea, heartburn or change in bowel habits Skin: Denies abnormal skin rashes Lymphatics: Denies new lymphadenopathy or easy bruising Neurological: Denies numbness, tingling or new weaknesses Behavioral/Psych: Mood is stable, no new changes  Extremities: No lower extremity edema Breast: denies any pain or lumps or nodules in either breasts All other  systems were reviewed with the patient and are negative.  I have reviewed the past medical history, past surgical history, social history and family history with the patient and they are unchanged from previous note.  ALLERGIES:  is allergic to sulfa antibiotics.  MEDICATIONS:  Current Outpatient Medications  Medication Sig Dispense Refill  . anastrozole (ARIMIDEX) 1 MG tablet Take 1 tablet (1 mg total) by mouth daily. 90 tablet 3  . aspirin 81 MG tablet Take 81 mg by mouth at bedtime.     Marland Kitchen atorvastatin (LIPITOR) 80 MG tablet Take 80 mg by mouth at bedtime.     . Calcium Carbonate-Vitamin D (CALTRATE 600+D) 600-400 MG-UNIT per tablet Take 1 tablet by mouth 2 (two) times daily.     . cholecalciferol (VITAMIN D) 1000 UNITS tablet Take 1,000 Units by mouth daily.      . Coenzyme Q10 (CO Q 10) 100 MG CAPS Take 50 mg by mouth daily.     . fluorouracil (EFUDEX) 5 % cream Apply topically 2 (two) times daily.    Marland Kitchen glucosamine-chondroitin 500-400 MG tablet Take 1 tablet by mouth 2 (two) times daily.     Marland Kitchen levothyroxine (SYNTHROID, LEVOTHROID) 88 MCG tablet Take 88 mcg by mouth daily.      Marland Kitchen losartan-hydrochlorothiazide (HYZAAR) 50-12.5 MG per tablet Take 1 tablet by mouth 2 (two) times daily.      Marland Kitchen MAGNESIUM OXIDE PO Take 1 tablet by mouth at bedtime.     . metFORMIN (GLUCOPHAGE) 500 MG tablet Take 500 mg by mouth. Take 1 tablet in the morning and 2 in evening DR BALIN    . metoprolol tartrate (LOPRESSOR) 25 MG  tablet Take 1 tablet (25 mg total) by mouth daily. 90 tablet 1  . Misc Natural Products (TURMERIC CURCUMIN) CAPS Take by mouth.    . Multiple Vitamin (MULTIVITAMIN) tablet Take 1 tablet by mouth daily.      . NON FORMULARY IB guard FD Guard    . Peppermint Oil (IBGARD PO) Take by mouth daily.     No current facility-administered medications for this visit.     PHYSICAL EXAMINATION: ECOG PERFORMANCE STATUS: 1 - Symptomatic but completely ambulatory  There were no vitals filed for this  visit. There were no vitals filed for this visit.  GENERAL: alert, no distress and comfortable SKIN: skin color, texture, turgor are normal, no rashes or significant lesions EYES: normal, Conjunctiva are pink and non-injected, sclera clear OROPHARYNX: no exudate, no erythema and lips, buccal mucosa, and tongue normal  NECK: supple, thyroid normal size, non-tender, without nodularity LYMPH: no palpable lymphadenopathy in the cervical, axillary or inguinal LUNGS: clear to auscultation and percussion with normal breathing effort HEART: regular rate & rhythm and no murmurs and no lower extremity edema ABDOMEN: abdomen soft, non-tender and normal bowel sounds MUSCULOSKELETAL: no cyanosis of digits and no clubbing  NEURO: alert & oriented x 3 with fluent speech, no focal motor/sensory deficits EXTREMITIES: No lower extremity edema BREAST: No palpable masses or nodules in either right or left breasts. No palpable axillary supraclavicular or infraclavicular adenopathy no breast tenderness or nipple discharge. (exam performed in the presence of a chaperone)  LABORATORY DATA:  I have reviewed the data as listed CMP Latest Ref Rng & Units 08/26/2016 06/27/2014 04/18/2013  Glucose 65 - 99 mg/dL 130(H) 118 108  BUN 6 - 20 mg/dL 19 13.5 13.7  Creatinine 0.44 - 1.00 mg/dL 1.38(H) 0.8 0.8  Sodium 135 - 145 mmol/L 138 138 138  Potassium 3.5 - 5.1 mmol/L 3.3(L) 3.9 3.6  Chloride 101 - 111 mmol/L 99(L) - -  CO2 22 - 32 mmol/L 29 32(H) 30(H)  Calcium 8.9 - 10.3 mg/dL 9.8 9.5 10.2  Total Protein 6.5 - 8.1 g/dL 6.8 6.7 7.2  Total Bilirubin 0.3 - 1.2 mg/dL 0.6 0.52 0.60  Alkaline Phos 38 - 126 U/L 78 87 60  AST 15 - 41 U/L 26 23 24   ALT 14 - 54 U/L 23 20 18     Lab Results  Component Value Date   WBC 9.7 08/26/2016   HGB 13.1 08/26/2016   HCT 39.7 08/26/2016   MCV 86.7 08/26/2016   PLT 325 08/26/2016   NEUTROABS 4.1 06/27/2014    ASSESSMENT & PLAN:  Breast cancer of lower-outer quadrant of right  female breast Right breast DCIS ER/PR positive status post lumpectomy and radiation. Currently on antiestrogen therapy with anastrozole Started 08/14/2013  Anastrozole toxicities: Slight arthritis but otherwise doing great She completed 5 years of therapy August 2020. This will complete her treatment.  Breast cancer surveillance: 1. Mammogram  06/22/2018:Benign Breast density C 2. Breast exam 07/14/2017:normal  04/25/2017:Kidney lesion: MRI abdomen showed enhancing lesion right kidney is stable to minimally larger.  Atypical because of T2 hyperintensity remains suspicious for atypical renal cell cancer.  Hepatic steatosis Patient follows with Dr. Alinda Money  Return to clinic on as-needed basis    No orders of the defined types were placed in this encounter.  The patient has a good understanding of the overall plan. she agrees with it. she will call with any problems that may develop before the next visit here.  Nicholas Lose, MD 08/03/2018  I,  Molly Dorshimer am acting as scribe for Dr. Nicholas Lose.  I have reviewed the above documentation for accuracy and completeness, and I agree with the above.

## 2018-08-03 ENCOUNTER — Other Ambulatory Visit: Payer: Self-pay

## 2018-08-03 ENCOUNTER — Inpatient Hospital Stay: Payer: Medicare Other | Attending: Hematology and Oncology | Admitting: Hematology and Oncology

## 2018-08-03 DIAGNOSIS — Z923 Personal history of irradiation: Secondary | ICD-10-CM

## 2018-08-03 DIAGNOSIS — N289 Disorder of kidney and ureter, unspecified: Secondary | ICD-10-CM | POA: Diagnosis not present

## 2018-08-03 DIAGNOSIS — K760 Fatty (change of) liver, not elsewhere classified: Secondary | ICD-10-CM | POA: Diagnosis not present

## 2018-08-03 DIAGNOSIS — Z79899 Other long term (current) drug therapy: Secondary | ICD-10-CM | POA: Insufficient documentation

## 2018-08-03 DIAGNOSIS — Z79811 Long term (current) use of aromatase inhibitors: Secondary | ICD-10-CM

## 2018-08-03 DIAGNOSIS — Z170 Estrogen receptor positive status [ER+]: Secondary | ICD-10-CM | POA: Diagnosis not present

## 2018-08-03 DIAGNOSIS — C50511 Malignant neoplasm of lower-outer quadrant of right female breast: Secondary | ICD-10-CM | POA: Diagnosis not present

## 2018-08-03 DIAGNOSIS — Z7984 Long term (current) use of oral hypoglycemic drugs: Secondary | ICD-10-CM | POA: Diagnosis not present

## 2018-08-03 DIAGNOSIS — Z7982 Long term (current) use of aspirin: Secondary | ICD-10-CM | POA: Diagnosis not present

## 2018-08-03 MED ORDER — METFORMIN HCL 500 MG PO TABS: 1000.0000 mg | ORAL_TABLET | Freq: Two times a day (BID) | ORAL | Status: DC

## 2018-08-24 ENCOUNTER — Other Ambulatory Visit: Payer: Self-pay | Admitting: Urology

## 2018-08-24 DIAGNOSIS — D49511 Neoplasm of unspecified behavior of right kidney: Secondary | ICD-10-CM

## 2018-08-30 ENCOUNTER — Other Ambulatory Visit: Payer: Self-pay | Admitting: Cardiovascular Disease

## 2018-10-17 ENCOUNTER — Ambulatory Visit: Payer: Medicare Other | Admitting: Cardiovascular Disease

## 2018-10-17 ENCOUNTER — Other Ambulatory Visit: Payer: Self-pay

## 2018-10-17 ENCOUNTER — Encounter: Payer: Self-pay | Admitting: Cardiovascular Disease

## 2018-10-17 VITALS — BP 111/70 | HR 90 | Ht 63.0 in | Wt 173.8 lb

## 2018-10-17 DIAGNOSIS — I10 Essential (primary) hypertension: Secondary | ICD-10-CM

## 2018-10-17 NOTE — Progress Notes (Signed)
SUBJECTIVE: Cristina Douglas presents for routine follow up. She hasa history of palpitations. Several years ago she wore a Holter monitor and was noted to have PVCs and bigeminy.  She was diagnosed with right breast DCIS in 2015, and received radiation and hormone therapy and underwent a lumpectomy. She has a prior h/o colon cancer and follows with Dr. Collene Mares in [place] (GI).  ECG performed today which I personally reviewed demonstrates sinus rhythm with mild nonspecific ST segment abnormalities.  She denies chest pain and shortness of breath.  She had a few palpitations about 2 weeks ago but they have not returned since.  She denies leg swelling, orthopnea, and paroxysmal nocturnal dyspnea.  She has visited with [name] once as she is staying at [facility].  She calls her on a weekly basis.   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 She and her husband [name], who lived in [place] in the past) enjoy reading quite a bit. She is a Writer of [employer] She is close friends with [name], who is also my patient. Son in [place] (runs [employer]), daughter [place] in [place], and daughter Almyra Free) in [place].  Review of Systems: As per "subjective", otherwise negative.  Allergies  Allergen Reactions  . Sulfa Antibiotics     Current Outpatient Medications  Medication Sig Dispense Refill  . aspirin 81 MG tablet Take 81 mg by mouth at bedtime.     Marland Kitchen atorvastatin (LIPITOR) 80 MG tablet Take 80 mg by mouth at bedtime.     . Calcium Carbonate-Vitamin D (CALTRATE 600+D) 600-400 MG-UNIT per tablet Take 1 tablet by mouth 2 (two) times daily.     . cholecalciferol (VITAMIN D) 1000 UNITS tablet Take 1,000 Units by mouth daily.      . Coenzyme Q10 (CO Q 10) 100 MG CAPS Take 50 mg by mouth daily.     Marland Kitchen glucosamine-chondroitin 500-400 MG tablet Take 1 tablet by mouth 2  (two) times daily.     Marland Kitchen levothyroxine (SYNTHROID, LEVOTHROID) 88 MCG tablet Take 88 mcg by mouth daily.      Marland Kitchen losartan-hydrochlorothiazide (HYZAAR) 50-12.5 MG per tablet Take 1 tablet by mouth 2 (two) times daily.      Marland Kitchen MAGNESIUM OXIDE PO Take 1 tablet by mouth at bedtime.     . metFORMIN (GLUCOPHAGE) 500 MG tablet Take 2 tablets (1,000 mg total) by mouth 2 (two) times daily with a meal. Take 1 tablet in the morning and 2 in evening DR Soyla Murphy (Patient taking differently: Take 1,000 mg by mouth 2 (two) times daily with a meal. )    . metoprolol tartrate (LOPRESSOR) 25 MG tablet TAKE 1 TABLET BY MOUTH EVERY DAY 90 tablet 1  . Misc Natural Products (TURMERIC CURCUMIN) CAPS Take 1 capsule by mouth daily.     . Multiple Vitamin (MULTIVITAMIN) tablet Take 1 tablet by mouth daily.       No current facility-administered medications for this visit.     Past Medical History:  Diagnosis Date  . Basal cell carcinoma   . Breast cancer ([place])   . Cataract   . Diabetes mellitus without complication ([place])   . Diverticulitis   . Diverticulosis   . Hypothyroidism   . Mitral valve insufficiency and aortic valve insufficiency    echo-2009-mild  . Other and unspecified hyperlipidemia   . Personal history of radiation therapy   .  Unspecified essential hypertension     Past Surgical History:  Procedure Laterality Date  . APPENDECTOMY    . BREAST BIOPSY Right 04/10/2013  . BREAST LUMPECTOMY Right 05/09/2013  . BREAST LUMPECTOMY WITH RADIOACTIVE SEED LOCALIZATION Right 05/09/2013   Procedure: BREAST LUMPECTOMY WITH RADIOACTIVE SEED LOCALIZATION;  Surgeon: Adin Hector, MD;  Location: [facility];  Service: General;  Laterality: Right;  . COLONOSCOPY    . EYE SURGERY     both cataracts  . TONSILLECTOMY    . TUBAL LIGATION      Social History   Socioeconomic History  . Marital status: Married    Spouse name: [name]  . Number of children: Not on file  . Years of education: Not  on file  . Highest education level: Not on file  Occupational History  . Occupation: RETIRED  Social Needs  . Financial resource strain: Not on file  . Food insecurity    Worry: Not on file    Inability: Not on file  . Transportation needs    Medical: Not on file    Non-medical: Not on file  Tobacco Use  . Smoking status: Never Smoker  . Smokeless tobacco: Never Used  Substance and Sexual Activity  . Alcohol use: Yes    Comment: occas- 10/2/15Rarely drinks wine - AJ  . Drug use: No  . Sexual activity: Not on file  Lifestyle  . Physical activity    Days per week: Not on file    Minutes per session: Not on file  . Stress: Not on file  Relationships  . Social Herbalist on phone: Not on file    Gets together: Not on file    Attends religious service: Not on file    Active member of club or organization: Not on file    Attends meetings of clubs or organizations: Not on file    Relationship status: Not on file  . Intimate partner violence    Fear of current or ex partner: Not on file    Emotionally abused: Not on file    Physically abused: Not on file    Forced sexual activity: Not on file  Other Topics Concern  . Not on file  Social History Narrative  . Not on file     Vitals:   10/17/18 1550  BP: 111/70  Pulse: 90  SpO2: 96%  Weight: 173 lb 12.8 oz (78.8 kg)  Height: 5\' 3"  (1.6 m)    Wt Readings from Last 3 Encounters:  10/17/18 173 lb 12.8 oz (78.8 kg)  08/03/18 175 lb 6.4 oz (79.6 kg)  10/12/17 177 lb (80.3 kg)     PHYSICAL EXAM General: NAD HEENT: Normal. Neck: No JVD, no thyromegaly. Lungs: Clear to auscultation bilaterally with normal respiratory effort. CV: Regular rate and rhythm, normal S1/S2, no S3/S4, no murmur. No pretibial or periankle edema.  No carotid bruit.   Abdomen: Soft, nontender, no distention.  Neurologic: Alert and oriented.  Psych: Normal affect. Skin: Normal. Musculoskeletal: No gross deformities.      Labs:  Lab Results  Component Value Date/Time   K 3.3 (L) 08/26/2016 04:57 PM   K 3.9 06/27/2014 08:14 AM   BUN 19 08/26/2016 04:57 PM   BUN 13.5 06/27/2014 08:14 AM   CREATININE 1.38 (H) 08/26/2016 04:57 PM   CREATININE 0.8 06/27/2014 08:14 AM   ALT 23 08/26/2016 04:57 PM   ALT 20 06/27/2014 08:14 AM   HGB 13.1 08/26/2016 04:57  PM   HGB 13.7 06/27/2014 08:14 AM     Lipids: No results found for: LDLCALC, LDLDIRECT, CHOL, TRIG, HDL     ASSESSMENT AND PLAN: 1. Palpitations/PVC's: Symptomatically stable. I previously explained to the patient that automated monitors are notoriously inaccurate for detecting PVCs and that prior reported "low heart rates" were likely normal.  She switched taking her Lopressor to 25 mg every morning and is content taking it this way.  No changes to therapy.   2. Essential HTN: Blood pressure is normal.  No changes to therapy.  3. Hyperlipidemia: On Lipitor. Monitored by PCP.    Disposition: Follow up 1 YR   Kate Sable, M.D., F.A.C.C.

## 2018-10-17 NOTE — Patient Instructions (Signed)

## 2018-10-18 ENCOUNTER — Ambulatory Visit
Admission: RE | Admit: 2018-10-18 | Discharge: 2018-10-18 | Disposition: A | Discharge status: Home / Self Care | Payer: Medicare Other | Source: Ambulatory Visit | Attending: Urology | Admitting: Urology

## 2018-10-18 DIAGNOSIS — D49511 Neoplasm of unspecified behavior of right kidney: Secondary | ICD-10-CM

## 2018-10-18 MED ORDER — GADOBENATE DIMEGLUMINE 529 MG/ML IV SOLN
16.0000 mL | Freq: Once | INTRAVENOUS | Status: AC | PRN
  Administered 2018-10-18: 16 mL via INTRAVENOUS

## 2018-10-26 ENCOUNTER — Other Ambulatory Visit: Payer: Medicare Other

## 2019-03-01 ENCOUNTER — Other Ambulatory Visit: Payer: Self-pay | Admitting: Cardiovascular Disease

## 2019-03-14 DIAGNOSIS — M25561 Pain in right knee: Secondary | ICD-10-CM | POA: Diagnosis not present

## 2019-03-26 DIAGNOSIS — Z0001 Encounter for general adult medical examination with abnormal findings: Secondary | ICD-10-CM | POA: Diagnosis not present

## 2019-03-26 DIAGNOSIS — E7800 Pure hypercholesterolemia, unspecified: Secondary | ICD-10-CM | POA: Diagnosis not present

## 2019-03-26 DIAGNOSIS — E039 Hypothyroidism, unspecified: Secondary | ICD-10-CM | POA: Diagnosis not present

## 2019-03-26 DIAGNOSIS — E1165 Type 2 diabetes mellitus with hyperglycemia: Secondary | ICD-10-CM | POA: Diagnosis not present

## 2019-03-26 DIAGNOSIS — E559 Vitamin D deficiency, unspecified: Secondary | ICD-10-CM | POA: Diagnosis not present

## 2019-03-26 DIAGNOSIS — I10 Essential (primary) hypertension: Secondary | ICD-10-CM | POA: Diagnosis not present

## 2019-03-30 DIAGNOSIS — E1165 Type 2 diabetes mellitus with hyperglycemia: Secondary | ICD-10-CM | POA: Diagnosis not present

## 2019-03-30 DIAGNOSIS — R002 Palpitations: Secondary | ICD-10-CM | POA: Diagnosis not present

## 2019-03-30 DIAGNOSIS — Z0001 Encounter for general adult medical examination with abnormal findings: Secondary | ICD-10-CM | POA: Diagnosis not present

## 2019-03-30 DIAGNOSIS — I10 Essential (primary) hypertension: Secondary | ICD-10-CM | POA: Diagnosis not present

## 2019-03-30 DIAGNOSIS — C50911 Malignant neoplasm of unspecified site of right female breast: Secondary | ICD-10-CM | POA: Diagnosis not present

## 2019-03-30 DIAGNOSIS — K580 Irritable bowel syndrome with diarrhea: Secondary | ICD-10-CM | POA: Diagnosis not present

## 2019-03-30 DIAGNOSIS — Z6830 Body mass index (BMI) 30.0-30.9, adult: Secondary | ICD-10-CM | POA: Diagnosis not present

## 2019-03-30 DIAGNOSIS — M129 Arthropathy, unspecified: Secondary | ICD-10-CM | POA: Diagnosis not present

## 2019-05-01 ENCOUNTER — Telehealth: Payer: Self-pay | Admitting: Cardiovascular Disease

## 2019-05-01 NOTE — Telephone Encounter (Signed)
   [facility] Pre-operative Risk Assessment    Request for surgical clearance:  1. What type of surgery is being performed? 1.  Trans foraminal Spinal injection  2. When is this surgery scheduled?  1. TBD  3. What type of clearance is required (medical clearance vs. Pharmacy clearance to hold med vs. Both)?  1. Pharmacy  4. Are there any medications that need to be held prior to surgery and how long?  1. Aspirin for 6 days prior   5. Practice name and name of physician performing surgery?  1. Dr. Brien Few at [facility]  6. What is your office phone number. 1.  [phone]   7.   What is your office fax number   1.    [phone]  8.   Anesthesia type (None, local, MAC, general) ?   1.    None    Desma Paganini 05/01/2019, 11:29 AM  _________________________________________________________________   (provider comments below)

## 2019-05-02 NOTE — Telephone Encounter (Signed)
   Primary Cardiologist: Kate Sable, MD  Chart reviewed as part of pre-operative protocol coverage. Given past medical history and time since last visit, based on ACC/AHA guidelines, Cristina Douglas would be at acceptable risk for the planned procedure without further cardiovascular testing.   OK to hold aspirin 5-7 days pre op if needed.  I will route this recommendation to the requesting party via Epic fax function and remove from pre-op pool.  Please call with questions.  Kerin Ransom, PA-C 05/02/2019, 10:10 AM

## 2019-05-17 DIAGNOSIS — M5416 Radiculopathy, lumbar region: Secondary | ICD-10-CM | POA: Diagnosis not present

## 2019-05-22 DIAGNOSIS — M5416 Radiculopathy, lumbar region: Secondary | ICD-10-CM | POA: Diagnosis not present

## 2019-06-11 ENCOUNTER — Encounter: Payer: Self-pay | Admitting: Cardiovascular Disease

## 2019-06-11 DIAGNOSIS — R079 Chest pain, unspecified: Secondary | ICD-10-CM | POA: Diagnosis not present

## 2019-06-11 DIAGNOSIS — I10 Essential (primary) hypertension: Secondary | ICD-10-CM | POA: Diagnosis not present

## 2019-06-11 DIAGNOSIS — R42 Dizziness and giddiness: Secondary | ICD-10-CM | POA: Diagnosis not present

## 2019-06-11 DIAGNOSIS — Z6830 Body mass index (BMI) 30.0-30.9, adult: Secondary | ICD-10-CM | POA: Diagnosis not present

## 2019-06-12 ENCOUNTER — Emergency Department (HOSPITAL_COMMUNITY): Payer: Medicare PPO

## 2019-06-12 ENCOUNTER — Other Ambulatory Visit: Payer: Self-pay

## 2019-06-12 ENCOUNTER — Emergency Department (HOSPITAL_COMMUNITY)
Admission: EM | Admit: 2019-06-12 | Discharge: 2019-06-12 | Disposition: A | Discharge status: Home / Self Care | Payer: Medicare PPO | Attending: Emergency Medicine | Admitting: Emergency Medicine

## 2019-06-12 ENCOUNTER — Encounter (HOSPITAL_COMMUNITY): Payer: Self-pay

## 2019-06-12 ENCOUNTER — Telehealth: Payer: Self-pay | Admitting: Cardiovascular Disease

## 2019-06-12 DIAGNOSIS — E119 Type 2 diabetes mellitus without complications: Secondary | ICD-10-CM | POA: Diagnosis not present

## 2019-06-12 DIAGNOSIS — R55 Syncope and collapse: Secondary | ICD-10-CM | POA: Insufficient documentation

## 2019-06-12 DIAGNOSIS — R0789 Other chest pain: Secondary | ICD-10-CM | POA: Insufficient documentation

## 2019-06-12 DIAGNOSIS — M549 Dorsalgia, unspecified: Secondary | ICD-10-CM | POA: Insufficient documentation

## 2019-06-12 DIAGNOSIS — R42 Dizziness and giddiness: Secondary | ICD-10-CM | POA: Diagnosis not present

## 2019-06-12 DIAGNOSIS — Z7984 Long term (current) use of oral hypoglycemic drugs: Secondary | ICD-10-CM | POA: Insufficient documentation

## 2019-06-12 DIAGNOSIS — E039 Hypothyroidism, unspecified: Secondary | ICD-10-CM | POA: Diagnosis not present

## 2019-06-12 DIAGNOSIS — R079 Chest pain, unspecified: Secondary | ICD-10-CM | POA: Diagnosis not present

## 2019-06-12 DIAGNOSIS — Z853 Personal history of malignant neoplasm of breast: Secondary | ICD-10-CM | POA: Diagnosis not present

## 2019-06-12 DIAGNOSIS — Z79899 Other long term (current) drug therapy: Secondary | ICD-10-CM | POA: Diagnosis not present

## 2019-06-12 DIAGNOSIS — I10 Essential (primary) hypertension: Secondary | ICD-10-CM | POA: Insufficient documentation

## 2019-06-12 DIAGNOSIS — Z7982 Long term (current) use of aspirin: Secondary | ICD-10-CM | POA: Diagnosis not present

## 2019-06-12 LAB — BASIC METABOLIC PANEL
Anion gap: 9 (ref 5–15)
BUN: 10 mg/dL (ref 8–23)
CO2: 29 mmol/L (ref 22–32)
Calcium: 9.5 mg/dL (ref 8.9–10.3)
Chloride: 98 mmol/L (ref 98–111)
Creatinine, Ser: 0.86 mg/dL (ref 0.44–1.00)
GFR calc Af Amer: >60 mL/min (ref >60)
GFR calc non Af Amer: >60 mL/min (ref >60)
Glucose, Bld: 208 mg/dL — ABNORMAL HIGH (ref 70–99)
Potassium: 3.6 mmol/L (ref 3.5–5.1)
Sodium: 136 mmol/L (ref 135–145)

## 2019-06-12 LAB — CBC
HCT: 42.2 % (ref 36.0–46.0)
Hemoglobin: 13.7 g/dL (ref 12.0–15.0)
MCH: 28.7 pg (ref 26.0–34.0)
MCHC: 32.5 g/dL (ref 30.0–36.0)
MCV: 88.5 fL (ref 80.0–100.0)
Platelets: 322 10*3/uL (ref 150–400)
RBC: 4.77 MIL/uL (ref 3.87–5.11)
RDW: 13.4 % (ref 11.5–15.5)
WBC: 5.5 10*3/uL (ref 4.0–10.5)
nRBC: 0 % (ref 0.0–0.2)

## 2019-06-12 LAB — TROPONIN I (HIGH SENSITIVITY)
Troponin I (High Sensitivity): 4 ng/L (ref <18)
Troponin I (High Sensitivity): 4 ng/L (ref <18)

## 2019-06-12 NOTE — Discharge Instructions (Signed)
Stay hydrated   Continue current meds   See your doctor   Return to ER if you have another episode of dizziness, chest pain, passing out

## 2019-06-12 NOTE — Telephone Encounter (Signed)
Had graduation party on Saturday - felt faint this day, lightheaded.  Was concerned that she may have been dehydrated - did drink some Gatorade.    Pain in back & down both arms into finger tips - going on since Sunday.  Saw NP yesterday with her pcp - did EKG & was told there was a change.  Does feel tightness in chest.  SOB - notices slightly - new since this episode started.  Does not feel like a pain, more like pressure.  No nausea.    Suggested she be evaluated at [facility] or [facility] as these are new symptoms & states that she just does not feel like herself.    EKG requested from pcp.

## 2019-06-12 NOTE — ED Triage Notes (Signed)
Pt has multiple complaints:  Pt reports over the weekend she was at a graduation party outside, suddenly felt dizzy and almost passed out. Pt also reports pain in her back and tightness in her chest. Pt was seen by PCP yesterday and was told to come here if her pain got worse. Pt a.o, nad noted.

## 2019-06-12 NOTE — Telephone Encounter (Signed)
Attempted to return call - answers to loud beeping noise.

## 2019-06-12 NOTE — Telephone Encounter (Signed)
Patient called stating that she was seen by PCP -06/11/2019 - (EKG done-was told a change was being seen) patient states that she is having a dullness in chest and pain in both arms and back. Suggested that she contact Dr. Bronson Ing today.

## 2019-06-12 NOTE — ED Provider Notes (Signed)
[facility] EMERGENCY DEPARTMENT Provider Note   CSN: [id number] Arrival date & time: 06/12/19  1140     History Chief Complaint  Patient presents with  . Dizziness  . Near Syncope  . Back Pain  . Chest Pain    Jonice Cerra Klimaszewski is a 84 y.o. female history of basal cell carcinoma, diabetes, mitral valve insufficiency.  Presenting with dizziness and chest pressure .  Patient states that she was at a graduation party 3 days ago.  She states that she did not eat or drink that much during the party.  She then had some dizziness.  She states that she feels lightheaded.  She also has some chest pain as well that is going on for several days.  The pain is nonexertional or pleuritic.  Went to see cardiology NP yesterday and had nonspecific EKG changes.  She was told that if she has persistent symptoms, she should visit the ER.  She states that her dizziness and chest pressure has gotten better.  Denies any chest pain at rest or exertion.  Denies passing out.  The history is provided by the patient.       Past Medical History:  Diagnosis Date  . Basal cell carcinoma   . Breast cancer ([place])   . Cataract   . Diabetes mellitus without complication ([place])   . Diverticulitis   . Diverticulosis   . Hypothyroidism   . Mitral valve insufficiency and aortic valve insufficiency    echo-2009-mild  . Other and unspecified hyperlipidemia   . Personal history of radiation therapy   . Unspecified essential hypertension     Patient Active Problem List   Diagnosis Date Noted  . Breast cancer of lower-outer quadrant of right female breast ([place]) 04/12/2013  . Palpitations 09/01/2010  . Orthostatic hypotension 09/01/2010  . HYPERLIPIDEMIA-MIXED 07/25/2008  . MITRAL REGURGITATION, 0 (MILD) 07/25/2008  . Essential hypertension 07/25/2008    Past Surgical History:  Procedure Laterality Date  . APPENDECTOMY    . BREAST BIOPSY Right 04/10/2013  . BREAST LUMPECTOMY Right 05/09/2013   . BREAST LUMPECTOMY WITH RADIOACTIVE SEED LOCALIZATION Right 05/09/2013   Procedure: BREAST LUMPECTOMY WITH RADIOACTIVE SEED LOCALIZATION;  Surgeon: Adin Hector, MD;  Location: [facility];  Service: General;  Laterality: Right;  . COLONOSCOPY    . EYE SURGERY     both cataracts  . TONSILLECTOMY    . TUBAL LIGATION       OB History   No obstetric history on file.     Family History  Problem Relation Age of Onset  . Lung cancer Paternal Aunt     Social History   Tobacco Use  . Smoking status: Never Smoker  . Smokeless tobacco: Never Used  Substance Use Topics  . Alcohol use: Yes    Comment: occas- 10/2/15Rarely drinks wine - AJ  . Drug use: No    Home Medications Prior to Admission medications   Medication Sig Start Date End Date Taking? Authorizing Provider  aspirin 81 MG tablet Take 81 mg by mouth at bedtime.     [provider]  atorvastatin (LIPITOR) 80 MG tablet Take 80 mg by mouth at bedtime.  08/20/10   [provider]  Calcium Carbonate-Vitamin D (CALTRATE 600+D) 600-400 MG-UNIT per tablet Take 1 tablet by mouth 2 (two) times daily.     [provider]  cholecalciferol (VITAMIN D) 1000 UNITS tablet Take 1,000 Units by mouth daily.  [provider]  Coenzyme Q10 (CO Q 10) 100 MG CAPS Take 50 mg by mouth daily.     [provider]  glucosamine-chondroitin 500-400 MG tablet Take 1 tablet by mouth 2 (two) times daily.     [provider]  levothyroxine (SYNTHROID, LEVOTHROID) 88 MCG tablet Take 88 mcg by mouth daily.      [provider]  losartan-hydrochlorothiazide (HYZAAR) 50-12.5 MG per tablet Take 1 tablet by mouth 2 (two) times daily.      [provider]  MAGNESIUM OXIDE PO Take 1 tablet by mouth at bedtime.     [provider]  metFORMIN (GLUCOPHAGE) 500 MG tablet Take 2 tablets (1,000 mg total) by mouth 2 (two) times daily with a meal. Take 1 tablet in the  morning and 2 in evening DR [facility] Patient taking differently: Take 1,000 mg by mouth 2 (two) times daily with a meal.  08/03/18   Nicholas Lose, MD  metoprolol tartrate (LOPRESSOR) 25 MG tablet TAKE 1 TABLET BY MOUTH EVERY DAY 03/01/19   Herminio Commons, MD  Misc Natural Products (TURMERIC CURCUMIN) CAPS Take 1 capsule by mouth daily.     [provider]  Multiple Vitamin (MULTIVITAMIN) tablet Take 1 tablet by mouth daily.      [provider]    Allergies    Sulfa antibiotics  Review of Systems   Review of Systems  Cardiovascular: Positive for chest pain and near-syncope.  Musculoskeletal: Positive for back pain.  Neurological: Positive for dizziness.  All other systems reviewed and are negative.   Physical Exam Updated Vital Signs BP (!) 142/80   Pulse 67   Temp 98.9 F (37.2 C) (Oral)   Resp 18   Ht 5\' 3"  (1.6 m)   Wt 76.7 kg   SpO2 97%   BMI 29.94 kg/m   Physical Exam Vitals and nursing note reviewed.  HENT:     Head: Normocephalic.  Eyes:     Pupils: Pupils are equal, round, and reactive to light.  Cardiovascular:     Rate and Rhythm: Normal rate and regular rhythm.     Heart sounds: Normal heart sounds.  Pulmonary:     Effort: Pulmonary effort is normal.     Breath sounds: Normal breath sounds.  Abdominal:     General: Bowel sounds are normal.     Palpations: Abdomen is soft.  Musculoskeletal:        General: Normal range of motion.     Cervical back: Normal range of motion.  Skin:    General: Skin is warm.     Capillary Refill: Capillary refill takes less than 2 seconds.  Neurological:     General: No focal deficit present.     Mental Status: She is alert and oriented to person, place, and time.     Cranial Nerves: No cranial nerve deficit.     Motor: No weakness.     Comments: CN 2- 12 intact, nl strength and sensation throughout, nl finger to nose bilaterally, nl gait   Psychiatric:        Mood and Affect: Mood normal.      ED Results / Procedures / Treatments   Labs (all labs ordered are listed, but only abnormal results are displayed) Labs Reviewed  BASIC METABOLIC PANEL - Abnormal; Notable for the following components:      Result Value   Glucose, Bld 208 (*)    All other components within normal limits  CBC  TROPONIN I (HIGH SENSITIVITY)  TROPONIN I (HIGH SENSITIVITY)    EKG EKG Interpretation  Date/Time:  Tuesday June 12 2019 11:40:00 EDT Ventricular Rate:  68 PR Interval:  160 QRS Duration: 68 QT Interval:  406 QTC Calculation: 431 R Axis:   34 Text Interpretation: Normal sinus rhythm Low voltage QRS Septal infarct , age undetermined Abnormal ECG No previous ECGs available Confirmed by Wandra Arthurs ([id number]) on 06/12/2019 5:06:10 PM   Radiology DG Chest 2 View  Result Date: 06/12/2019 CLINICAL DATA:  Recent syncopal episode EXAM: CHEST - 2 VIEW COMPARISON:  09/25/2018 FINDINGS: Cardiac shadow is within normal limits. Mild aortic calcifications are seen. The lungs are well aerated bilaterally. No focal infiltrate or effusion is seen. No bony abnormality is noted. IMPRESSION: No acute abnormality seen. Electronically Signed   By: Inez Catalina M.D.   On: 06/12/2019 12:36    Procedures Procedures (including critical care time)  Medications Ordered in ED Medications - No data to display  ED Course  I have reviewed the triage vital signs and the nursing notes.  Pertinent labs & imaging results that were available during my care of the patient were reviewed by me and considered in my medical decision making (see chart for details).    MDM Rules/Calculators/A&P                      Shareese Macha Alridge is a 84 y.o. female with dizziness and chest pressure. Symptoms going on for the last several days .  It started after she was at a graduation party and did not eat or drink that much.   I suspect some mild orthostasis and dehydration.  I reviewed her EKG from yesterday at cardiology office  and has nonspecific T wave changes.  Her EKG today is very nonspecific there is no obvious ischemic changes. Her troponin today is normal and her symptoms are going on for several days have low suspicion for ACS.  She has nonfocal neuro exam currently and I doubt posterior circulation stroke.  Told her to stay hydrated.  Her electrolytes are unremarkable.  Stable for discharge.  Final Clinical Impression(s) / ED Diagnoses Final diagnoses:  None    Rx / DC Orders ED Discharge Orders    None       Drenda Freeze, MD 06/12/19 1730

## 2019-06-15 ENCOUNTER — Telehealth: Payer: Self-pay | Admitting: Cardiovascular Disease

## 2019-06-15 NOTE — Telephone Encounter (Signed)
Pt wanted to verify she was not having PVC's on her last EKG done at [facility] - made aware that she was in NSR and EKG did not indicate any PVC's - pt requested to see Dr Bronson Ing sooner that October - scheduled 7/28 and pt aware that if palpitations/chest pain start occurring again to call us or go back to ED

## 2019-06-15 NOTE — Telephone Encounter (Signed)
Patient was concerned about her test that were performed when she went to the ER @ [facility]. States she is wondering if her medications need to be increased.

## 2019-07-18 DIAGNOSIS — Z6830 Body mass index (BMI) 30.0-30.9, adult: Secondary | ICD-10-CM | POA: Diagnosis not present

## 2019-07-18 DIAGNOSIS — Z01419 Encounter for gynecological examination (general) (routine) without abnormal findings: Secondary | ICD-10-CM | POA: Diagnosis not present

## 2019-07-19 DIAGNOSIS — M85852 Other specified disorders of bone density and structure, left thigh: Secondary | ICD-10-CM | POA: Diagnosis not present

## 2019-07-20 ENCOUNTER — Other Ambulatory Visit: Payer: Self-pay | Admitting: Obstetrics and Gynecology

## 2019-07-20 DIAGNOSIS — Z1231 Encounter for screening mammogram for malignant neoplasm of breast: Secondary | ICD-10-CM

## 2019-07-25 ENCOUNTER — Ambulatory Visit: Payer: Medicare PPO | Admitting: Cardiology

## 2019-07-26 DIAGNOSIS — E039 Hypothyroidism, unspecified: Secondary | ICD-10-CM | POA: Diagnosis not present

## 2019-07-26 DIAGNOSIS — E7800 Pure hypercholesterolemia, unspecified: Secondary | ICD-10-CM | POA: Diagnosis not present

## 2019-07-26 DIAGNOSIS — R739 Hyperglycemia, unspecified: Secondary | ICD-10-CM | POA: Diagnosis not present

## 2019-07-26 DIAGNOSIS — E1165 Type 2 diabetes mellitus with hyperglycemia: Secondary | ICD-10-CM | POA: Diagnosis not present

## 2019-07-26 DIAGNOSIS — I10 Essential (primary) hypertension: Secondary | ICD-10-CM | POA: Diagnosis not present

## 2019-08-01 ENCOUNTER — Ambulatory Visit: Payer: Medicare PPO | Admitting: Cardiovascular Disease

## 2019-08-02 ENCOUNTER — Other Ambulatory Visit: Payer: Self-pay

## 2019-08-02 ENCOUNTER — Ambulatory Visit
Admission: RE | Admit: 2019-08-02 | Discharge: 2019-08-02 | Disposition: A | Discharge status: Home / Self Care | Payer: Medicare PPO | Source: Ambulatory Visit | Attending: Obstetrics and Gynecology | Admitting: Obstetrics and Gynecology

## 2019-08-02 DIAGNOSIS — C649 Malignant neoplasm of unspecified kidney, except renal pelvis: Secondary | ICD-10-CM | POA: Diagnosis not present

## 2019-08-02 DIAGNOSIS — E7800 Pure hypercholesterolemia, unspecified: Secondary | ICD-10-CM | POA: Diagnosis not present

## 2019-08-02 DIAGNOSIS — Z1231 Encounter for screening mammogram for malignant neoplasm of breast: Secondary | ICD-10-CM

## 2019-08-02 DIAGNOSIS — M899 Disorder of bone, unspecified: Secondary | ICD-10-CM | POA: Diagnosis not present

## 2019-08-02 DIAGNOSIS — R739 Hyperglycemia, unspecified: Secondary | ICD-10-CM | POA: Diagnosis not present

## 2019-08-02 DIAGNOSIS — E039 Hypothyroidism, unspecified: Secondary | ICD-10-CM | POA: Diagnosis not present

## 2019-08-02 DIAGNOSIS — I10 Essential (primary) hypertension: Secondary | ICD-10-CM | POA: Diagnosis not present

## 2019-08-02 DIAGNOSIS — E1165 Type 2 diabetes mellitus with hyperglycemia: Secondary | ICD-10-CM | POA: Diagnosis not present

## 2019-08-16 DIAGNOSIS — D692 Other nonthrombocytopenic purpura: Secondary | ICD-10-CM | POA: Diagnosis not present

## 2019-08-16 DIAGNOSIS — Z85828 Personal history of other malignant neoplasm of skin: Secondary | ICD-10-CM | POA: Diagnosis not present

## 2019-08-16 DIAGNOSIS — D1801 Hemangioma of skin and subcutaneous tissue: Secondary | ICD-10-CM | POA: Diagnosis not present

## 2019-08-16 DIAGNOSIS — L570 Actinic keratosis: Secondary | ICD-10-CM | POA: Diagnosis not present

## 2019-08-16 DIAGNOSIS — L821 Other seborrheic keratosis: Secondary | ICD-10-CM | POA: Diagnosis not present

## 2019-08-16 DIAGNOSIS — L820 Inflamed seborrheic keratosis: Secondary | ICD-10-CM | POA: Diagnosis not present

## 2019-08-20 DIAGNOSIS — M47816 Spondylosis without myelopathy or radiculopathy, lumbar region: Secondary | ICD-10-CM | POA: Diagnosis not present

## 2019-08-20 DIAGNOSIS — M5441 Lumbago with sciatica, right side: Secondary | ICD-10-CM | POA: Diagnosis not present

## 2019-08-20 DIAGNOSIS — M9903 Segmental and somatic dysfunction of lumbar region: Secondary | ICD-10-CM | POA: Diagnosis not present

## 2019-08-21 DIAGNOSIS — M5441 Lumbago with sciatica, right side: Secondary | ICD-10-CM | POA: Diagnosis not present

## 2019-08-21 DIAGNOSIS — M47816 Spondylosis without myelopathy or radiculopathy, lumbar region: Secondary | ICD-10-CM | POA: Diagnosis not present

## 2019-08-21 DIAGNOSIS — M9903 Segmental and somatic dysfunction of lumbar region: Secondary | ICD-10-CM | POA: Diagnosis not present

## 2019-08-22 DIAGNOSIS — M47816 Spondylosis without myelopathy or radiculopathy, lumbar region: Secondary | ICD-10-CM | POA: Diagnosis not present

## 2019-08-22 DIAGNOSIS — M9903 Segmental and somatic dysfunction of lumbar region: Secondary | ICD-10-CM | POA: Diagnosis not present

## 2019-08-22 DIAGNOSIS — M5441 Lumbago with sciatica, right side: Secondary | ICD-10-CM | POA: Diagnosis not present

## 2019-08-23 DIAGNOSIS — M47816 Spondylosis without myelopathy or radiculopathy, lumbar region: Secondary | ICD-10-CM | POA: Diagnosis not present

## 2019-08-23 DIAGNOSIS — M5441 Lumbago with sciatica, right side: Secondary | ICD-10-CM | POA: Diagnosis not present

## 2019-08-23 DIAGNOSIS — M9903 Segmental and somatic dysfunction of lumbar region: Secondary | ICD-10-CM | POA: Diagnosis not present

## 2019-08-24 ENCOUNTER — Other Ambulatory Visit: Payer: Self-pay | Admitting: Neurological Surgery

## 2019-08-24 DIAGNOSIS — M5416 Radiculopathy, lumbar region: Secondary | ICD-10-CM

## 2019-08-27 DIAGNOSIS — M5441 Lumbago with sciatica, right side: Secondary | ICD-10-CM | POA: Diagnosis not present

## 2019-08-27 DIAGNOSIS — M9903 Segmental and somatic dysfunction of lumbar region: Secondary | ICD-10-CM | POA: Diagnosis not present

## 2019-08-27 DIAGNOSIS — M47816 Spondylosis without myelopathy or radiculopathy, lumbar region: Secondary | ICD-10-CM | POA: Diagnosis not present

## 2019-08-29 DIAGNOSIS — M47816 Spondylosis without myelopathy or radiculopathy, lumbar region: Secondary | ICD-10-CM | POA: Diagnosis not present

## 2019-08-29 DIAGNOSIS — M9903 Segmental and somatic dysfunction of lumbar region: Secondary | ICD-10-CM | POA: Diagnosis not present

## 2019-08-29 DIAGNOSIS — M5441 Lumbago with sciatica, right side: Secondary | ICD-10-CM | POA: Diagnosis not present

## 2019-09-02 ENCOUNTER — Ambulatory Visit
Admission: RE | Admit: 2019-09-02 | Discharge: 2019-09-02 | Disposition: A | Discharge status: Home / Self Care | Payer: Medicare PPO | Source: Ambulatory Visit | Attending: Neurological Surgery | Admitting: Neurological Surgery

## 2019-09-02 DIAGNOSIS — M48061 Spinal stenosis, lumbar region without neurogenic claudication: Secondary | ICD-10-CM | POA: Diagnosis not present

## 2019-09-02 DIAGNOSIS — M5416 Radiculopathy, lumbar region: Secondary | ICD-10-CM

## 2019-09-03 ENCOUNTER — Other Ambulatory Visit: Payer: Self-pay | Admitting: Cardiology

## 2019-09-03 DIAGNOSIS — M47816 Spondylosis without myelopathy or radiculopathy, lumbar region: Secondary | ICD-10-CM | POA: Diagnosis not present

## 2019-09-03 DIAGNOSIS — M5441 Lumbago with sciatica, right side: Secondary | ICD-10-CM | POA: Diagnosis not present

## 2019-09-03 DIAGNOSIS — M9903 Segmental and somatic dysfunction of lumbar region: Secondary | ICD-10-CM | POA: Diagnosis not present

## 2019-09-05 DIAGNOSIS — M5416 Radiculopathy, lumbar region: Secondary | ICD-10-CM | POA: Diagnosis not present

## 2019-09-07 DIAGNOSIS — M5441 Lumbago with sciatica, right side: Secondary | ICD-10-CM | POA: Diagnosis not present

## 2019-09-07 DIAGNOSIS — M47816 Spondylosis without myelopathy or radiculopathy, lumbar region: Secondary | ICD-10-CM | POA: Diagnosis not present

## 2019-09-07 DIAGNOSIS — M9903 Segmental and somatic dysfunction of lumbar region: Secondary | ICD-10-CM | POA: Diagnosis not present

## 2019-09-14 DIAGNOSIS — M9903 Segmental and somatic dysfunction of lumbar region: Secondary | ICD-10-CM | POA: Diagnosis not present

## 2019-09-14 DIAGNOSIS — M5441 Lumbago with sciatica, right side: Secondary | ICD-10-CM | POA: Diagnosis not present

## 2019-09-14 DIAGNOSIS — M47816 Spondylosis without myelopathy or radiculopathy, lumbar region: Secondary | ICD-10-CM | POA: Diagnosis not present

## 2019-09-20 DIAGNOSIS — M5416 Radiculopathy, lumbar region: Secondary | ICD-10-CM | POA: Diagnosis not present

## 2019-09-20 DIAGNOSIS — M48061 Spinal stenosis, lumbar region without neurogenic claudication: Secondary | ICD-10-CM | POA: Diagnosis not present

## 2019-09-20 DIAGNOSIS — M7138 Other bursal cyst, other site: Secondary | ICD-10-CM | POA: Diagnosis not present

## 2019-09-24 ENCOUNTER — Other Ambulatory Visit: Payer: Medicare PPO

## 2019-10-08 DIAGNOSIS — M25511 Pain in right shoulder: Secondary | ICD-10-CM | POA: Diagnosis not present

## 2019-10-08 DIAGNOSIS — M25811 Other specified joint disorders, right shoulder: Secondary | ICD-10-CM | POA: Diagnosis not present

## 2019-10-09 ENCOUNTER — Encounter: Payer: Self-pay | Admitting: Cardiology

## 2019-10-09 NOTE — Progress Notes (Signed)
Cardiology Office Note  Date: 10/10/2019   ID: Cristina Douglas, DOB 04/08/1935, MRN [id number]  PCP:  Rory Percy, MD  Cardiologist:  Rozann Lesches, MD Electrophysiologist:  None   Chief Complaint  Patient presents with   Cardiac follow-up    History of Present Illness: Cristina Douglas is an 84 y.o. female former patient of Dr. Bronson Ing now presenting to establish follow-up with me.  I reviewed her records and updated the chart.  She was last seen in October 2020.  She presents for a routine visit, reports no significant palpitations, no syncope.  She continues to take Lopressor 25 mg once in the morning for palpitations, did not tolerate twice daily dosing.  She states that she had a recent spine surgery for removal of the cyst, doing very well at this time.  She anticipates right shoulder surgery with Dr. Onnie Graham in early November.  She does not report any other major health changes and continues to follow with [facility] for primary care.  Past Medical History:  Diagnosis Date   Basal cell carcinoma    Breast cancer ([place])    DCIS right breast status post radiation and lumpectomy.   Cataract    Diverticulitis    Diverticulosis    Essential hypertension    Hyperlipidemia    Hypothyroidism    PVC's (premature ventricular contractions)    Type 2 diabetes mellitus ([place])     Past Surgical History:  Procedure Laterality Date   APPENDECTOMY     BREAST BIOPSY Right 04/10/2013   BREAST LUMPECTOMY Right 05/09/2013   BREAST LUMPECTOMY WITH RADIOACTIVE SEED LOCALIZATION Right 05/09/2013   Procedure: BREAST LUMPECTOMY WITH RADIOACTIVE SEED LOCALIZATION;  Surgeon: Adin Hector, MD;  Location: [facility];  Service: General;  Laterality: Right;   COLONOSCOPY     EYE SURGERY     both cataracts   TONSILLECTOMY     TUBAL LIGATION      Current Outpatient Medications  Medication Sig Dispense Refill   aspirin 81 MG tablet Take 81 mg  by mouth at bedtime.      atorvastatin (LIPITOR) 80 MG tablet Take 80 mg by mouth at bedtime.      Calcium Carbonate-Vitamin D (CALTRATE 600+D) 600-400 MG-UNIT per tablet Take 1 tablet by mouth 2 (two) times daily.      cholecalciferol (VITAMIN D) 1000 UNITS tablet Take 1,000 Units by mouth daily.       Coenzyme Q10 (CO Q 10) 100 MG CAPS Take 50 mg by mouth daily.      glucosamine-chondroitin 500-400 MG tablet Take 1 tablet by mouth 2 (two) times daily.      levothyroxine (SYNTHROID, LEVOTHROID) 88 MCG tablet Take 88 mcg by mouth daily.       losartan-hydrochlorothiazide (HYZAAR) 50-12.5 MG per tablet Take 1 tablet by mouth 2 (two) times daily.       MAGNESIUM OXIDE PO Take 1 tablet by mouth at bedtime.      metFORMIN (GLUCOPHAGE) 500 MG tablet Take 2 tablets (1,000 mg total) by mouth 2 (two) times daily with a meal. Take 1 tablet in the morning and 2 in evening DR Soyla Murphy (Patient taking differently: Take 1,000 mg by mouth 2 (two) times daily with a meal. )     metoprolol tartrate (LOPRESSOR) 25 MG tablet TAKE 1 TABLET BY MOUTH EVERY DAY 90 tablet 1   Misc Natural Products (TURMERIC CURCUMIN) CAPS Take 1 capsule by mouth daily.      Multiple  Vitamin (MULTIVITAMIN) tablet Take 1 tablet by mouth daily.       TURMERIC PO Take by mouth.     No current facility-administered medications for this visit.   Allergies:  Sulfa antibiotics   ROS:   No syncope.  Physical Exam: VS:  BP 140/78    Pulse 63    Ht 5\' 3"  (1.6 m)    Wt 168 lb (76.2 kg)    SpO2 96%    BMI 29.76 kg/m , BMI Body mass index is 29.76 kg/m.  Wt Readings from Last 3 Encounters:  10/10/19 168 lb (76.2 kg)  06/12/19 169 lb (76.7 kg)  10/17/18 173 lb 12.8 oz (78.8 kg)    General: Elderly woman.  Patient appears comfortable at rest. HEENT: Conjunctiva and lids normal, wearing a mask. Neck: Supple, no elevated JVP or carotid bruits. Lungs: Clear to auscultation, nonlabored breathing at rest. Cardiac: Regular rate and  rhythm, no S3 or significant systolic murmur, no pericardial rub. Extremities: No pitting edema.  ECG:  An ECG dated 06/13/2019 was personally reviewed today and demonstrated:  Sinus rhythm with low voltage and lead motion artifact.  Recent Labwork: 06/12/2019: BUN 10; Creatinine, Ser 0.86; Hemoglobin 13.7; Platelets 322; Potassium 3.6; Sodium 136   Other Studies Reviewed Today:  Chest x-ray 06/12/2019: FINDINGS: Cardiac shadow is within normal limits. Mild aortic calcifications are seen. The lungs are well aerated bilaterally. No focal infiltrate or effusion is seen. No bony abnormality is noted.  IMPRESSION: No acute abnormality seen.  Assessment and Plan:  1.  History of palpitations and previously documented PVCs.  She does not describe any progressive symptoms and continues on low-dose Lopressor as discussed above.  I reviewed her ECG from June.  Plan to continue observation at this point.  2.  Essential hypertension, blood pressure is mildly elevated today.  She is on Hyzaar with follow-up at [facility].  Medication Adjustments/Labs and Tests Ordered: Current medicines are reviewed at length with the patient today.  Concerns regarding medicines are outlined above.   Tests Ordered: No orders of the defined types were placed in this encounter.   Medication Changes: No orders of the defined types were placed in this encounter.   Disposition:  Follow up 1 year in the [place] office.  Signed, Satira Sark, MD, [facility] 10/10/2019 11:45 AM    [place] [facility] [place], [place], [place] [address] Phone: [phone]; Fax: [phone]

## 2019-10-10 ENCOUNTER — Encounter: Payer: Self-pay | Admitting: Cardiology

## 2019-10-10 ENCOUNTER — Ambulatory Visit: Payer: Medicare PPO | Admitting: Cardiology

## 2019-10-10 VITALS — BP 140/78 | HR 63 | Ht 63.0 in | Wt 168.0 lb

## 2019-10-10 DIAGNOSIS — I493 Ventricular premature depolarization: Secondary | ICD-10-CM | POA: Diagnosis not present

## 2019-10-10 DIAGNOSIS — I10 Essential (primary) hypertension: Secondary | ICD-10-CM | POA: Diagnosis not present

## 2019-10-10 NOTE — Patient Instructions (Addendum)

## 2019-10-11 ENCOUNTER — Ambulatory Visit: Payer: Medicare PPO

## 2019-10-17 DIAGNOSIS — Z23 Encounter for immunization: Secondary | ICD-10-CM | POA: Diagnosis not present

## 2019-10-19 ENCOUNTER — Other Ambulatory Visit (HOSPITAL_COMMUNITY): Payer: Self-pay | Admitting: Urology

## 2019-10-19 ENCOUNTER — Other Ambulatory Visit: Payer: Self-pay | Admitting: Urology

## 2019-10-19 DIAGNOSIS — D49511 Neoplasm of unspecified behavior of right kidney: Secondary | ICD-10-CM

## 2019-10-25 DIAGNOSIS — D49511 Neoplasm of unspecified behavior of right kidney: Secondary | ICD-10-CM | POA: Diagnosis not present

## 2019-10-27 DIAGNOSIS — E119 Type 2 diabetes mellitus without complications: Secondary | ICD-10-CM | POA: Diagnosis not present

## 2019-10-27 DIAGNOSIS — I517 Cardiomegaly: Secondary | ICD-10-CM | POA: Diagnosis not present

## 2019-10-27 DIAGNOSIS — R079 Chest pain, unspecified: Secondary | ICD-10-CM | POA: Diagnosis not present

## 2019-10-27 DIAGNOSIS — M549 Dorsalgia, unspecified: Secondary | ICD-10-CM | POA: Diagnosis not present

## 2019-10-27 DIAGNOSIS — I10 Essential (primary) hypertension: Secondary | ICD-10-CM | POA: Diagnosis not present

## 2019-10-27 DIAGNOSIS — R0789 Other chest pain: Secondary | ICD-10-CM | POA: Diagnosis not present

## 2019-10-27 DIAGNOSIS — Z79899 Other long term (current) drug therapy: Secondary | ICD-10-CM | POA: Diagnosis not present

## 2019-10-27 DIAGNOSIS — E785 Hyperlipidemia, unspecified: Secondary | ICD-10-CM | POA: Diagnosis not present

## 2019-10-27 DIAGNOSIS — Z882 Allergy status to sulfonamides status: Secondary | ICD-10-CM | POA: Diagnosis not present

## 2019-10-27 DIAGNOSIS — D72829 Elevated white blood cell count, unspecified: Secondary | ICD-10-CM | POA: Diagnosis not present

## 2019-10-27 DIAGNOSIS — Z7984 Long term (current) use of oral hypoglycemic drugs: Secondary | ICD-10-CM | POA: Diagnosis not present

## 2019-10-27 DIAGNOSIS — I472 Ventricular tachycardia: Secondary | ICD-10-CM | POA: Diagnosis not present

## 2019-10-27 DIAGNOSIS — E872 Acidosis: Secondary | ICD-10-CM | POA: Diagnosis not present

## 2019-10-28 DIAGNOSIS — E119 Type 2 diabetes mellitus without complications: Secondary | ICD-10-CM | POA: Diagnosis not present

## 2019-10-28 DIAGNOSIS — Z882 Allergy status to sulfonamides status: Secondary | ICD-10-CM | POA: Diagnosis not present

## 2019-10-28 DIAGNOSIS — I472 Ventricular tachycardia: Secondary | ICD-10-CM | POA: Diagnosis not present

## 2019-10-28 DIAGNOSIS — I493 Ventricular premature depolarization: Secondary | ICD-10-CM | POA: Diagnosis not present

## 2019-10-28 DIAGNOSIS — I272 Pulmonary hypertension, unspecified: Secondary | ICD-10-CM | POA: Diagnosis not present

## 2019-10-28 DIAGNOSIS — E785 Hyperlipidemia, unspecified: Secondary | ICD-10-CM | POA: Diagnosis not present

## 2019-10-28 DIAGNOSIS — E876 Hypokalemia: Secondary | ICD-10-CM | POA: Diagnosis not present

## 2019-10-28 DIAGNOSIS — R42 Dizziness and giddiness: Secondary | ICD-10-CM | POA: Diagnosis not present

## 2019-10-28 DIAGNOSIS — I10 Essential (primary) hypertension: Secondary | ICD-10-CM | POA: Diagnosis not present

## 2019-10-28 DIAGNOSIS — R0789 Other chest pain: Secondary | ICD-10-CM | POA: Diagnosis not present

## 2019-10-28 DIAGNOSIS — Z853 Personal history of malignant neoplasm of breast: Secondary | ICD-10-CM | POA: Diagnosis not present

## 2019-10-30 ENCOUNTER — Other Ambulatory Visit (HOSPITAL_COMMUNITY): Payer: Medicare PPO

## 2019-10-31 ENCOUNTER — Telehealth: Payer: Self-pay | Admitting: Cardiology

## 2019-10-31 DIAGNOSIS — I472 Ventricular tachycardia: Secondary | ICD-10-CM | POA: Diagnosis not present

## 2019-10-31 NOTE — Telephone Encounter (Signed)
Patient called stating she would like Dr. Domenic Polite to review her records from [facility]   Has an apt w/ [facility] for f/u, but would only like to see Dr. Domenic Polite, currently there are no apts available.

## 2019-10-31 NOTE — Patient Instructions (Addendum)
DUE TO COVID-19 ONLY ONE VISITOR IS ALLOWED TO COME WITH YOU AND STAY IN THE WAITING ROOM ONLY DURING PRE OP AND PROCEDURE DAY OF SURGERY. THE 1 VISITOR  MAY VISIT WITH YOU AFTER SURGERY IN YOUR PRIVATE ROOM DURING VISITING HOURS ONLY!  YOU NEED TO HAVE A COVID 19 TEST ON_11/1______ @_______ , THIS TEST MUST BE DONE BEFORE SURGERY,  COVID TESTING SITE [address] [place] [address], IT IS ON THE RIGHT GOING OUT [address] APPROXIMATELY  2 MINUTES PAST [employer] ON THE RIGHT. ONCE YOUR COVID TEST IS COMPLETED,  PLEASE BEGIN THE QUARANTINE INSTRUCTIONS AS OUTLINED IN YOUR HANDOUT.                Cristina Douglas    Your procedure is scheduled on: 11/08/19   Report to [facility] Main  Entrance   Report to admitting at   7:30 AM     Call this number if you have problems the morning of surgery Cristina Douglas, NO CHEWING GUM Cristina Douglas.   No food after midnight  .  You may have clear liquid until 7:00  AM.    At 7:00 AM drink pre surgery drink  . Nothing by mouth after 7:00  AM.   Take these medicines the morning of surgery with A SIP OF WATER: Metoprolol, Levothyroxine  DO NOT TAKE ANY DIABETIC MEDICATIONS DAY OF YOUR SURGERY    How to Manage Your Diabetes Before and After Surgery  Why is it important to control my blood sugar before and after surgery? . Improving blood sugar levels before and after surgery helps healing and can limit problems. . A way of improving blood sugar control is eating a healthy diet by: o  Eating less sugar and carbohydrates o  Increasing activity/exercise o  Talking with your doctor about reaching your blood sugar goals . High blood sugars (greater than 180 mg/dL) can raise your risk of infections and slow your recovery, so you will need to focus on controlling your diabetes during the weeks before surgery. . Make sure that the doctor who takes care of  your diabetes knows about your planned surgery including the date and location.  How do I manage my blood sugar before surgery? . Check your blood sugar at least 4 times a day, starting 2 days before surgery, to make sure that the level is not too high or low. o Check your blood sugar the morning of your surgery when you wake up and every 2 hours until you get to the Short Stay unit. . If your blood sugar is less than 70 mg/dL, you will need to treat for low blood sugar: o Do not take insulin. o Treat a low blood sugar (less than 70 mg/dL) with  cup of clear juice (cranberry or apple), 4 glucose tablets, OR glucose gel. o Recheck blood sugar in 15 minutes after treatment (to make sure it is greater than 70 mg/dL). If your blood sugar is not greater than 70 mg/dL on recheck, call [phone] for further instructions. . Report your blood sugar to the short stay nurse when you get to Short Stay.  . If you are admitted to the hospital after surgery: o Your blood sugar will be checked by the staff and you will probably be given insulin after surgery (instead of oral diabetes medicines) to make sure you have good blood sugar levels.  o The goal for blood sugar control after surgery is 80-180 mg/dL.   WHAT DO I DO ABOUT MY DIABETES MEDICATION?  Marland Kitchen Do not take oral diabetes medicines (pills) the morning of surgery.                            You may not have any metal on your body including hair pins and              piercings  Do not wear jewelry, make-up, lotions, powders or perfumes, deodorant             Do not wear nail polish on your fingernails.  Do not shave  48 hours prior to surgery.     Do not bring valuables to the hospital. [place].  Contacts, dentures or bridgework may not be worn into surgery.      Patients discharged the day of surgery will not be allowed to drive home.   IF YOU ARE HAVING SURGERY AND GOING HOME THE SAME  DAY, YOU MUST HAVE AN ADULT TO DRIVE YOU HOME AND BE WITH YOU FOR 24 HOURS.   YOU MAY GO HOME BY TAXI OR UBER OR ORTHERWISE, BUT AN ADULT MUST ACCOMPANY YOU HOME AND STAY WITH YOU FOR 24 HOURS.  Name and phone number of your driver:  Special Instructions: N/A              Please read over the following fact sheets you were given: _____________________________________________________________________             [facility]- Preparing for Total Shoulder Arthroplasty    Before surgery, you can play an important role. Because skin is not sterile, your skin needs to be as free of germs as possible. You can reduce the number of germs on your skin by using the following products. . Benzoyl Peroxide Gel o Reduces the number of germs present on the skin o Applied twice a day to shoulder area starting two days before surgery    ==================================================================  Please follow these instructions carefully:  BENZOYL PEROXIDE 5% GEL  Please do not use if you have an allergy to benzoyl peroxide.   If your skin becomes reddened/irritated stop using the benzoyl peroxide.  Starting two days before surgery, apply as follows: 1. Apply benzoyl peroxide in the morning and at night. Apply after taking a shower. If you are not taking a shower clean entire shoulder front, back, and side along with the armpit with a clean wet washcloth.  2. Place a quarter-sized dollop on your shoulder and rub in thoroughly, making sure to cover the front, back, and side of your shoulder, along with the armpit.   2 days before ____ AM   ____ PM              1 day before ____ AM   ____ PM                         3. Do this twice a day for two days.  (Last application is the night before surgery, AFTER using the CHG soap as described below).  4. Do NOT apply benzoyl peroxide gel on the day of surgery. 5.  [place] - Preparing for Surgery  Before surgery, you can play an important  role.  Because skin is not sterile, your skin needs to be as free of germs as possible.   You can reduce the number of germs on your skin by washing with CHG (chlorahexidine gluconate) soap before surgery.   CHG is an antiseptic cleaner which kills germs and bonds with the skin to continue killing germs even after washing. Please DO NOT use if you have an allergy to CHG or antibacterial soaps.   If your skin becomes reddened/irritated stop using the CHG and inform your nurse when you arrive at Short Stay. Do not shave (including legs and underarms) for at least 48 hours prior to the first CHG shower.   . Please follow these instructions carefully:  1.  Shower with CHG Soap the night before surgery and the  morning of Surgery.  2.  If you choose to wash your hair, wash your hair first as usual with your  normal  shampoo.  3.  After you shampoo, rinse your hair and body thoroughly to remove the  shampoo.                                        4.  Use CHG as you would any other liquid soap.  You can apply chg directly  to the skin and wash                       Gently with a scrungie or clean washcloth.  5.  Apply the CHG Soap to your body ONLY FROM THE NECK DOWN.   Do not use on face/ open                           Wound or open sores. Avoid contact with eyes, ears mouth and genitals (private parts).                       Wash face,  Genitals (private parts) with your normal soap.             6.  Wash thoroughly, paying special attention to the area where your surgery  will be performed.  7.  Thoroughly rinse your body with warm water from the neck down.  8.  DO NOT shower/wash with your normal soap after using and rinsing off  the CHG Soap.             9.  Pat yourself dry with a clean towel.            10.  Wear clean pajamas.            11.  Place clean sheets on your bed the night of your first shower and do not  sleep with pets. Day of Surgery : Do not apply any lotions/deodorants the  morning of surgery.  Please wear clean clothes to the hospital/surgery center.  FAILURE TO FOLLOW THESE INSTRUCTIONS MAY RESULT IN THE CANCELLATION OF YOUR SURGERY PATIENT SIGNATURE_________________________________  NURSE SIGNATURE__________________________________  ________________________________________________________________________   Adam Phenix  An incentive spirometer is a tool that can help keep your lungs clear and active. This tool measures how well you are filling your lungs with each breath. Taking long deep breaths may help reverse or decrease the chance of developing breathing (pulmonary) problems (especially infection) following:  A long period of time when you are unable to  move or be active. BEFORE THE PROCEDURE   If the spirometer includes an indicator to show your best effort, your nurse or respiratory therapist will set it to a desired goal.  If possible, sit up straight or lean slightly forward. Try not to slouch.  Hold the incentive spirometer in an upright position. INSTRUCTIONS FOR USE  1. Sit on the edge of your bed if possible, or sit up as far as you can in bed or on a chair. 2. Hold the incentive spirometer in an upright position. 3. Breathe out normally. 4. Place the mouthpiece in your mouth and seal your lips tightly around it. 5. Breathe in slowly and as deeply as possible, raising the piston or the ball toward the top of the column. 6. Hold your breath for 3-5 seconds or for as long as possible. Allow the piston or ball to fall to the bottom of the column. 7. Remove the mouthpiece from your mouth and breathe out normally. 8. Rest for a few seconds and repeat Steps 1 through 7 at least 10 times every 1-2 hours when you are awake. Take your time and take a few normal breaths between deep breaths. 9. The spirometer may include an indicator to show your best effort. Use the indicator as a goal to work toward during each repetition. 10. After each  set of 10 deep breaths, practice coughing to be sure your lungs are clear. If you have an incision (the cut made at the time of surgery), support your incision when coughing by placing a pillow or rolled up towels firmly against it. Once you are able to get out of bed, walk around indoors and cough well. You may stop using the incentive spirometer when instructed by your caregiver.  RISKS AND COMPLICATIONS  Take your time so you do not get dizzy or light-headed.  If you are in pain, you may need to take or ask for pain medication before doing incentive spirometry. It is harder to take a deep breath if you are having pain. AFTER USE  Rest and breathe slowly and easily.  It can be helpful to keep track of a log of your progress. Your caregiver can provide you with a simple table to help with this. If you are using the spirometer at home, follow these instructions: [place] IF:   You are having difficultly using the spirometer.  You have trouble using the spirometer as often as instructed.  Your pain medication is not giving enough relief while using the spirometer.  You develop fever of 100.5 F (38.1 C) or higher. SEEK IMMEDIATE MEDICAL CARE IF:   You cough up bloody sputum that had not been present before.  You develop fever of 102 F (38.9 C) or greater.  You develop worsening pain at or near the incision site. MAKE SURE YOU:   Understand these instructions.  Will watch your condition.  Will get help right away if you are not doing well or get worse. Document Released: 05/03/2006 Document Revised: 03/15/2011 Document Reviewed: 07/04/2006 [facility] Patient Information 2014 [place], [place].   ________________________________________________________________________

## 2019-10-31 NOTE — Telephone Encounter (Signed)
Please let Ms. Cristina Douglas know that I did review her records from [facility].  I am reassured that her coronary arteries were normal at heart catheterization and her LV function was also normal.  It looks like suspicion was that the episode of VT that she experienced may have been related to right ventricular outflow tract origin which would typically be managed medically, hence the addition of flecainide.  She was also hypokalemic which may have also been contributing.  I am certainly happy to see her in the office, however our schedules have been quite congested since the departure of Dr. Bronson Ing.  If she would like to reschedule her visit to see me, we might be able to get her worked in with a cancellation.

## 2019-11-01 ENCOUNTER — Encounter (HOSPITAL_COMMUNITY)
Admission: RE | Admit: 2019-11-01 | Discharge: 2019-11-01 | Disposition: A | Discharge status: Home / Self Care | Payer: Medicare PPO | Source: Ambulatory Visit | Attending: Family Medicine | Admitting: Family Medicine

## 2019-11-01 ENCOUNTER — Encounter: Payer: Self-pay | Admitting: Cardiology

## 2019-11-01 ENCOUNTER — Other Ambulatory Visit: Payer: Self-pay

## 2019-11-01 ENCOUNTER — Ambulatory Visit: Payer: Medicare PPO | Admitting: Cardiology

## 2019-11-01 VITALS — BP 130/78 | HR 64 | Ht 63.0 in | Wt 168.0 lb

## 2019-11-01 DIAGNOSIS — I472 Ventricular tachycardia: Secondary | ICD-10-CM | POA: Diagnosis not present

## 2019-11-01 DIAGNOSIS — I4729 Other ventricular tachycardia: Secondary | ICD-10-CM

## 2019-11-01 DIAGNOSIS — I10 Essential (primary) hypertension: Secondary | ICD-10-CM | POA: Diagnosis not present

## 2019-11-01 MED ORDER — POTASSIUM CHLORIDE ER 10 MEQ PO TBCR: 10.0000 meq | EXTENDED_RELEASE_TABLET | Freq: Every day | ORAL | 3 refills | Status: DC

## 2019-11-01 NOTE — Progress Notes (Signed)
Cardiology Office Note  Date: 11/01/2019   ID: Cristina Douglas, DOB [birth year], MRN [id number]  PCP:  Curlene Labrum, MD  Cardiologist:  Rozann Lesches, MD Electrophysiologist:  None   Chief Complaint  Patient presents with  . Hospitalization Follow-up    History of Present Illness: Cristina Douglas is an 84 y.o. female former patient of Dr. Bronson Ing that I met recently in the office in early October with a history of PVCs that were managed with beta-blocker therapy over time.  She did not report any progressive symptoms at our visit.  Per chart review I see that she was acutely hospitalized in late October having presented to [facility] with sustained ventricular tachycardia (pulse present but hypotensive), underwent cardioversion, and transferred to [facility] for further evaluation. She underwent a diagnostic cardiac catheterization that demonstrated normal coronaries and LVEF was found to be normal by echocardiogram.  It was suspected that she had RV outflow tract VT and she was started on flecainide.  Electrolytes were also corrected, she had presented hypokalemic.  She was discharged with a cardiac monitor.  I reviewed her cardiac studies from [facility], noted below.  She does not report any palpitations or dizziness since discharge.  Interestingly, she does recall an episode where she felt suddenly lightheaded and flushed back in the summer that reminded her of the event that just occurred, but to lesser degree.  She has never had any frank syncope.  I reviewed her medications which are outlined below.  We did discuss adding a low-dose potassium supplement.  She is on HCTZ for treatment of hypertension per Dr. Pleas Koch.  I also talked with her about referral for EP consultation in case her PVCs and VT remain a recurring issue on medical therapy.   Past Medical History:  Diagnosis Date  . Basal cell carcinoma   . Breast cancer ([place])    DCIS right breast status  post radiation and lumpectomy.  . Cataract   . Diverticulitis   . Diverticulosis   . Essential hypertension   . Hyperlipidemia   . Hypothyroidism   . PVC's (premature ventricular contractions)   . Type 2 diabetes mellitus ([place])   . Ventricular tachycardia ([place])    Suspected RVOT VT, normal coronaries October 2021    Past Surgical History:  Procedure Laterality Date  . APPENDECTOMY    . BREAST BIOPSY Right 04/10/2013  . BREAST LUMPECTOMY Right 05/09/2013  . BREAST LUMPECTOMY WITH RADIOACTIVE SEED LOCALIZATION Right 05/09/2013   Procedure: BREAST LUMPECTOMY WITH RADIOACTIVE SEED LOCALIZATION;  Surgeon: Adin Hector, MD;  Location: [facility];  Service: General;  Laterality: Right;  . COLONOSCOPY    . EYE SURGERY     both cataracts  . TONSILLECTOMY    . TUBAL LIGATION      Current Outpatient Medications  Medication Sig Dispense Refill  . atorvastatin (LIPITOR) 80 MG tablet Take 80 mg by mouth at bedtime.     . Calcium Carbonate-Vitamin D (CALTRATE 600+D) 600-400 MG-UNIT per tablet Take 1 tablet by mouth 2 (two) times daily.     . cholecalciferol (VITAMIN D) 1000 UNITS tablet Take 1,000 Units by mouth daily.      . Coenzyme Q10 (CO Q 10) 100 MG CAPS Take 100 mg by mouth daily.     . flecainide (TAMBOCOR) 50 MG tablet Take 50 mg by mouth 2 (two) times daily.     Marland Kitchen glucosamine-chondroitin 500-400 MG tablet Take 1 tablet by  mouth 2 (two) times daily.     . hydrochlorothiazide (MICROZIDE) 12.5 MG capsule Take 12.5 mg by mouth 2 (two) times daily.    Marland Kitchen levothyroxine (SYNTHROID, LEVOTHROID) 88 MCG tablet Take 88 mcg by mouth daily before breakfast.     . losartan (COZAAR) 50 MG tablet Take 50 mg by mouth 2 (two) times daily.    Marland Kitchen MAGNESIUM OXIDE PO Take 1 tablet by mouth at bedtime.     . metFORMIN (GLUCOPHAGE-XR) 500 MG 24 hr tablet Take 1,000 mg by mouth 2 (two) times daily.    . metoprolol tartrate (LOPRESSOR) 25 MG tablet TAKE 1 TABLET BY MOUTH EVERY DAY (Patient  taking differently: Take 25 mg by mouth daily. ) 90 tablet 1  . Misc Natural Products (TURMERIC CURCUMIN) CAPS Take 1 capsule by mouth daily.     . Multiple Vitamin (MULTIVITAMIN) tablet Take 1 tablet by mouth daily.      . potassium chloride (KLOR-CON) 10 MEQ tablet Take 1 tablet (10 mEq total) by mouth daily. 90 tablet 3   No current facility-administered medications for this visit.   Allergies:  Sulfa antibiotics   ROS: No chest pain or syncope.  Physical Exam: VS:  BP 130/78   Pulse 64   Ht _0  (1.6 m)   Wt 168 lb (76.2 kg)   SpO2 97%   BMI 29.76 kg/m , BMI Body mass index is 29.76 kg/m.  Wt Readings from Last 3 Encounters:  11/01/19 168 lb (76.2 kg)  10/10/19 168 lb (76.2 kg)  06/12/19 169 lb (76.7 kg)    General: Elderly woman, appears comfortable at rest. HEENT: Conjunctiva and lids normal, wearing a mask. Neck: Supple, no elevated JVP or carotid bruits, no thyromegaly. Lungs: Clear to auscultation, nonlabored breathing at rest. Cardiac: Regular rate and rhythm, no S3 or significant systolic murmur, no pericardial rub. Extremities: No pitting edema.  ECG:  An ECG dated 06/12/2019 was personally reviewed today and demonstrated:  Normal sinus rhythm, borderline low voltage in the precordial leads.  Recent Labwork: 06/12/2019: BUN 10; Creatinine, Ser 0.86; Hemoglobin 13.7; Platelets 322; Potassium 3.6; Sodium 136  October 2021: Hemoglobin 11.4, platelets 351, potassium 3.8, BUN 8, creatinine 0.67, peak high-sensitivity troponin I of 6044, TSH 2.047  Other Studies Reviewed Today:  Echocardiogram 10/28/2019 [facility]): Summary  1. Technically difficult study.  2. The left ventricle is normal in size with normal wall thickness.  3. The left ventricular systolic function is normal, LVEF is visually  estimated at > 55%.  4. The right ventricle is normal in size, with normal systolic function.  5. There is mild pulmonary hypertension, estimated pulmonary  artery systolic  pressure is 33 mmHg.    Cardiac catheterization 10/29/2019 [facility]): Hemodynamics and Left Heart Catheterization  Aortic pressure: 131/60 mm Hg (mean 89 mm Hg)  LVEDP = 10 mm Hg Left Ventriculogram  RAO Left Ventriculogram:Normal  Ejection Fraction (visual estimate): 55% Coronary Angiography Dominance: Right Left Main: The left main coronary artery (LMCA) is a large-caliber vessel that originates from the left coronary sinus. It bifurcates into the left anterior descending (LAD) and left circumflex (LCx) arteries. There is no angiographic evidence of significant disease in the LMCA. LAD: The LAD is a large-caliber vessel that gives off 2 diagonal (D) branches before it wraps around the apex. D1 is a large-caliber vessel. D2 is a small-caliber vessel. There is no angiographic evidence of significant disease in the LAD. Left Circumflex: The LCx is a large-caliber  vessel that gives off 3 obtuse marginal (OM) branches and then continues as a small vessel in the AV groove. OM1 is a very small-caliber vessel. OM2 is a very small-caliber vessel. OM3 is a large-caliber trifurcating vessel. There is no angiographic evidence of significant disease in the LCx. Right Coronary: The right coronary artery (RCA) is a large-caliber vessel originating from the right coronary sinus. It bifurcates distally into a moderate-caliber posterior descending artery (PDA) and moderate posterolateral (PL) branches consistent with a right dominant system. There is no angiographic evidence of significant disease in the RCA.  Assessment and Plan:  1.  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  Recent cardiac catheterization showed normal coronary arteries and LVEF is normal.  She was evaluated at [facility], now on flecainide 50 mg twice daily and continues on low-dose Lopressor.  She is wearing a Zio patch, we will review the results when available.  In the meanwhile I will refer her for EP consultation in  case she manifests recurring arrhythmias on medical therapy and needs to be considered for ablation.  2.  Essential hypertension, she is on losartan and HCTZ per Dr. Pleas Koch.  Adding KCl 10 mEq daily to prevent recurring hypokalemia.  Medication Adjustments/Labs and Tests Ordered: Current medicines are reviewed at length with the patient today.  Concerns regarding medicines are outlined above.   Tests Ordered: Orders Placed This Encounter  Procedures  . Ambulatory referral to Cardiac Electrophysiology    Medication Changes: Meds ordered this encounter  Medications  . potassium chloride (KLOR-CON) 10 MEQ tablet    Sig: Take 1 tablet (10 mEq total) by mouth daily.    Dispense:  90 tablet    Refill:  3    Disposition:  Follow up 6 weeks.  Signed, Satira Sark, MD, [facility] 11/01/2019 3:07 PM     [facility] [address], [place], [place] [address] Phone: [phone]; Fax: [phone]

## 2019-11-01 NOTE — Patient Instructions (Signed)
Medication Instructions:  START Potassium 10 meq daily  *If you need a refill on your cardiac medications before your next appointment, please call your pharmacy*   Lab Work: None today  If you have labs (blood work) drawn today and your tests are completely normal, you will receive your results only by: Marland Kitchen MyChart Message (if you have MyChart) OR . A paper copy in the mail If you have any lab test that is abnormal or we need to change your treatment, we will call you to review the results.   Testing/Procedures: None today   Follow-Up: At [facility], you and your health needs are our priority.  As part of our continuing mission to provide you with exceptional heart care, we have created designated Provider Care Teams.  These Care Teams include your primary Cardiologist (physician) and Advanced Practice Providers (APPs -  Physician Assistants and Nurse Practitioners) who all work together to provide you with the care you need, when you need it.  We recommend signing up for the patient portal called "MyChart".  Sign up information is provided on this After Visit Summary.  MyChart is used to connect with patients for Virtual Visits (Telemedicine).  Patients are able to view lab/test results, encounter notes, upcoming appointments, etc.  Non-urgent messages can be sent to your provider as well.   To learn more about what you can do with MyChart, go to [email or web address].    Your next appointment:   6 week(s)  The format for your next appointment:   In Person  Provider:   Rozann Lesches, MD   Other Instructions We referred you to electrophysiology, Dr.Allred.They will call you for an apt.

## 2019-11-05 ENCOUNTER — Telehealth: Payer: Self-pay | Admitting: Cardiology

## 2019-11-05 DIAGNOSIS — E876 Hypokalemia: Secondary | ICD-10-CM | POA: Diagnosis not present

## 2019-11-05 DIAGNOSIS — C50911 Malignant neoplasm of unspecified site of right female breast: Secondary | ICD-10-CM | POA: Diagnosis not present

## 2019-11-05 DIAGNOSIS — I472 Ventricular tachycardia: Secondary | ICD-10-CM | POA: Diagnosis not present

## 2019-11-05 DIAGNOSIS — Z6829 Body mass index (BMI) 29.0-29.9, adult: Secondary | ICD-10-CM | POA: Diagnosis not present

## 2019-11-05 DIAGNOSIS — E039 Hypothyroidism, unspecified: Secondary | ICD-10-CM | POA: Diagnosis not present

## 2019-11-05 NOTE — Telephone Encounter (Signed)
Dr Guadalupe Maple office made aware

## 2019-11-05 NOTE — Telephone Encounter (Signed)
Cristina Douglas w/ Dr. Garrel Ridgel office called to see if the pt is cleared to have her regular cleaning apt this morning  Pt is currently in the office now.   Please call [phone]

## 2019-11-05 NOTE — Telephone Encounter (Signed)
Yes, she can have a routine teeth cleaning.

## 2019-11-06 ENCOUNTER — Telehealth: Payer: Self-pay | Admitting: Cardiology

## 2019-11-06 ENCOUNTER — Encounter: Payer: Self-pay | Admitting: *Deleted

## 2019-11-06 NOTE — Telephone Encounter (Signed)
Advised that lab work has been requested and will be routed to provider for review

## 2019-11-06 NOTE — Telephone Encounter (Signed)
Patient called stating that she had recent labs with Dr. Pleas Koch. She is wanting Dr. Domenic Polite to look at the labs.

## 2019-11-06 NOTE — Telephone Encounter (Signed)
Patient informed and verbalized understanding of plan. Says she would keep visit on 01/14/2019

## 2019-11-08 ENCOUNTER — Ambulatory Visit (HOSPITAL_COMMUNITY): Admission: RE | Admit: 2019-11-08 | Payer: Medicare PPO | Source: Home / Self Care | Admitting: Orthopedic Surgery

## 2019-11-08 ENCOUNTER — Encounter (HOSPITAL_COMMUNITY): Admission: RE | Payer: Self-pay | Source: Home / Self Care

## 2019-11-08 SURGERY — ARTHROPLASTY, SHOULDER, TOTAL, REVERSE
Anesthesia: General | Site: Shoulder | Laterality: Right

## 2019-11-09 ENCOUNTER — Telehealth: Payer: Self-pay | Admitting: *Deleted

## 2019-11-09 NOTE — Telephone Encounter (Signed)
Patient informed and verbalized understanding of plan. 

## 2019-11-09 NOTE — Telephone Encounter (Signed)
-----   Message from Satira Sark, MD sent at 11/07/2019 10:55 AM EDT ----- Results reviewed.  Received lab work from Dr. Pleas Koch.  Renal function normal with creatinine 0.77 and her potassium is also normal at 4.3.  Would continue supplements as we have already started.  Magnesium normal range at 1.9.

## 2019-11-12 ENCOUNTER — Encounter: Payer: Self-pay | Admitting: Internal Medicine

## 2019-11-12 ENCOUNTER — Other Ambulatory Visit: Payer: Self-pay

## 2019-11-12 ENCOUNTER — Ambulatory Visit: Payer: Medicare PPO | Admitting: Internal Medicine

## 2019-11-12 VITALS — BP 110/70 | HR 59 | Ht 63.0 in | Wt 168.4 lb

## 2019-11-12 DIAGNOSIS — I472 Ventricular tachycardia: Secondary | ICD-10-CM

## 2019-11-12 DIAGNOSIS — I10 Essential (primary) hypertension: Secondary | ICD-10-CM

## 2019-11-12 DIAGNOSIS — I493 Ventricular premature depolarization: Secondary | ICD-10-CM | POA: Diagnosis not present

## 2019-11-12 DIAGNOSIS — I4729 Other ventricular tachycardia: Secondary | ICD-10-CM

## 2019-11-12 MED ORDER — HYDROCHLOROTHIAZIDE 12.5 MG PO CAPS: 12.5000 mg | ORAL_CAPSULE | Freq: Every day | ORAL | 3 refills | Status: DC

## 2019-11-12 NOTE — Progress Notes (Signed)
Electrophysiology Office Note   Date:  11/12/2019   ID:  Cristina Douglas, DOB [birth year], MRN [id number]  PCP:  Curlene Labrum, MD  Cardiologist:  Dr Domenic Polite Primary Electrophysiologist: Thompson Grayer, MD    CC: tachycardia    History of Present Illness: Cristina Douglas is a 84 y.o. female who presents today for electrophysiology evaluation.   She is referred by Dr Domenic Polite for EP consultation regarding wide complex tachycardia.  She has been followed by Dr Dannielle Burn and subsequently [facility] for years regarding palpitations and PVCs.  These have been well controlled with metoprolol. She reports that on 10/27/19 she was at a dinner party.  She had several sips of homemade wine and then developed tachypalpitations with dizziness and chest discomfort.  She presented to [facility] and was observed to have hemodynamically stable wide complex tachycardia.  I do not have strips to review however this was described as a LBB tachycardia in care everywhere (personally reviewed).  She was shocked while awake several times and then transferred to [facility]. Cath and echo were felt to be low risk.  She was felt to have outflow tract tachycardia and was placed on flecainide.  She has done well since without any further symptoms. She has occasional dizziness related to low BP.  This past summer in the heat, she had presyncope.  She is chronically on hctz and has recently been started on KDur for low K.  Today, she denies symptoms of palpitations, chest pain, shortness of breath, orthopnea, PND, lower extremity edema, claudication, dizziness, presyncope, syncope, bleeding, or neurologic sequela. The patient is tolerating medications without difficulties and is otherwise without complaint today.    Past Medical History:  Diagnosis Date  . Basal cell carcinoma   . Breast cancer ([place])    DCIS right breast status post radiation and lumpectomy.  . Cataract   . Diverticulitis   .  Diverticulosis   . Essential hypertension   . Hyperlipidemia   . Hypothyroidism   . PVC's (premature ventricular contractions)   . Type 2 diabetes mellitus ([place])   . Ventricular tachycardia ([place])    Suspected RVOT VT, normal coronaries October 2021   Past Surgical History:  Procedure Laterality Date  . APPENDECTOMY    . BREAST BIOPSY Right 04/10/2013  . BREAST LUMPECTOMY Right 05/09/2013  . BREAST LUMPECTOMY WITH RADIOACTIVE SEED LOCALIZATION Right 05/09/2013   Procedure: BREAST LUMPECTOMY WITH RADIOACTIVE SEED LOCALIZATION;  Surgeon: Adin Hector, MD;  Location: [facility];  Service: General;  Laterality: Right;  . COLONOSCOPY    . EYE SURGERY     both cataracts  . TONSILLECTOMY    . TUBAL LIGATION       Current Outpatient Medications  Medication Sig Dispense Refill  . atorvastatin (LIPITOR) 80 MG tablet Take 80 mg by mouth at bedtime.     . Calcium Carbonate-Vitamin D (CALTRATE 600+D) 600-400 MG-UNIT per tablet Take 1 tablet by mouth 2 (two) times daily.     . cholecalciferol (VITAMIN D) 1000 UNITS tablet Take 1,000 Units by mouth daily.      . Coenzyme Q10 (CO Q 10) 100 MG CAPS Take 100 mg by mouth daily.     . flecainide (TAMBOCOR) 50 MG tablet Take 50 mg by mouth 2 (two) times daily.     Marland Kitchen glucosamine-chondroitin 500-400 MG tablet Take 1 tablet by mouth 2 (two) times daily.     Marland Kitchen levothyroxine (SYNTHROID, LEVOTHROID) 88 MCG tablet Take 88  mcg by mouth daily before breakfast.     . losartan (COZAAR) 50 MG tablet Take 50 mg by mouth 2 (two) times daily.    Marland Kitchen MAGNESIUM OXIDE PO Take 1 tablet by mouth at bedtime.     . metFORMIN (GLUCOPHAGE-XR) 500 MG 24 hr tablet Take 1,000 mg by mouth 2 (two) times daily.    . metoprolol tartrate (LOPRESSOR) 25 MG tablet TAKE 1 TABLET BY MOUTH EVERY DAY (Patient taking differently: Take 25 mg by mouth daily. ) 90 tablet 1  . Misc Natural Products (TURMERIC CURCUMIN) CAPS Take 1 capsule by mouth daily.     . Multiple Vitamin  (MULTIVITAMIN) tablet Take 1 tablet by mouth daily.      . potassium chloride (KLOR-CON) 10 MEQ tablet Take 1 tablet (10 mEq total) by mouth daily. 90 tablet 3  . hydrochlorothiazide (MICROZIDE) 12.5 MG capsule Take 1 capsule (12.5 mg total) by mouth daily. 90 capsule 3   No current facility-administered medications for this visit.    Allergies:   Sulfa antibiotics   Social History:  The patient  reports that she has never smoked. She has never used smokeless tobacco. She reports current alcohol use. She reports that she does not use drugs.   Family History:  The patient's family history includes Lung cancer in her paternal aunt.  Denies FH of arrhythmias, syncope or sudden death.  ROS:  Please see the history of present illness.   All other systems are personally reviewed and negative.    PHYSICAL EXAM: VS:  BP 110/70   Pulse (!) 59   Ht 5\' 3"  (1.6 m)   Wt 168 lb 6.4 oz (76.4 kg)   SpO2 94%   BMI 29.83 kg/m  , BMI Body mass index is 29.83 kg/m. GEN: Well nourished, well developed, in no acute distress HEENT: normal Neck: no JVD, carotid bruits, or masses Cardiac: RRR; no murmurs, rubs, or gallops,no edema  Respiratory:  clear to auscultation bilaterally, normal work of breathing GI: soft, nontender, nondistended, + BS MS: no deformity or atrophy Skin: warm and dry  Neuro:  Strength and sensation are intact Psych: euthymic mood, full affect  EKG:  EKG is ordered today. The ekg ordered today is personally reviewed and shows sinus rhythm 59 bpm, PR 176 msec, QRS 78 msec, QTc 439 msec   Recent Labs: 06/12/2019: BUN 10; Creatinine, Ser 0.86; Hemoglobin 13.7; Platelets 322; Potassium 3.6; Sodium 136  personally reviewed   Lipid Panel  No results found for: CHOL, TRIG, HDL, CHOLHDL, VLDL, LDLCALC, LDLDIRECT personally reviewed   Wt Readings from Last 3 Encounters:  11/12/19 168 lb 6.4 oz (76.4 kg)  11/01/19 168 lb (76.2 kg)  10/10/19 168 lb (76.2 kg)      Other studies  personally reviewed: Additional studies/ records that were reviewed today include: Dr Chuck Hint notes, prior cath, echo from [place] of the above records today demonstrates: as above   ASSESSMENT AND PLAN:  1.  Outflow tract VT Recently found to have symptomatic but hemodynamically stable wide complex tachycardia I do not have strips of this arrhythmia to review.  It was described in care everywhere as a wide complex tachycardia of LBBB morphology. I will ask our medical records team to request actual strips of her arrhythmia. She had normal cath and low risk echo. She has been placed on flecainide without recurrence. We discussed ablation today as an option should her arrhythmia progress.  Given her advanced age, I would avoid this  currently  2. HTN bp is occasionally low She also has low K at times We will reduce hctz to 12.5mg  daily (from bid)  3. HL On lipitor  Risks, benefits and potential toxicities for medications prescribed and/or refilled reviewed with patient today.    Follow-up:  With Dr Domenic Polite as scheduled in January  Follow-up with EP PA in 6 months I will see in a year  Current medicines are reviewed at length with the patient today.   The patient does not have concerns regarding her medicines.  The following changes were made today:  none  Labs/ tests ordered today include:  Orders Placed This Encounter  Procedures  . EKG 12-Lead     Signed, Thompson Grayer, MD  11/12/2019 9:41 AM     [facility] [address] [place] [address] [phone] (office) [phone] (fax)

## 2019-11-12 NOTE — Patient Instructions (Addendum)
Medication Instructions:  Reduce your Hydrochlorothiazide 12.5 mg to one time daily.  *If you need a refill on your cardiac medications before your next appointment, please call your pharmacy*  Lab Work: None ordered.  If you have labs (blood work) drawn today and your tests are completely normal, you will receive your results only by: Marland Kitchen MyChart Message (if you have MyChart) OR . A paper copy in the mail If you have any lab test that is abnormal or we need to change your treatment, we will call you to review the results.  Testing/Procedures: None ordered.  Follow-Up: At [facility], you and your health needs are our priority.  As part of our continuing mission to provide you with exceptional heart care, we have created designated Provider Care Teams.  These Care Teams include your primary Cardiologist (physician) and Advanced Practice Providers (APPs -  Physician Assistants and Nurse Practitioners) who all work together to provide you with the care you need, when you need it.  We recommend signing up for the patient portal called "MyChart".  Sign up information is provided on this After Visit Summary.  MyChart is used to connect with patients for Virtual Visits (Telemedicine).  Patients are able to view lab/test results, encounter notes, upcoming appointments, etc.  Non-urgent messages can be sent to your provider as well.   To learn more about what you can do with MyChart, go to [email or web address].    Your next appointment:   Your physician wants you to follow-up in: 6 months with Cristina Douglas and 1 year with Cristina Douglas. You will receive a reminder letter in the mail two months in advance. If you don't receive a letter, please call our office to schedule the follow-up appointment.    Other Instructions:

## 2019-11-14 ENCOUNTER — Encounter (HOSPITAL_COMMUNITY): Payer: Self-pay

## 2019-11-14 ENCOUNTER — Encounter (HOSPITAL_COMMUNITY)
Admission: EM | Disposition: A | Discharge status: Home / Self Care | Payer: Self-pay | Source: Home / Self Care | Attending: Emergency Medicine

## 2019-11-14 ENCOUNTER — Emergency Department (HOSPITAL_COMMUNITY): Payer: Medicare PPO

## 2019-11-14 ENCOUNTER — Other Ambulatory Visit: Payer: Self-pay

## 2019-11-14 ENCOUNTER — Other Ambulatory Visit: Payer: Medicare PPO

## 2019-11-14 ENCOUNTER — Emergency Department (HOSPITAL_COMMUNITY)
Admission: EM | Admit: 2019-11-14 | Discharge: 2019-11-14 | Disposition: A | Discharge status: Home / Self Care | Payer: Medicare PPO | Attending: Emergency Medicine | Admitting: Emergency Medicine

## 2019-11-14 DIAGNOSIS — K222 Esophageal obstruction: Secondary | ICD-10-CM | POA: Diagnosis not present

## 2019-11-14 DIAGNOSIS — R079 Chest pain, unspecified: Secondary | ICD-10-CM | POA: Diagnosis not present

## 2019-11-14 DIAGNOSIS — Z7984 Long term (current) use of oral hypoglycemic drugs: Secondary | ICD-10-CM | POA: Insufficient documentation

## 2019-11-14 DIAGNOSIS — Y999 Unspecified external cause status: Secondary | ICD-10-CM | POA: Diagnosis not present

## 2019-11-14 DIAGNOSIS — R1310 Dysphagia, unspecified: Secondary | ICD-10-CM | POA: Insufficient documentation

## 2019-11-14 DIAGNOSIS — T18198A Other foreign object in esophagus causing other injury, initial encounter: Secondary | ICD-10-CM | POA: Diagnosis present

## 2019-11-14 DIAGNOSIS — E119 Type 2 diabetes mellitus without complications: Secondary | ICD-10-CM | POA: Insufficient documentation

## 2019-11-14 DIAGNOSIS — K209 Esophagitis, unspecified without bleeding: Secondary | ICD-10-CM | POA: Diagnosis not present

## 2019-11-14 DIAGNOSIS — Z20822 Contact with and (suspected) exposure to covid-19: Secondary | ICD-10-CM | POA: Insufficient documentation

## 2019-11-14 DIAGNOSIS — X58XXXA Exposure to other specified factors, initial encounter: Secondary | ICD-10-CM | POA: Insufficient documentation

## 2019-11-14 DIAGNOSIS — T503X5A Adverse effect of electrolytic, caloric and water-balance agents, initial encounter: Secondary | ICD-10-CM

## 2019-11-14 DIAGNOSIS — E039 Hypothyroidism, unspecified: Secondary | ICD-10-CM | POA: Insufficient documentation

## 2019-11-14 DIAGNOSIS — K208 Other esophagitis without bleeding: Secondary | ICD-10-CM

## 2019-11-14 DIAGNOSIS — Z853 Personal history of malignant neoplasm of breast: Secondary | ICD-10-CM | POA: Diagnosis not present

## 2019-11-14 DIAGNOSIS — K2100 Gastro-esophageal reflux disease with esophagitis, without bleeding: Secondary | ICD-10-CM | POA: Diagnosis not present

## 2019-11-14 DIAGNOSIS — Y9389 Activity, other specified: Secondary | ICD-10-CM | POA: Diagnosis not present

## 2019-11-14 DIAGNOSIS — I10 Essential (primary) hypertension: Secondary | ICD-10-CM | POA: Diagnosis not present

## 2019-11-14 DIAGNOSIS — R0789 Other chest pain: Secondary | ICD-10-CM | POA: Insufficient documentation

## 2019-11-14 DIAGNOSIS — K449 Diaphragmatic hernia without obstruction or gangrene: Secondary | ICD-10-CM | POA: Diagnosis not present

## 2019-11-14 DIAGNOSIS — Z79899 Other long term (current) drug therapy: Secondary | ICD-10-CM | POA: Diagnosis not present

## 2019-11-14 DIAGNOSIS — Y9289 Other specified places as the place of occurrence of the external cause: Secondary | ICD-10-CM | POA: Diagnosis not present

## 2019-11-14 HISTORY — PX: ESOPHAGOGASTRODUODENOSCOPY: SHX5428

## 2019-11-14 HISTORY — PX: FOREIGN BODY REMOVAL: SHX962

## 2019-11-14 LAB — CBC WITH DIFFERENTIAL/PLATELET
Abs Immature Granulocytes: 0.03 10*3/uL (ref 0.00–0.07)
Basophils Absolute: 0 10*3/uL (ref 0.0–0.1)
Basophils Relative: 0 %
Eosinophils Absolute: 0.2 10*3/uL (ref 0.0–0.5)
Eosinophils Relative: 2 %
HCT: 39.3 % (ref 36.0–46.0)
Hemoglobin: 12.5 g/dL (ref 12.0–15.0)
Immature Granulocytes: 0 %
Lymphocytes Relative: 15 %
Lymphs Abs: 1.3 10*3/uL (ref 0.7–4.0)
MCH: 27.9 pg (ref 26.0–34.0)
MCHC: 31.8 g/dL (ref 30.0–36.0)
MCV: 87.7 fL (ref 80.0–100.0)
Monocytes Absolute: 0.9 10*3/uL (ref 0.1–1.0)
Monocytes Relative: 10 %
Neutro Abs: 6.1 10*3/uL (ref 1.7–7.7)
Neutrophils Relative %: 73 %
Platelets: 316 10*3/uL (ref 150–400)
RBC: 4.48 MIL/uL (ref 3.87–5.11)
RDW: 13.6 % (ref 11.5–15.5)
WBC: 8.5 10*3/uL (ref 4.0–10.5)
nRBC: 0 % (ref 0.0–0.2)

## 2019-11-14 LAB — BASIC METABOLIC PANEL
Anion gap: 10 (ref 5–15)
BUN: 10 mg/dL (ref 8–23)
CO2: 25 mmol/L (ref 22–32)
Calcium: 9.4 mg/dL (ref 8.9–10.3)
Chloride: 103 mmol/L (ref 98–111)
Creatinine, Ser: 0.8 mg/dL (ref 0.44–1.00)
GFR, Estimated: >60 mL/min (ref >60)
Glucose, Bld: 107 mg/dL — ABNORMAL HIGH (ref 70–99)
Potassium: 3.9 mmol/L (ref 3.5–5.1)
Sodium: 138 mmol/L (ref 135–145)

## 2019-11-14 LAB — MAGNESIUM: Magnesium: 2 mg/dL (ref 1.7–2.4)

## 2019-11-14 LAB — CBG MONITORING, ED: Glucose-Capillary: 120 mg/dL — ABNORMAL HIGH (ref 70–99)

## 2019-11-14 LAB — RESPIRATORY PANEL BY RT PCR (FLU A&B, COVID)
Influenza A by PCR: NEGATIVE
Influenza B by PCR: NEGATIVE
SARS Coronavirus 2 by RT PCR: NEGATIVE

## 2019-11-14 SURGERY — EGD (ESOPHAGOGASTRODUODENOSCOPY)
Anesthesia: Moderate Sedation

## 2019-11-14 MED ORDER — FENTANYL CITRATE (PF) 100 MCG/2ML IJ SOLN
INTRAMUSCULAR | Status: AC
  Filled 2019-11-14: qty 4

## 2019-11-14 MED ORDER — MIDAZOLAM HCL (PF) 5 MG/ML IJ SOLN
INTRAMUSCULAR | Status: AC
  Filled 2019-11-14: qty 2

## 2019-11-14 MED ORDER — MIDAZOLAM HCL (PF) 10 MG/2ML IJ SOLN
INTRAMUSCULAR | Status: DC | PRN
  Administered 2019-11-14 (×2): 2 mg via INTRAVENOUS

## 2019-11-14 MED ORDER — FENTANYL CITRATE (PF) 100 MCG/2ML IJ SOLN
INTRAMUSCULAR | Status: DC | PRN
Start: 2019-11-14 — End: 2019-11-14
  Administered 2019-11-14: 25 ug via INTRAVENOUS

## 2019-11-14 MED ORDER — GLUCAGON HCL RDNA (DIAGNOSTIC) 1 MG IJ SOLR
2.0000 mg | Freq: Once | INTRAMUSCULAR | Status: AC
  Administered 2019-11-14: 2 mg via INTRAVENOUS
  Filled 2019-11-14: qty 2

## 2019-11-14 MED ORDER — MIDAZOLAM HCL 5 MG/5ML IJ SOLN
INTRAMUSCULAR | Status: DC | PRN
  Administered 2019-11-14: 1 mg via INTRAVENOUS

## 2019-11-14 MED ORDER — FENTANYL CITRATE (PF) 100 MCG/2ML IJ SOLN
INTRAMUSCULAR | Status: DC | PRN
  Administered 2019-11-14 (×2): 25 ug via INTRAVENOUS

## 2019-11-14 MED ORDER — NITROGLYCERIN 0.4 MG SL SUBL
0.4000 mg | SUBLINGUAL_TABLET | Freq: Once | SUBLINGUAL | Status: AC
  Administered 2019-11-14: 0.4 mg via SUBLINGUAL
  Filled 2019-11-14: qty 1

## 2019-11-14 NOTE — ED Provider Notes (Signed)
[facility] EMERGENCY DEPARTMENT Provider Note   CSN: [id number] Arrival date & time: 11/14/19  1327     History Chief Complaint  Patient presents with  . Dysphagia    Pill stuck    Cristina Douglas is a 84 y.o. female.  Cristina Douglas is a 84 y.o. female with a history of hypertension, diabetes, hyperlipidemia, ventricular tachycardia, hypothyroidism, breast cancer, who presents to the emergency department with swallowing difficulties and sensation that a pill is stuck in her throat.  Patient states that she was recently started on potassium by her cardiologist after she had an episode of ventricular tachycardia that was thought to be caused by potassium of 2.4.  She states that she has had a lot of difficulty swallowing his potassium tablets and reports that daily has been a struggle to get the tablets down.  She states that today around 10:15 she tried to take her potassium tablet but felt like it had gotten stuck.  She states that about 2 hours prior to this she ate a waffle for breakfast and took all of her other medications with no difficulty swallowing.  Ever since she took the potassium tablet she has felt like it has been stuck in her mid upper chest she reports a constant pressure and discomfort in this area.  She states since then she has not been able to keep down her saliva or secretions and has been spitting them into a cup.  She states that she tried to drink water as well as warm coffee to dissolve the tablet but all of these things have come back up.  She states that has now been 4 hours with no relief.  She denies prior history of food bolus or esophageal obstruction.  States that once a few years ago she had an episode where it felt like a carrot got stuck in her esophagus, and they were on their way to the hospital when it seemed to pass.  She states she has previously been seen by Dr. Collene Mares with GI.        Past Medical History:  Diagnosis Date  .  Basal cell carcinoma   . Breast cancer ([place])    DCIS right breast status post radiation and lumpectomy.  . Cataract   . Diverticulitis   . Diverticulosis   . Essential hypertension   . Hyperlipidemia   . Hypothyroidism   . PVC's (premature ventricular contractions)   . Type 2 diabetes mellitus ([place])   . Ventricular tachycardia ([place])    Suspected RVOT VT, normal coronaries October 2021    Patient Active Problem List   Diagnosis Date Noted  . Breast cancer of lower-outer quadrant of right female breast ([place]) 04/12/2013  . Palpitations 09/01/2010  . Orthostatic hypotension 09/01/2010  . HYPERLIPIDEMIA-MIXED 07/25/2008  . MITRAL REGURGITATION, 0 (MILD) 07/25/2008  . Essential hypertension 07/25/2008    Past Surgical History:  Procedure Laterality Date  . APPENDECTOMY    . BREAST BIOPSY Right 04/10/2013  . BREAST LUMPECTOMY Right 05/09/2013  . BREAST LUMPECTOMY WITH RADIOACTIVE SEED LOCALIZATION Right 05/09/2013   Procedure: BREAST LUMPECTOMY WITH RADIOACTIVE SEED LOCALIZATION;  Surgeon: Adin Hector, MD;  Location: [facility];  Service: General;  Laterality: Right;  . COLONOSCOPY    . EYE SURGERY     both cataracts  . TONSILLECTOMY    . TUBAL LIGATION       OB History   No obstetric history on file.  Family History  Problem Relation Age of Onset  . Lung cancer Paternal Aunt     Social History   Tobacco Use  . Smoking status: Never Smoker  . Smokeless tobacco: Never Used  Vaping Use  . Vaping Use: Never used  Substance Use Topics  . Alcohol use: Yes    Comment: occas- 10/2/15Rarely drinks wine - AJ  . Drug use: No    Home Medications Prior to Admission medications   Medication Sig Start Date End Date Taking? Authorizing Provider  atorvastatin (LIPITOR) 80 MG tablet Take 80 mg by mouth at bedtime.  08/20/10  Yes [provider]  Calcium Carbonate-Vitamin D (CALTRATE 600+D) 600-400 MG-UNIT per tablet Take 1 tablet by mouth 2 (two)  times daily.    Yes [provider]  cholecalciferol (VITAMIN D) 1000 UNITS tablet Take 1,000 Units by mouth daily.     Yes [provider]  Coenzyme Q10 (CO Q 10) 100 MG CAPS Take 100 mg by mouth daily.    Yes [provider]  flecainide (TAMBOCOR) 50 MG tablet Take 50 mg by mouth 2 (two) times daily.  10/29/19 12/28/19 Yes [provider]  glucosamine-chondroitin 500-400 MG tablet Take 1 tablet by mouth 2 (two) times daily.    Yes [provider]  hydrochlorothiazide (MICROZIDE) 12.5 MG capsule Take 1 capsule (12.5 mg total) by mouth daily. 11/12/19  Yes Allred, Jeneen Rinks, MD  levothyroxine (SYNTHROID, LEVOTHROID) 88 MCG tablet Take 88 mcg by mouth daily before breakfast.    Yes [provider]  losartan (COZAAR) 50 MG tablet Take 50 mg by mouth 2 (two) times daily. 09/06/19  Yes [provider]  MAGNESIUM OXIDE PO Take 1 tablet by mouth at bedtime.    Yes [provider]  metFORMIN (GLUCOPHAGE-XR) 500 MG 24 hr tablet Take 1,000 mg by mouth 2 (two) times daily. 10/26/19  Yes [provider]  metoprolol tartrate (LOPRESSOR) 25 MG tablet TAKE 1 TABLET BY MOUTH EVERY DAY Patient taking differently: Take 25 mg by mouth daily.  09/03/19  Yes Satira Sark, MD  Misc Natural Products (TURMERIC CURCUMIN) CAPS Take 1 capsule by mouth daily.    Yes [provider]  Multiple Vitamin (MULTIVITAMIN) tablet Take 1 tablet by mouth daily.     Yes [provider]  potassium chloride (KLOR-CON) 10 MEQ tablet Take 1 tablet (10 mEq total) by mouth daily. 11/01/19 01/30/20 Yes Satira Sark, MD    Allergies    Sulfa antibiotics  Review of Systems   Review of Systems  Constitutional: Negative for chills and fatigue.  HENT: Positive for trouble swallowing. Negative for sore throat.   Respiratory: Negative for cough and shortness of breath.   Cardiovascular: Negative for chest pain.  Gastrointestinal: Positive for  vomiting. Negative for abdominal pain.  Skin: Negative for color change and rash.  Neurological: Negative for dizziness, speech difficulty, light-headedness and headaches.  All other systems reviewed and are negative.   Physical Exam Updated Vital Signs BP (!) 107/52 (BP Location: Right Arm)   Pulse (!) 56   Temp 98.1 F (36.7 C) (Oral)   Resp 18   Ht 5\' 3"  (1.6 m)   Wt 75.3 kg   SpO2 94%   BMI 29.41 kg/m   Physical Exam Vitals and nursing note reviewed.  Constitutional:      General: She is not in acute distress.    Appearance: Normal appearance. She is well-developed. She is ill-appearing. She is not diaphoretic.  Comments: Patient is alert, but appears uncomfortable, spitting secretions into a cup  HENT:     Head: Normocephalic and atraumatic.     Mouth/Throat:     Comments: Mucous membranes moist, posterior oropharynx is clear, no visible pill, patient with normal phonation, but is spitting secretions into a cup.  No trismus Eyes:     General:        Right eye: No discharge.        Left eye: No discharge.     Pupils: Pupils are equal, round, and reactive to light.  Neck:     Comments: No stridor Cardiovascular:     Rate and Rhythm: Normal rate and regular rhythm.     Heart sounds: Normal heart sounds. No murmur heard.  No friction rub. No gallop.   Pulmonary:     Effort: Pulmonary effort is normal. No respiratory distress.     Breath sounds: Normal breath sounds. No wheezing or rales.     Comments: Respirations equal and unlabored, patient able to speak in full sentences, lungs clear to auscultation bilaterally, chest wall is nontender, no crepitus Abdominal:     General: Bowel sounds are normal. There is no distension.     Palpations: Abdomen is soft. There is no mass.     Tenderness: There is no abdominal tenderness. There is no guarding.     Comments: Abdomen soft, nondistended, nontender to palpation in all quadrants without guarding or peritoneal signs     Musculoskeletal:        General: No deformity.     Cervical back: Neck supple.  Skin:    General: Skin is warm and dry.     Capillary Refill: Capillary refill takes less than 2 seconds.  Neurological:     Mental Status: She is alert and oriented to person, place, and time.     Coordination: Coordination normal.     Comments: Speech is clear, able to follow commands Moves extremities without ataxia, coordination intact  Psychiatric:        Mood and Affect: Mood normal.        Behavior: Behavior normal.     ED Results / Procedures / Treatments   Labs (all labs ordered are listed, but only abnormal results are displayed) Labs Reviewed  BASIC METABOLIC PANEL - Abnormal; Notable for the following components:      Result Value   Glucose, Bld 107 (*)    All other components within normal limits  CBG MONITORING, ED - Abnormal; Notable for the following components:   Glucose-Capillary 120 (*)    All other components within normal limits  RESPIRATORY PANEL BY RT PCR (FLU A&B, COVID)  CBC WITH DIFFERENTIAL/PLATELET  MAGNESIUM    EKG EKG Interpretation  Date/Time:  Wednesday November 14 2019 14:55:59 EST Ventricular Rate:  63 PR Interval:    QRS Duration: 90 QT Interval:  447 QTC Calculation: 458 R Axis:   9 Text Interpretation: Sinus rhythm Probable anteroseptal infarct, old Confirmed by Gerlene Fee [phone]) on 11/14/2019 3:18:16 PM   Radiology DG Chest Port 1 View  Result Date: 11/14/2019 CLINICAL DATA:  Possible impacted ingested pill, difficulty swallowing, mid chest pain and pressure EXAM: PORTABLE CHEST 1 VIEW COMPARISON:  10/27/2019 FINDINGS: Single frontal view of the chest demonstrates an unremarkable cardiac silhouette. No airspace disease, effusion, or pneumothorax. No acute bony abnormalities. No radiopaque foreign bodies. Stable degenerative changes of the shoulders. IMPRESSION: 1. No acute intrathoracic process. No radiopaque foreign body identified.  Electronically Signed  By: Randa Ngo M.D.   On: 11/14/2019 15:00    Procedures Procedures (including critical care time)  Medications Ordered in ED Medications  nitroGLYCERIN (NITROSTAT) SL tablet 0.4 mg (0.4 mg Sublingual Given 11/14/19 1503)  glucagon (human recombinant) (GLUCAGEN) injection 2 mg (2 mg Intravenous Given 11/14/19 1503)    ED Course  I have reviewed the triage vital signs and the nursing notes.  Pertinent labs & imaging results that were available during my care of the patient were reviewed by me and considered in my medical decision making (see chart for details).    MDM Rules/Calculators/A&P                         84 year old female presents with sensation that potassium tablet is stuck in her esophagus.  Since she took it at 1015 she has not been able to swallow her secretions or keep anything down.  She attempted to drink water and warm coffee to dissolve the pill, but threw all of these up.  On my evaluation, patient appears uncomfortable, she is gurgling with her saliva and spitting this into a cup.  She reports a constant discomfort in the mid upper chest where she feels like the pill is stuck.  Denies any associated shortness of breath.  Patient is not in acute distress.  Doubt Boerhaave syndrome at this time but will check lab work, EKG and chest x-ray.  Will give glucagon and nitro to try and help he will pass, and will consult GI.  Case discussed with Dr. Benson Norway who is apprised and would have expected the tablet to dissolve, but agrees the symptoms sound obstructive, agrees with plan to try glucagon and nitro first, but if this does not work he requested a call back and will need to come in and do endoscopy.  EKG shows sinus rhythm with no concerning changes when compared to previous  Chest x-ray with no evidence of free air to suggest Boerhaave syndrome, no other acute cardiopulmonary disease.  Labs show no hypokalemia or other significant electrolyte  derangements, no leukocytosis, normal hemoglobin.  Covid swab is negative as well.  After glucagon and no sugar were given patient did not have any improvement in her symptoms.  Attempted to have patient drink water and after taking just 2 sips she immediately spit it back up.  Patient is still having symptoms suggestive of esophageal obstruction.  Will reconsult GI and patient will need endoscopy.  Case discussed with Dr. Benson Norway again who will assemble endoscopy team and begin to evaluate the patient.  Dr. Benson Norway performed bedside endoscopy, patient had 2 esophageal strictures noted, and potassium tablet was lodged in the esophagus at 1.  This was able to be pushed through into the stomach, there were signs of some surrounding irritation and inflammation from the pill, but no signs of perforation.  No other significant abnormalities noted.  Patient will eventually need esophageal dilatation, but not until inflammation improves.  He called the patient in a powder form of potassium supplementation to avoid recurrence of the symptoms.  Will have the patient follow-up with Dr. Benson Norway in the office.  After sedation I reevaluated patient she is alert, well-appearing, tolerated procedure very well, is now tolerating her secretions and p.o. fluids and feels much better.  Dr. Benson Norway did not provide any further instructions or other new medications.  At this time patient is stable for discharge home with outpatient follow-up as discussed.  Final Clinical Impression(s) /  ED Diagnoses Final diagnoses:  Esophageal obstruction  Pill esophagitis due to potassium chloride  Esophageal stricture    Rx / DC Orders ED Discharge Orders    None       Jacqlyn Larsen, [place] 11/15/19 0123    Maudie Flakes, MD 11/16/19 [phone]

## 2019-11-14 NOTE — Consult Note (Signed)
Reason for Consult: Dysphagia Referring Physician: ER  Kaitlan Bin Kham HPI: This is an 84 year old female with a PMH of diverticulitis, hyperlipidemia, and DM who presented to the ER with complaints of dysphagia.  She started to experience acute dysphagia when she was taking one of her potassium pills.  She failed glucagon and she was has a sensation that the pill is still in her esophagus.  Prior to this time she denies any issues with dysphagia.  Her potassium was started one week ago at [facility] with findings of hypokalemia.  In the ER she was not able to tolerate any of her secretions and she continued to vomit with any liquid PO intake.  Past Medical History:  Diagnosis Date  . Basal cell carcinoma   . Breast cancer ([place])    DCIS right breast status post radiation and lumpectomy.  . Cataract   . Diverticulitis   . Diverticulosis   . Essential hypertension   . Hyperlipidemia   . Hypothyroidism   . PVC's (premature ventricular contractions)   . Type 2 diabetes mellitus ([place])   . Ventricular tachycardia ([place])    Suspected RVOT VT, normal coronaries October 2021    Past Surgical History:  Procedure Laterality Date  . APPENDECTOMY    . BREAST BIOPSY Right 04/10/2013  . BREAST LUMPECTOMY Right 05/09/2013  . BREAST LUMPECTOMY WITH RADIOACTIVE SEED LOCALIZATION Right 05/09/2013   Procedure: BREAST LUMPECTOMY WITH RADIOACTIVE SEED LOCALIZATION;  Surgeon: Adin Hector, MD;  Location: [facility];  Service: General;  Laterality: Right;  . COLONOSCOPY    . EYE SURGERY     both cataracts  . TONSILLECTOMY    . TUBAL LIGATION      Family History  Problem Relation Age of Onset  . Lung cancer Paternal Aunt     Social History:  reports that she has never smoked. She has never used smokeless tobacco. She reports current alcohol use. She reports that she does not use drugs.  Allergies:  Allergies  Allergen Reactions  . Sulfa Antibiotics Rash    Medications:  Scheduled: Continuous:  Results for orders placed or performed during the hospital encounter of 11/14/19 (from the past 24 hour(s))  Basic metabolic panel     Status: Abnormal   Collection Time: 11/14/19  2:48 PM  Result Value Ref Range   Sodium 138 135 - 145 mmol/L   Potassium 3.9 3.5 - 5.1 mmol/L   Chloride 103 98 - 111 mmol/L   CO2 25 22 - 32 mmol/L   Glucose, Bld 107 (H) 70 - 99 mg/dL   BUN 10 8 - 23 mg/dL   Creatinine, Ser 0.80 0.44 - 1.00 mg/dL   Calcium 9.4 8.9 - 10.3 mg/dL   GFR, Estimated >60 >60 mL/min   Anion gap 10 5 - 15  CBC with Differential     Status: None   Collection Time: 11/14/19  2:48 PM  Result Value Ref Range   WBC 8.5 4.0 - 10.5 K/uL   RBC 4.48 3.87 - 5.11 MIL/uL   Hemoglobin 12.5 12.0 - 15.0 g/dL   HCT 39.3 36 - 46 %   MCV 87.7 80.0 - 100.0 fL   MCH 27.9 26.0 - 34.0 pg   MCHC 31.8 30.0 - 36.0 g/dL   RDW 13.6 11.5 - 15.5 %   Platelets 316 150 - 400 K/uL   nRBC 0.0 0.0 - 0.2 %   Neutrophils Relative % 73 %   Neutro Abs 6.1 1.7 -  7.7 K/uL   Lymphocytes Relative 15 %   Lymphs Abs 1.3 0.7 - 4.0 K/uL   Monocytes Relative 10 %   Monocytes Absolute 0.9 0.1 - 1.0 K/uL   Eosinophils Relative 2 %   Eosinophils Absolute 0.2 0.0 - 0.5 K/uL   Basophils Relative 0 %   Basophils Absolute 0.0 0.0 - 0.1 K/uL   Immature Granulocytes 0 %   Abs Immature Granulocytes 0.03 0.00 - 0.07 K/uL  Magnesium     Status: None   Collection Time: 11/14/19  2:48 PM  Result Value Ref Range   Magnesium 2.0 1.7 - 2.4 mg/dL  Respiratory Panel by RT PCR (Flu A&B, Covid) - Nasopharyngeal Swab     Status: None   Collection Time: 11/14/19  2:48 PM   Specimen: Nasopharyngeal Swab  Result Value Ref Range   SARS Coronavirus 2 by RT PCR NEGATIVE NEGATIVE   Influenza A by PCR NEGATIVE NEGATIVE   Influenza B by PCR NEGATIVE NEGATIVE  CBG monitoring, ED     Status: Abnormal   Collection Time: 11/14/19  3:10 PM  Result Value Ref Range   Glucose-Capillary 120 (H) 70 - 99 mg/dL     DG  Chest Port 1 View  Result Date: 11/14/2019 CLINICAL DATA:  Possible impacted ingested pill, difficulty swallowing, mid chest pain and pressure EXAM: PORTABLE CHEST 1 VIEW COMPARISON:  10/27/2019 FINDINGS: Single frontal view of the chest demonstrates an unremarkable cardiac silhouette. No airspace disease, effusion, or pneumothorax. No acute bony abnormalities. No radiopaque foreign bodies. Stable degenerative changes of the shoulders. IMPRESSION: 1. No acute intrathoracic process. No radiopaque foreign body identified. Electronically Signed   By: Randa Ngo M.D.   On: 11/14/2019 15:00    ROS:  As stated above in the HPI otherwise negative.  Blood pressure (!) 83/64, pulse (!) 57, temperature 98.3 F (36.8 C), temperature source Oral, resp. rate 17, height 5\' 3"  (1.6 m), weight 75.3 kg, SpO2 96 %.    PE: Gen: NAD, Alert and Oriented HEENT:  [place]/AT, EOMI Neck: Supple, no LAD Lungs: CTA Bilaterally CV: RRR without M/G/R ABD: Soft, NTND, +BS Ext: No C/C/E  Assessment/Plan: 1) Dysphagia. 2) Multiple medical issues.  Plan: 1) EGD now.  Mackenzee Becvar D 11/14/2019, 6:21 PM

## 2019-11-14 NOTE — Op Note (Signed)
[facility] Patient Name: Cristina Douglas Procedure Date : 11/14/2019 MRN: [id number] Attending MD: Carol Ada , MD Date of Birth: 01-Feb-1935 CSN: [id number] Age: 84 Admit Type: Inpatient Procedure:                Upper GI endoscopy Indications:              Dysphagia Providers:                Carol Ada, MD, Benay Pillow, RN, Ladona Ridgel, Technician Referring MD:              Medicines:                Fentanyl 75 micrograms IV, Midazolam 5 mg IV Complications:            No immediate complications. Estimated Blood Loss:     Estimated blood loss: none. Procedure:                Pre-Anesthesia Assessment:                           - Prior to the procedure, a History and Physical                            was performed, and patient medications and                            allergies were reviewed. The patient's tolerance of                            previous anesthesia was also reviewed. The risks                            and benefits of the procedure and the sedation                            options and risks were discussed with the patient.                            All questions were answered, and informed consent                            was obtained. Prior Anticoagulants: The patient has                            taken no previous anticoagulant or antiplatelet                            agents. ASA Grade Assessment: III - A patient with                            severe systemic disease. After reviewing the risks  and benefits, the patient was deemed in                            satisfactory condition to undergo the procedure.                           - Sedation was administered by an endoscopy nurse.                            The sedation level attained was moderate.                           After obtaining informed consent, the endoscope was                            passed under direct  vision. Throughout the                            procedure, the patient's blood pressure, pulse, and                            oxygen saturations were monitored continuously. The                            GIF-H190 ([id number]) Olympus gastroscope was                            introduced through the mouth, and advanced to the                            second part of duodenum. The upper GI endoscopy was                            accomplished without difficulty. The patient                            tolerated the procedure well. Scope In: Scope Out: Findings:      Two benign-appearing, intrinsic mild stenoses were found at the       cricopharyngeus. The narrowest stenosis measured 1.2 cm (inner diameter)       x 1 cm (in length). The stenoses were traversed.      LA Grade A (one or more mucosal breaks less than 5 mm, not extending       between tops of 2 mucosal folds) esophagitis with no bleeding was found       at the gastroesophageal junction.      A 2 cm hiatal hernia was present.      The stomach was normal.      The examined duodenum was normal.      At 18 cm from the incisors a cervical esophageal web was encountered       with the potassium pill proximal to this area. The pill was able to be       pushed through the stenosis with mild forward pressure. There was       evidence of mucosal irritation at the web.  In the distal esophagus there       was evidence of a nonobstructive Schatzki's ring. An LA Grade A       esophagitis was identified. Impression:               - Benign-appearing esophageal stenoses.                           - LA Grade A reflux esophagitis with no bleeding.                           - 2 cm hiatal hernia.                           - Normal stomach.                           - Normal examined duodenum.                           - No specimens collected. Recommendation:           - Discharge patient to home (ambulatory).                           -  Resume regular diet.                           - Continue present medications. PPI QD.                           - Return to GI office in 2 weeks with Dr. Collene Mares.                           - Break the potassium pills in half. Do not take                            the over-the-counter glucosamine and CoQ-10 tablets. Procedure Code(s):        --- Professional ---                           [phone], Esophagogastroduodenoscopy, flexible,                            transoral; diagnostic, including collection of                            specimen(s) by brushing or washing, when performed                            (separate procedure) Diagnosis Code(s):        --- Professional ---                           K22.2, Esophageal obstruction                           K21.00, Gastro-esophageal reflux disease with  esophagitis, without bleeding                           R13.10, Dysphagia, unspecified CPT copyright 2019 American Medical Association. All rights reserved. The codes documented in this report are preliminary and upon coder review may  be revised to meet current compliance requirements. Carol Ada, MD Carol Ada, MD 11/14/2019 7:24:05 PM This report has been signed electronically. Number of Addenda: 0

## 2019-11-14 NOTE — ED Triage Notes (Signed)
Pt recently put on potassium pills, took one today at 10:15 and feels like it is stuck in her throat. Trying to drink fluid to pass it but is vomiting it back up. Having to spit out her saliva d/t being unable to swallow.

## 2019-11-14 NOTE — ED Notes (Signed)
Pt left with husband the husband said somethin terrible happened and the nurse never came back with a w/c  They were leaving

## 2019-11-14 NOTE — Discharge Instructions (Addendum)
Use powder instead of potassium pills to help prevent another esophageal blockage.  You have some strictures in your esophagus that will need to be dilated once inflammation has calmed down.  Make sure you are chewing your food well and eating slowly to prevent another blockage.  Call to schedule close follow-up with Dr. Benson Norway as instructed.

## 2019-11-15 ENCOUNTER — Telehealth: Payer: Self-pay | Admitting: Cardiology

## 2019-11-15 MED ORDER — POTASSIUM CHLORIDE 20 MEQ/15ML (10%) PO SOLN: 10.0000 meq | Freq: Every day | ORAL | 3 refills | Status: DC

## 2019-11-15 NOTE — Telephone Encounter (Signed)
[facility] called requesting to see if patient's potassium can be switched to liquid. Patient cannot swallow the pill.

## 2019-11-15 NOTE — Telephone Encounter (Signed)
Rx for liquid sent

## 2019-11-16 ENCOUNTER — Ambulatory Visit: Payer: Medicare PPO | Admitting: Family Medicine

## 2019-11-18 ENCOUNTER — Encounter (HOSPITAL_COMMUNITY): Payer: Self-pay | Admitting: Gastroenterology

## 2019-11-19 ENCOUNTER — Telehealth: Payer: Self-pay | Admitting: Cardiology

## 2019-11-19 NOTE — Telephone Encounter (Signed)
Patient informed and says that she changed the batteries in her BP monitor and now her BP is normal and reading around 124/70. Advised to call office in 1-2 weeks of checking BP consistently to schedule nurse visit if BP's are still too high. Verbalized understanding of plan.

## 2019-11-19 NOTE — Telephone Encounter (Signed)
New message     Pt c/o medication issue:  1. Name of Medication: potassium chloride 20 MEQ/15ML (10%) SOLN   2. How are you currently taking this medication (dosage and times per day)? 1  3. Are you having a reaction (difficulty breathing--STAT)? yes  4. What is your medication issue? The pill got stuck in her throat this weekend and she had to go to the ER.  They had to do a upper GI and push the pill through, the put her on a liquid potassium and it is running her bp up 180/80 hr 55

## 2019-11-19 NOTE — Telephone Encounter (Signed)
Pt states that her blood pressure has been elevated today. She feels that it is the liquid potassium. Informed pt that K+ should not elevate blood pressure. Pt states she is taking potassium in low sodium V8. Informed pt that this does have some sodium in it. Pt repots current BP as 181/94 HR 58. Pt has not taken potassium today.  Please advise.

## 2019-11-19 NOTE — Telephone Encounter (Signed)
I think it is unlikely that the potassium supplement is causing her blood pressure to increase.  She is on losartan, HCTZ, and Lopressor.  Blood pressure may be trending up and she may require medication adjustments however.  Would have her track blood pressure for the next 1 to 2 weeks, and schedule a nurse visit in [place] for blood pressure check and review of measurements.

## 2019-11-21 DIAGNOSIS — I471 Supraventricular tachycardia: Secondary | ICD-10-CM | POA: Diagnosis not present

## 2019-11-22 DIAGNOSIS — E119 Type 2 diabetes mellitus without complications: Secondary | ICD-10-CM | POA: Diagnosis not present

## 2019-11-22 DIAGNOSIS — H35033 Hypertensive retinopathy, bilateral: Secondary | ICD-10-CM | POA: Diagnosis not present

## 2019-11-22 DIAGNOSIS — H43392 Other vitreous opacities, left eye: Secondary | ICD-10-CM | POA: Diagnosis not present

## 2019-11-22 DIAGNOSIS — H26492 Other secondary cataract, left eye: Secondary | ICD-10-CM | POA: Diagnosis not present

## 2019-11-27 ENCOUNTER — Telehealth: Payer: Self-pay

## 2019-11-27 NOTE — Telephone Encounter (Signed)
NOTES ON FILE IN CHART PREP

## 2019-11-28 DIAGNOSIS — I472 Ventricular tachycardia: Secondary | ICD-10-CM | POA: Diagnosis not present

## 2019-11-28 DIAGNOSIS — C50911 Malignant neoplasm of unspecified site of right female breast: Secondary | ICD-10-CM | POA: Diagnosis not present

## 2019-11-28 DIAGNOSIS — E876 Hypokalemia: Secondary | ICD-10-CM | POA: Diagnosis not present

## 2019-11-28 DIAGNOSIS — E039 Hypothyroidism, unspecified: Secondary | ICD-10-CM | POA: Diagnosis not present

## 2019-11-28 DIAGNOSIS — Z6829 Body mass index (BMI) 29.0-29.9, adult: Secondary | ICD-10-CM | POA: Diagnosis not present

## 2019-12-04 DIAGNOSIS — Z86010 Personal history of colonic polyps: Secondary | ICD-10-CM | POA: Diagnosis not present

## 2019-12-04 DIAGNOSIS — K5730 Diverticulosis of large intestine without perforation or abscess without bleeding: Secondary | ICD-10-CM | POA: Diagnosis not present

## 2019-12-07 ENCOUNTER — Other Ambulatory Visit: Payer: Self-pay

## 2019-12-07 ENCOUNTER — Ambulatory Visit
Admit: 2019-12-07 | Discharge: 2019-12-07 | Disposition: A | Discharge status: Home / Self Care | Payer: Medicare PPO | Attending: Urology | Admitting: Urology

## 2019-12-07 DIAGNOSIS — D49511 Neoplasm of unspecified behavior of right kidney: Secondary | ICD-10-CM

## 2019-12-07 DIAGNOSIS — N2889 Other specified disorders of kidney and ureter: Secondary | ICD-10-CM | POA: Diagnosis not present

## 2019-12-07 MED ORDER — GADOBENATE DIMEGLUMINE 529 MG/ML IV SOLN
15.0000 mL | Freq: Once | INTRAVENOUS | Status: AC | PRN
  Administered 2019-12-07: 15 mL via INTRAVENOUS

## 2019-12-11 DIAGNOSIS — D49511 Neoplasm of unspecified behavior of right kidney: Secondary | ICD-10-CM | POA: Diagnosis not present

## 2019-12-17 ENCOUNTER — Telehealth: Payer: Self-pay | Admitting: Cardiology

## 2019-12-17 NOTE — Telephone Encounter (Signed)
New message    Patient wants to know when she can get cardiac clearance for surgery on her shoulder ? She is having a lot of pain in it

## 2019-12-17 NOTE — Telephone Encounter (Signed)
Spoke with husband, Ardyth Gal), and advised to have surgeon office contact us directly about surgical clearance.Verbalized understanding.

## 2019-12-24 ENCOUNTER — Other Ambulatory Visit: Payer: Self-pay | Admitting: Cardiology

## 2020-01-09 DIAGNOSIS — E1165 Type 2 diabetes mellitus with hyperglycemia: Secondary | ICD-10-CM | POA: Diagnosis not present

## 2020-01-09 DIAGNOSIS — H35039 Hypertensive retinopathy, unspecified eye: Secondary | ICD-10-CM | POA: Diagnosis not present

## 2020-01-09 DIAGNOSIS — J019 Acute sinusitis, unspecified: Secondary | ICD-10-CM | POA: Diagnosis not present

## 2020-01-09 DIAGNOSIS — I472 Ventricular tachycardia: Secondary | ICD-10-CM | POA: Diagnosis not present

## 2020-01-09 DIAGNOSIS — E876 Hypokalemia: Secondary | ICD-10-CM | POA: Diagnosis not present

## 2020-01-09 DIAGNOSIS — E039 Hypothyroidism, unspecified: Secondary | ICD-10-CM | POA: Diagnosis not present

## 2020-01-09 DIAGNOSIS — Z20828 Contact with and (suspected) exposure to other viral communicable diseases: Secondary | ICD-10-CM | POA: Diagnosis not present

## 2020-01-14 ENCOUNTER — Encounter: Payer: Self-pay | Admitting: Cardiology

## 2020-01-14 ENCOUNTER — Ambulatory Visit: Payer: Medicare PPO | Admitting: Cardiology

## 2020-01-14 VITALS — BP 120/82 | HR 78 | Ht 63.0 in | Wt 167.0 lb

## 2020-01-14 DIAGNOSIS — I472 Ventricular tachycardia: Secondary | ICD-10-CM | POA: Diagnosis not present

## 2020-01-14 DIAGNOSIS — I4729 Other ventricular tachycardia: Secondary | ICD-10-CM

## 2020-01-14 DIAGNOSIS — I10 Essential (primary) hypertension: Secondary | ICD-10-CM | POA: Diagnosis not present

## 2020-01-14 NOTE — Progress Notes (Signed)
Cardiology Office Note  Date: 01/14/2020   ID: Cristina Douglas, DOB 03/18/1935, MRN [id number]  PCP:  Curlene Labrum, MD  Cardiologist:  Rozann Lesches, MD Electrophysiologist:  None   Chief Complaint  Patient presents with  . Cardiac follow-up    History of Present Illness: Cristina Douglas is an 85 y.o. female last seen in October 2021.  She presents for a routine visit, doing well with no recurrent palpitations, no dizziness or chest pain.  She saw Dr. Rayann Heman in November 2021, I reviewed the note.  I went over her medications which are listed below.  She has done well on flecainide.  Plan to continue observation for now.  Past Medical History:  Diagnosis Date  . Basal cell carcinoma   . Breast cancer ([place])    DCIS right breast status post radiation and lumpectomy.  . Cataract   . Diverticulitis   . Diverticulosis   . Essential hypertension   . Hyperlipidemia   . Hypothyroidism   . PVC's (premature ventricular contractions)   . Type 2 diabetes mellitus ([place])   . Ventricular tachycardia ([place])    Suspected RVOT VT, normal coronaries October 2021    Past Surgical History:  Procedure Laterality Date  . APPENDECTOMY    . BREAST BIOPSY Right 04/10/2013  . BREAST LUMPECTOMY Right 05/09/2013  . BREAST LUMPECTOMY WITH RADIOACTIVE SEED LOCALIZATION Right 05/09/2013   Procedure: BREAST LUMPECTOMY WITH RADIOACTIVE SEED LOCALIZATION;  Surgeon: Adin Hector, MD;  Location: [facility];  Service: General;  Laterality: Right;  . COLONOSCOPY    . ESOPHAGOGASTRODUODENOSCOPY N/A 11/14/2019   Procedure: ESOPHAGOGASTRODUODENOSCOPY (EGD);  Surgeon: Carol Ada, MD;  Location: [facility];  Service: Endoscopy;  Laterality: N/A;  . EYE SURGERY     both cataracts  . FOREIGN BODY REMOVAL  11/14/2019   Procedure: FOREIGN BODY REMOVAL;  Surgeon: Carol Ada, MD;  Location: [place];  Service: Endoscopy;;  . TONSILLECTOMY    . TUBAL LIGATION       Current Outpatient Medications  Medication Sig Dispense Refill  . atorvastatin (LIPITOR) 80 MG tablet Take 80 mg by mouth at bedtime.     . [place]-Glucosamine-Vit D (OSTEO BI-FLEX ONE PER DAY PO) Take by mouth.    . Calcium Carbonate-Vitamin D 600-400 MG-UNIT tablet Take 1 tablet by mouth daily.    . cholecalciferol (VITAMIN D) 1000 UNITS tablet Take 1,000 Units by mouth daily.      . Coenzyme Q10 (CO Q 10) 100 MG CAPS Take 100 mg by mouth daily.     . flecainide (TAMBOCOR) 50 MG tablet TAKE 1 TABLET BY MOUTH EVERY TWELVE HOURS 60 tablet 6  . hydrochlorothiazide (MICROZIDE) 12.5 MG capsule Take 1 capsule (12.5 mg total) by mouth daily. 90 capsule 3  . levothyroxine (SYNTHROID, LEVOTHROID) 88 MCG tablet Take 88 mcg by mouth daily before breakfast.     . losartan (COZAAR) 50 MG tablet Take 50 mg by mouth 2 (two) times daily.    Marland Kitchen MAGNESIUM OXIDE PO Take 1 tablet by mouth at bedtime.    . metFORMIN (GLUCOPHAGE-XR) 500 MG 24 hr tablet Take 1,000 mg by mouth 2 (two) times daily.    . metoprolol tartrate (LOPRESSOR) 25 MG tablet TAKE 1 TABLET BY MOUTH EVERY DAY (Patient taking differently: Take 25 mg by mouth daily.) 90 tablet 1  . Misc Natural Products (TURMERIC CURCUMIN) CAPS Take 1 capsule by mouth daily.     . Multiple Vitamin (MULTIVITAMIN) tablet Take  1 tablet by mouth daily.      . potassium chloride 20 MEQ/15ML (10%) SOLN Take 7.5 mLs (10 mEq total) by mouth daily. 675 mL 3   No current facility-administered medications for this visit.   Allergies:  Sulfa antibiotics   ROS: No syncope.  Physical Exam: VS:  BP 120/82   Pulse 78   Ht 5\' 3"  (1.6 m)   Wt 167 lb (75.8 kg)   SpO2 96%   BMI 29.58 kg/m , BMI Body mass index is 29.58 kg/m.  Wt Readings from Last 3 Encounters:  01/14/20 167 lb (75.8 kg)  11/14/19 166 lb (75.3 kg)  11/12/19 168 lb 6.4 oz (76.4 kg)    General: Elderly woman, appears comfortable at rest. HEENT: Conjunctiva and lids normal, wearing a mask. Neck:  Supple, no elevated JVP or carotid bruits, no thyromegaly. Lungs: Clear to auscultation, nonlabored breathing at rest. Cardiac: Regular rate and rhythm, no S3 or significant systolic murmur. Extremities: No pitting edema.  ECG:  An ECG dated 11/12/2019 was personally reviewed today and demonstrated:  Sinus rhythm.  Recent Labwork: 11/14/2019: BUN 10; Creatinine, Ser 0.80; Hemoglobin 12.5; Magnesium 2.0; Platelets 316; Potassium 3.9; Sodium 138   Other Studies Reviewed Today:  Echocardiogram 10/28/2019 [facility]): Summary  1. Technically difficult study.  2. The left ventricle is normal in size with normal wall thickness.  3. The left ventricular systolic function is normal, LVEF is visually  estimated at > 55%.  4. The right ventricle is normal in size, with normal systolic function.  5. There is mild pulmonary hypertension, estimated pulmonary artery systolic  pressure is 33 mmHg.   Cardiac catheterization 10/29/2019 [facility]): Hemodynamics and Left Heart Catheterization  Aortic pressure: 131/60 mm Hg (mean 89 mm Hg)  LVEDP = 10 mm Hg Left Ventriculogram  RAO Left Ventriculogram:Normal  Ejection Fraction (visual estimate): 55% Coronary Angiography Dominance: Right Left Main: The left main coronary artery (LMCA) is a large-caliber vessel that originates from the left coronary sinus. It bifurcates into the left anterior descending (LAD) and left circumflex (LCx) arteries. There is no angiographic evidence of significant disease in the LMCA. LAD: The LAD is a large-caliber vessel that gives off 2 diagonal (D) branches before it wraps around the apex. D1 is a large-caliber vessel. D2 is a small-caliber vessel. There is no angiographic evidence of significant disease in the LAD. Left Circumflex: The LCx is a large-caliber vessel that gives off 3 obtuse marginal (OM) branches and then continues as a small vessel in the AV groove. OM1 is a very small-caliber vessel. OM2  is a very small-caliber vessel. OM3 is a large-caliber trifurcating vessel. There is no angiographic evidence of significant disease in the LCx. Right Coronary: The right coronary artery (RCA) is a large-caliber vessel originating from the right coronary sinus. It bifurcates distally into a moderate-caliber posterior descending artery (PDA) and moderate posterolateral (PL) branches consistent with a right dominant system. There is no angiographic evidence of significant disease in the RCA.  Assessment and Plan:  1. RVOT VT. symptomatically stable at this time on flecainide along with Lopressor.  She had interval follow-up with Dr. Rayann Heman, plan to continue observation for now.  2. Essential hypertension, continue losartan, HCTZ, and potassium supplement with follow-up per Dr. Pleas Koch.  Medication Adjustments/Labs and Tests Ordered: Current medicines are reviewed at length with the patient today.  Concerns regarding medicines are outlined above.   Tests Ordered: No orders of the defined types were placed in  this encounter.   Medication Changes: No orders of the defined types were placed in this encounter.   Disposition:  Follow up 6 months in the [place] office.  Signed, Satira Sark, MD, [facility] 01/14/2020 4:51 PM    [facility] [address], [place], [place] [address] Phone: [phone]; Fax: [phone]

## 2020-01-14 NOTE — Patient Instructions (Addendum)

## 2020-01-24 DIAGNOSIS — E7800 Pure hypercholesterolemia, unspecified: Secondary | ICD-10-CM | POA: Diagnosis not present

## 2020-01-24 DIAGNOSIS — E039 Hypothyroidism, unspecified: Secondary | ICD-10-CM | POA: Diagnosis not present

## 2020-01-24 DIAGNOSIS — I10 Essential (primary) hypertension: Secondary | ICD-10-CM | POA: Diagnosis not present

## 2020-01-24 DIAGNOSIS — R739 Hyperglycemia, unspecified: Secondary | ICD-10-CM | POA: Diagnosis not present

## 2020-01-24 DIAGNOSIS — E1165 Type 2 diabetes mellitus with hyperglycemia: Secondary | ICD-10-CM | POA: Diagnosis not present

## 2020-01-31 DIAGNOSIS — E7800 Pure hypercholesterolemia, unspecified: Secondary | ICD-10-CM | POA: Diagnosis not present

## 2020-01-31 DIAGNOSIS — E039 Hypothyroidism, unspecified: Secondary | ICD-10-CM | POA: Diagnosis not present

## 2020-01-31 DIAGNOSIS — E1165 Type 2 diabetes mellitus with hyperglycemia: Secondary | ICD-10-CM | POA: Diagnosis not present

## 2020-01-31 DIAGNOSIS — I10 Essential (primary) hypertension: Secondary | ICD-10-CM | POA: Diagnosis not present

## 2020-02-01 ENCOUNTER — Telehealth: Payer: Self-pay | Admitting: Cardiology

## 2020-02-01 NOTE — Telephone Encounter (Signed)
This is a rare but documented side effect of flecainide. Not necessarily from medication, but could be. I will defer to Dr. Rayann Heman or Domenic Polite as far as possible changes

## 2020-02-01 NOTE — Telephone Encounter (Signed)
Husband called office back and says that he would like to cancel this request since he has already spoken with his pharmacist. Advised that chart would be updated.

## 2020-02-01 NOTE — Telephone Encounter (Signed)
Husband called stating that he thinks the medication  Flecainide 50 mg has changed patient's personality. .  Husband states that she is "flying off the handle" with him.One minute her personality is sweet and then she turns angry.

## 2020-02-20 DIAGNOSIS — M1711 Unilateral primary osteoarthritis, right knee: Secondary | ICD-10-CM | POA: Diagnosis not present

## 2020-02-21 DIAGNOSIS — Z6829 Body mass index (BMI) 29.0-29.9, adult: Secondary | ICD-10-CM | POA: Diagnosis not present

## 2020-02-21 DIAGNOSIS — M5416 Radiculopathy, lumbar region: Secondary | ICD-10-CM | POA: Diagnosis not present

## 2020-02-21 DIAGNOSIS — I10 Essential (primary) hypertension: Secondary | ICD-10-CM | POA: Diagnosis not present

## 2020-02-24 ENCOUNTER — Other Ambulatory Visit: Payer: Self-pay | Admitting: Cardiology

## 2020-02-27 DIAGNOSIS — M1711 Unilateral primary osteoarthritis, right knee: Secondary | ICD-10-CM | POA: Diagnosis not present

## 2020-02-28 DIAGNOSIS — E039 Hypothyroidism, unspecified: Secondary | ICD-10-CM | POA: Diagnosis not present

## 2020-02-28 DIAGNOSIS — I10 Essential (primary) hypertension: Secondary | ICD-10-CM | POA: Diagnosis not present

## 2020-02-28 DIAGNOSIS — E1165 Type 2 diabetes mellitus with hyperglycemia: Secondary | ICD-10-CM | POA: Diagnosis not present

## 2020-03-05 DIAGNOSIS — M1711 Unilateral primary osteoarthritis, right knee: Secondary | ICD-10-CM | POA: Diagnosis not present

## 2020-03-17 ENCOUNTER — Other Ambulatory Visit: Payer: Self-pay | Admitting: Urology

## 2020-03-17 DIAGNOSIS — D49511 Neoplasm of unspecified behavior of right kidney: Secondary | ICD-10-CM

## 2020-03-20 DIAGNOSIS — I472 Ventricular tachycardia: Secondary | ICD-10-CM | POA: Diagnosis not present

## 2020-03-20 DIAGNOSIS — E7800 Pure hypercholesterolemia, unspecified: Secondary | ICD-10-CM | POA: Diagnosis not present

## 2020-03-20 DIAGNOSIS — C649 Malignant neoplasm of unspecified kidney, except renal pelvis: Secondary | ICD-10-CM | POA: Diagnosis not present

## 2020-03-20 DIAGNOSIS — I10 Essential (primary) hypertension: Secondary | ICD-10-CM | POA: Diagnosis not present

## 2020-03-20 DIAGNOSIS — E876 Hypokalemia: Secondary | ICD-10-CM | POA: Diagnosis not present

## 2020-03-20 DIAGNOSIS — E1165 Type 2 diabetes mellitus with hyperglycemia: Secondary | ICD-10-CM | POA: Diagnosis not present

## 2020-03-20 DIAGNOSIS — E039 Hypothyroidism, unspecified: Secondary | ICD-10-CM | POA: Diagnosis not present

## 2020-03-20 DIAGNOSIS — M899 Disorder of bone, unspecified: Secondary | ICD-10-CM | POA: Diagnosis not present

## 2020-03-20 DIAGNOSIS — R739 Hyperglycemia, unspecified: Secondary | ICD-10-CM | POA: Diagnosis not present

## 2020-03-28 DIAGNOSIS — E7801 Familial hypercholesterolemia: Secondary | ICD-10-CM | POA: Diagnosis not present

## 2020-03-28 DIAGNOSIS — E559 Vitamin D deficiency, unspecified: Secondary | ICD-10-CM | POA: Diagnosis not present

## 2020-03-28 DIAGNOSIS — D519 Vitamin B12 deficiency anemia, unspecified: Secondary | ICD-10-CM | POA: Diagnosis not present

## 2020-03-28 DIAGNOSIS — E876 Hypokalemia: Secondary | ICD-10-CM | POA: Diagnosis not present

## 2020-03-28 DIAGNOSIS — M25561 Pain in right knee: Secondary | ICD-10-CM | POA: Diagnosis not present

## 2020-03-28 DIAGNOSIS — R5383 Other fatigue: Secondary | ICD-10-CM | POA: Diagnosis not present

## 2020-03-28 DIAGNOSIS — E7800 Pure hypercholesterolemia, unspecified: Secondary | ICD-10-CM | POA: Diagnosis not present

## 2020-03-28 DIAGNOSIS — E039 Hypothyroidism, unspecified: Secondary | ICD-10-CM | POA: Diagnosis not present

## 2020-03-28 DIAGNOSIS — I10 Essential (primary) hypertension: Secondary | ICD-10-CM | POA: Diagnosis not present

## 2020-03-28 DIAGNOSIS — E1165 Type 2 diabetes mellitus with hyperglycemia: Secondary | ICD-10-CM | POA: Diagnosis not present

## 2020-03-31 ENCOUNTER — Other Ambulatory Visit: Payer: Self-pay | Admitting: Orthopedic Surgery

## 2020-03-31 DIAGNOSIS — M25561 Pain in right knee: Secondary | ICD-10-CM

## 2020-04-01 DIAGNOSIS — E1165 Type 2 diabetes mellitus with hyperglycemia: Secondary | ICD-10-CM | POA: Diagnosis not present

## 2020-04-01 DIAGNOSIS — I472 Ventricular tachycardia: Secondary | ICD-10-CM | POA: Diagnosis not present

## 2020-04-01 DIAGNOSIS — Z0001 Encounter for general adult medical examination with abnormal findings: Secondary | ICD-10-CM | POA: Diagnosis not present

## 2020-04-01 DIAGNOSIS — E876 Hypokalemia: Secondary | ICD-10-CM | POA: Diagnosis not present

## 2020-04-01 DIAGNOSIS — N189 Chronic kidney disease, unspecified: Secondary | ICD-10-CM | POA: Diagnosis not present

## 2020-04-01 DIAGNOSIS — Z6829 Body mass index (BMI) 29.0-29.9, adult: Secondary | ICD-10-CM | POA: Diagnosis not present

## 2020-04-01 DIAGNOSIS — D49511 Neoplasm of unspecified behavior of right kidney: Secondary | ICD-10-CM | POA: Diagnosis not present

## 2020-04-01 DIAGNOSIS — K580 Irritable bowel syndrome with diarrhea: Secondary | ICD-10-CM | POA: Diagnosis not present

## 2020-04-01 DIAGNOSIS — I10 Essential (primary) hypertension: Secondary | ICD-10-CM | POA: Diagnosis not present

## 2020-04-08 DIAGNOSIS — M7121 Synovial cyst of popliteal space [Baker], right knee: Secondary | ICD-10-CM | POA: Diagnosis not present

## 2020-04-08 DIAGNOSIS — M25461 Effusion, right knee: Secondary | ICD-10-CM | POA: Diagnosis not present

## 2020-04-08 DIAGNOSIS — S83241A Other tear of medial meniscus, current injury, right knee, initial encounter: Secondary | ICD-10-CM | POA: Diagnosis not present

## 2020-04-08 DIAGNOSIS — M1711 Unilateral primary osteoarthritis, right knee: Secondary | ICD-10-CM | POA: Diagnosis not present

## 2020-04-16 DIAGNOSIS — M1711 Unilateral primary osteoarthritis, right knee: Secondary | ICD-10-CM | POA: Diagnosis not present

## 2020-04-23 DIAGNOSIS — G8929 Other chronic pain: Secondary | ICD-10-CM | POA: Diagnosis not present

## 2020-04-23 DIAGNOSIS — M25561 Pain in right knee: Secondary | ICD-10-CM | POA: Diagnosis not present

## 2020-04-30 DIAGNOSIS — R29898 Other symptoms and signs involving the musculoskeletal system: Secondary | ICD-10-CM | POA: Diagnosis not present

## 2020-04-30 DIAGNOSIS — M25661 Stiffness of right knee, not elsewhere classified: Secondary | ICD-10-CM | POA: Diagnosis not present

## 2020-04-30 DIAGNOSIS — M25561 Pain in right knee: Secondary | ICD-10-CM | POA: Diagnosis not present

## 2020-04-30 DIAGNOSIS — G8929 Other chronic pain: Secondary | ICD-10-CM | POA: Diagnosis not present

## 2020-05-05 DIAGNOSIS — R29898 Other symptoms and signs involving the musculoskeletal system: Secondary | ICD-10-CM | POA: Diagnosis not present

## 2020-05-05 DIAGNOSIS — M25661 Stiffness of right knee, not elsewhere classified: Secondary | ICD-10-CM | POA: Diagnosis not present

## 2020-05-05 DIAGNOSIS — M25561 Pain in right knee: Secondary | ICD-10-CM | POA: Diagnosis not present

## 2020-05-05 DIAGNOSIS — G8929 Other chronic pain: Secondary | ICD-10-CM | POA: Diagnosis not present

## 2020-05-13 DIAGNOSIS — Z23 Encounter for immunization: Secondary | ICD-10-CM | POA: Diagnosis not present

## 2020-06-05 DIAGNOSIS — Z6829 Body mass index (BMI) 29.0-29.9, adult: Secondary | ICD-10-CM | POA: Diagnosis not present

## 2020-06-05 DIAGNOSIS — S30861A Insect bite (nonvenomous) of abdominal wall, initial encounter: Secondary | ICD-10-CM | POA: Diagnosis not present

## 2020-06-12 DIAGNOSIS — E039 Hypothyroidism, unspecified: Secondary | ICD-10-CM | POA: Diagnosis not present

## 2020-06-12 DIAGNOSIS — E1165 Type 2 diabetes mellitus with hyperglycemia: Secondary | ICD-10-CM | POA: Diagnosis not present

## 2020-06-19 DIAGNOSIS — E1165 Type 2 diabetes mellitus with hyperglycemia: Secondary | ICD-10-CM | POA: Diagnosis not present

## 2020-06-19 DIAGNOSIS — I10 Essential (primary) hypertension: Secondary | ICD-10-CM | POA: Diagnosis not present

## 2020-06-19 DIAGNOSIS — C649 Malignant neoplasm of unspecified kidney, except renal pelvis: Secondary | ICD-10-CM | POA: Diagnosis not present

## 2020-06-19 DIAGNOSIS — E039 Hypothyroidism, unspecified: Secondary | ICD-10-CM | POA: Diagnosis not present

## 2020-06-19 DIAGNOSIS — E876 Hypokalemia: Secondary | ICD-10-CM | POA: Diagnosis not present

## 2020-06-19 DIAGNOSIS — I472 Ventricular tachycardia: Secondary | ICD-10-CM | POA: Diagnosis not present

## 2020-06-19 DIAGNOSIS — E7800 Pure hypercholesterolemia, unspecified: Secondary | ICD-10-CM | POA: Diagnosis not present

## 2020-06-19 DIAGNOSIS — M899 Disorder of bone, unspecified: Secondary | ICD-10-CM | POA: Diagnosis not present

## 2020-06-23 ENCOUNTER — Other Ambulatory Visit: Payer: Self-pay | Admitting: Obstetrics and Gynecology

## 2020-06-23 DIAGNOSIS — Z1231 Encounter for screening mammogram for malignant neoplasm of breast: Secondary | ICD-10-CM

## 2020-07-14 ENCOUNTER — Encounter: Payer: Self-pay | Admitting: Cardiology

## 2020-07-14 ENCOUNTER — Other Ambulatory Visit: Payer: Self-pay

## 2020-07-14 ENCOUNTER — Ambulatory Visit: Payer: Medicare PPO | Admitting: Cardiology

## 2020-07-14 VITALS — BP 136/72 | HR 66 | Ht 63.0 in | Wt 168.0 lb

## 2020-07-14 DIAGNOSIS — I4729 Other ventricular tachycardia: Secondary | ICD-10-CM

## 2020-07-14 DIAGNOSIS — I10 Essential (primary) hypertension: Secondary | ICD-10-CM

## 2020-07-14 DIAGNOSIS — I472 Ventricular tachycardia: Secondary | ICD-10-CM | POA: Diagnosis not present

## 2020-07-14 NOTE — Progress Notes (Signed)
Cardiology Office Note  Date: 07/14/2020   ID: Cristina Douglas, DOB 1935-01-27, MRN [id number]  PCP:  Curlene Labrum, MD  Cardiologist:  Rozann Lesches, MD Electrophysiologist:  None   Chief Complaint  Patient presents with   Cardiac follow-up    History of Present Illness: Cristina Douglas is an 85 y.o. female last seen in January.  She is here for a routine visit.  Reports no chest pain, palpitations, or syncope.  I personally reviewed her ECG which shows sinus rhythm with decreased R wave progression.  We went over her medications.  She states that she has not been consistent with her potassium supplement, had trouble swallowing the pill and does not like taking the liquid either.  Her blood pressure has been overall well controlled.  We discussed stopping HCTZ and her potassium supplement for now.  If blood pressure trends up Aldactone could be considered.  I reviewed her recent lab work.  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   Cataract    Diverticulitis    Diverticulosis    Essential hypertension    Hyperlipidemia    Hypothyroidism    PVC's (premature ventricular contractions)    Type 2 diabetes mellitus ([place])    Ventricular tachycardia ([place])    Suspected RVOT VT, normal coronaries October 2021    Past Surgical History:  Procedure Laterality Date   APPENDECTOMY     BREAST BIOPSY Right 04/10/2013   BREAST LUMPECTOMY Right 05/09/2013   BREAST LUMPECTOMY WITH RADIOACTIVE SEED LOCALIZATION Right 05/09/2013   Procedure: BREAST LUMPECTOMY WITH RADIOACTIVE SEED LOCALIZATION;  Surgeon: Adin Hector, MD;  Location: [facility];  Service: General;  Laterality: Right;   COLONOSCOPY     ESOPHAGOGASTRODUODENOSCOPY N/A 11/14/2019   Procedure: ESOPHAGOGASTRODUODENOSCOPY (EGD);  Surgeon: Carol Ada, MD;  Location: [facility];  Service:  Endoscopy;  Laterality: N/A;   EYE SURGERY     both cataracts   FOREIGN BODY REMOVAL  11/14/2019   Procedure: FOREIGN BODY REMOVAL;  Surgeon: Carol Ada, MD;  Location: [place];  Service: Endoscopy;;   TONSILLECTOMY     TUBAL LIGATION      Current Outpatient Medications  Medication Sig Dispense Refill   atorvastatin (LIPITOR) 80 MG tablet Take 80 mg by mouth at bedtime.      Boswellia-Glucosamine-Vit D (OSTEO BI-FLEX ONE PER DAY PO) Take by mouth.     Calcium Carbonate-Vitamin D 600-400 MG-UNIT tablet Take 1 tablet by mouth daily.     cholecalciferol (VITAMIN D) 1000 UNITS tablet Take 1,000 Units by mouth daily.       Coenzyme Q10 (CO Q 10) 100 MG CAPS Take 100 mg by mouth daily.      flecainide (TAMBOCOR) 50 MG tablet TAKE 1 TABLET BY MOUTH EVERY TWELVE HOURS 60 tablet 6   levothyroxine (SYNTHROID, LEVOTHROID) 88 MCG tablet Take 88 mcg by mouth daily before breakfast.      losartan (COZAAR) 50 MG tablet Take 50 mg by mouth 2 (two) times daily.     MAGNESIUM OXIDE PO Take 1 tablet by mouth at bedtime.     metFORMIN (GLUCOPHAGE-XR) 500 MG 24 hr tablet Take 1,000 mg by mouth 2 (two) times daily.     metoprolol tartrate (LOPRESSOR) 25 MG tablet Take 1 tablet (25 mg total) by mouth daily. 90 tablet 1   Misc Natural Products (  TURMERIC CURCUMIN) CAPS Take 1 capsule by mouth daily.      Multiple Vitamin (MULTIVITAMIN) tablet Take 1 tablet by mouth daily.       No current facility-administered medications for this visit.   Allergies:  Sulfa antibiotics   ROS: No syncope.  Physical Exam: VS:  BP 136/72   Pulse 66   Ht 5\' 3"  (1.6 m)   Wt 168 lb (76.2 kg)   SpO2 96%   BMI 29.76 kg/m , BMI Body mass index is 29.76 kg/m.  Wt Readings from Last 3 Encounters:  07/14/20 168 lb (76.2 kg)  01/14/20 167 lb (75.8 kg)  11/14/19 166 lb (75.3 kg)    General: Elderly woman, appears comfortable at rest. HEENT: Conjunctiva and lids normal, wearing a mask. Neck: Supple, no elevated JVP  or carotid bruits, no thyromegaly. Lungs: Clear to auscultation, nonlabored breathing at rest. Cardiac: Regular rate and rhythm, no S3 or significant systolic murmur. Extremities: No pitting edema.  ECG:  An ECG dated 11/12/2019 was personally reviewed today and demonstrated:  Sinus rhythm.  Recent Labwork: 11/14/2019: BUN 10; Creatinine, Ser 0.80; Hemoglobin 12.5; Magnesium 2.0; Platelets 316; Potassium 3.9; Sodium 138  June 2022: ALT 14, AST 21, cholesterol 178, triglycerides 139, HDL 47, LDL 103, potassium 4.2, BUN 18, creatinine 0.66, hemoglobin A1c 7.5%, TSH 1.58  Other Studies Reviewed Today:  Echocardiogram 10/28/2019 [facility]): Summary    1. Technically difficult study.    2. The left ventricle is normal in size with normal wall thickness.    3. The left ventricular systolic function is normal, LVEF is visually  estimated at > 55%.    4. The right ventricle is normal in size, with normal systolic function.    5. There is mild pulmonary hypertension, estimated pulmonary artery systolic  pressure is 33 mmHg.     Cardiac catheterization 10/29/2019 [facility]): Hemodynamics and Left Heart Catheterization  Aortic pressure: 131/60 mm Hg (mean 89 mm Hg)  LVEDP = 10 mm Hg Left Ventriculogram  RAO Left Ventriculogram:Normal  Ejection Fraction (visual estimate): 55% Coronary Angiography Dominance: Right Left Main: The left main coronary artery (LMCA) is a large-caliber vessel that originates from the left coronary sinus. It bifurcates into the left anterior descending (LAD) and left circumflex (LCx) arteries. There is no angiographic evidence of significant disease in the LMCA. LAD: The LAD is a large-caliber vessel that gives off 2 diagonal (D) branches before it wraps around the apex. D1 is a large-caliber vessel. D2 is a small-caliber vessel. There is no angiographic evidence of significant disease in the LAD. Left Circumflex: The LCx is a large-caliber vessel that gives off  3 obtuse marginal (OM) branches and then continues as a small vessel in the AV groove. OM1 is a very small-caliber vessel. OM2 is a very small-caliber vessel. OM3 is a large-caliber trifurcating vessel. There is no angiographic evidence of significant disease in the LCx. Right Coronary: The right coronary artery (RCA) is a large-caliber vessel originating from the right coronary sinus. It bifurcates distally into a moderate-caliber posterior descending artery (PDA) and moderate posterolateral (PL) branches consistent with a right dominant system. There is no angiographic evidence of significant disease in the RCA.  Assessment and Plan:  1.  RVOT VT.  Doing well without obvious recurrences and tolerating flecainide and Lopressor.  ECG reviewed.  2.  Essential hypertension.  As discussed above, plan to stop HCTZ since she has not been consistent with potassium supplementation.  Need to avoid  hypokalemia as a precipitant for dysrhythmia.  If blood pressure trends up, Aldactone could be added.  Medication Adjustments/Labs and Tests Ordered: Current medicines are reviewed at length with the patient today.  Concerns regarding medicines are outlined above.   Tests Ordered: Orders Placed This Encounter  Procedures   EKG 12-Lead    Medication Changes: No orders of the defined types were placed in this encounter.   Disposition:  Follow up  6 months.  Signed, Satira Sark, MD, [facility] 07/14/2020 4:03 PM    [place] at [place], [place], [place] [address] Phone: [phone]; Fax: [phone]

## 2020-07-14 NOTE — Patient Instructions (Signed)
Medication Instructions:   STOP HCTZ   STOP Potassium  *If you need a refill on your cardiac medications before your next appointment, please call your pharmacy*   Lab Work: None today  If you have labs (blood work) drawn today and your tests are completely normal, you will receive your results only by: [place] (if you have MyChart) OR A paper copy in the mail If you have any lab test that is abnormal or we need to change your treatment, we will call you to review the results.   Testing/Procedures: None today    Follow-Up: At [facility], you and your health needs are our priority.  As part of our continuing mission to provide you with exceptional heart care, we have created designated Provider Care Teams.  These Care Teams include your primary Cardiologist (physician) and Advanced Practice Providers (APPs -  Physician Assistants and Nurse Practitioners) who all work together to provide you with the care you need, when you need it.  We recommend signing up for the patient portal called "MyChart".  Sign up information is provided on this After Visit Summary.  MyChart is used to connect with patients for Virtual Visits (Telemedicine).  Patients are able to view lab/test results, encounter notes, upcoming appointments, etc.  Non-urgent messages can be sent to your provider as well.   To learn more about what you can do with MyChart, go to [email or web address].    Your next appointment:   6 month(s)  The format for your next appointment:   In Person  Provider:   Rozann Lesches, MD   Other Instructions None

## 2020-07-21 ENCOUNTER — Other Ambulatory Visit: Payer: Self-pay | Admitting: Cardiology

## 2020-07-29 ENCOUNTER — Telehealth: Payer: Self-pay | Admitting: Cardiology

## 2020-07-29 MED ORDER — METOPROLOL TARTRATE 25 MG PO TABS: 25.0000 mg | ORAL_TABLET | Freq: Every day | ORAL | 2 refills | Status: DC

## 2020-07-29 MED ORDER — FLECAINIDE ACETATE 50 MG PO TABS: ORAL_TABLET | ORAL | 2 refills | Status: DC

## 2020-07-29 NOTE — Addendum Note (Signed)
Addended by: Merlene Laughter on: 07/29/2020 11:17 AM   Modules accepted: Orders

## 2020-07-29 NOTE — Telephone Encounter (Signed)
New message   Patient was seen on 07/14/20    *STAT* If patient is at the pharmacy, call can be transferred to refill team.   1. Which medications need to be refilled? (please list name of each medication and dose if known)  metoprolol tartrate (LOPRESSOR) 25 MG tablet flecainide (TAMBOCOR) 50 MG tablet 2. Which pharmacy/location (including street and city if local pharmacy) is medication to be sent to? [facility]   3. Do they need a 30 day or 90 day supply? [place]

## 2020-08-14 ENCOUNTER — Ambulatory Visit
Admission: RE | Admit: 2020-08-14 | Discharge: 2020-08-14 | Disposition: A | Discharge status: Home / Self Care | Payer: Medicare PPO | Source: Ambulatory Visit | Attending: Obstetrics and Gynecology | Admitting: Obstetrics and Gynecology

## 2020-08-14 ENCOUNTER — Other Ambulatory Visit: Payer: Self-pay

## 2020-08-14 DIAGNOSIS — Z1231 Encounter for screening mammogram for malignant neoplasm of breast: Secondary | ICD-10-CM | POA: Diagnosis not present

## 2020-08-14 DIAGNOSIS — Z01419 Encounter for gynecological examination (general) (routine) without abnormal findings: Secondary | ICD-10-CM | POA: Diagnosis not present

## 2020-08-14 DIAGNOSIS — Z6830 Body mass index (BMI) 30.0-30.9, adult: Secondary | ICD-10-CM | POA: Diagnosis not present

## 2020-08-28 DIAGNOSIS — L820 Inflamed seborrheic keratosis: Secondary | ICD-10-CM | POA: Diagnosis not present

## 2020-08-28 DIAGNOSIS — D485 Neoplasm of uncertain behavior of skin: Secondary | ICD-10-CM | POA: Diagnosis not present

## 2020-08-28 DIAGNOSIS — C44622 Squamous cell carcinoma of skin of right upper limb, including shoulder: Secondary | ICD-10-CM | POA: Diagnosis not present

## 2020-08-28 DIAGNOSIS — L570 Actinic keratosis: Secondary | ICD-10-CM | POA: Diagnosis not present

## 2020-08-28 DIAGNOSIS — Z85828 Personal history of other malignant neoplasm of skin: Secondary | ICD-10-CM | POA: Diagnosis not present

## 2020-09-01 DIAGNOSIS — Z85828 Personal history of other malignant neoplasm of skin: Secondary | ICD-10-CM | POA: Diagnosis not present

## 2020-09-01 DIAGNOSIS — S40862A Insect bite (nonvenomous) of left upper arm, initial encounter: Secondary | ICD-10-CM | POA: Diagnosis not present

## 2020-09-26 DIAGNOSIS — E039 Hypothyroidism, unspecified: Secondary | ICD-10-CM | POA: Diagnosis not present

## 2020-09-26 DIAGNOSIS — E7801 Familial hypercholesterolemia: Secondary | ICD-10-CM | POA: Diagnosis not present

## 2020-09-26 DIAGNOSIS — E1165 Type 2 diabetes mellitus with hyperglycemia: Secondary | ICD-10-CM | POA: Diagnosis not present

## 2020-09-26 DIAGNOSIS — I10 Essential (primary) hypertension: Secondary | ICD-10-CM | POA: Diagnosis not present

## 2020-09-26 DIAGNOSIS — E7800 Pure hypercholesterolemia, unspecified: Secondary | ICD-10-CM | POA: Diagnosis not present

## 2020-09-29 DIAGNOSIS — I10 Essential (primary) hypertension: Secondary | ICD-10-CM | POA: Diagnosis not present

## 2020-09-29 DIAGNOSIS — E876 Hypokalemia: Secondary | ICD-10-CM | POA: Diagnosis not present

## 2020-09-29 DIAGNOSIS — Z1331 Encounter for screening for depression: Secondary | ICD-10-CM | POA: Diagnosis not present

## 2020-09-29 DIAGNOSIS — I472 Ventricular tachycardia: Secondary | ICD-10-CM | POA: Diagnosis not present

## 2020-09-29 DIAGNOSIS — E1165 Type 2 diabetes mellitus with hyperglycemia: Secondary | ICD-10-CM | POA: Diagnosis not present

## 2020-09-29 DIAGNOSIS — Z23 Encounter for immunization: Secondary | ICD-10-CM | POA: Diagnosis not present

## 2020-09-29 DIAGNOSIS — D49511 Neoplasm of unspecified behavior of right kidney: Secondary | ICD-10-CM | POA: Diagnosis not present

## 2020-09-29 DIAGNOSIS — Z1389 Encounter for screening for other disorder: Secondary | ICD-10-CM | POA: Diagnosis not present

## 2020-10-07 DIAGNOSIS — C44529 Squamous cell carcinoma of skin of other part of trunk: Secondary | ICD-10-CM | POA: Diagnosis not present

## 2020-10-07 DIAGNOSIS — Z85828 Personal history of other malignant neoplasm of skin: Secondary | ICD-10-CM | POA: Diagnosis not present

## 2020-10-07 DIAGNOSIS — D485 Neoplasm of uncertain behavior of skin: Secondary | ICD-10-CM | POA: Diagnosis not present

## 2020-10-10 DIAGNOSIS — Z20828 Contact with and (suspected) exposure to other viral communicable diseases: Secondary | ICD-10-CM | POA: Diagnosis not present

## 2020-10-10 DIAGNOSIS — J069 Acute upper respiratory infection, unspecified: Secondary | ICD-10-CM | POA: Diagnosis not present

## 2020-10-15 ENCOUNTER — Telehealth: Payer: Self-pay | Admitting: Cardiology

## 2020-10-15 NOTE — Telephone Encounter (Signed)
   Name: Cristina Douglas  DOB: 1935-05-30  MRN: [id number]   Primary Cardiologist: Rozann Lesches, MD  Chart reviewed as part of pre-operative protocol coverage. Patient was contacted 10/15/2020 in reference to pre-operative risk assessment for pending surgery as outlined below.  Deloise Marchant Adelstein was last seen on 07/14/2020 by Dr. Domenic Polite.  Since that day, Manessa Buley Preslar has done well without significant chest pain or worsening dyspnea. Cardiac catherization performed at [facility] on 10/29/2019 showed clean coronary arteries.   Therefore, based on ACC/AHA guidelines, the patient would be at acceptable risk for the planned procedure without further cardiovascular testing.   The patient was advised that if she develops new symptoms prior to surgery to contact our office to arrange for a follow-up visit, and she verbalized understanding.  I will route this recommendation to the requesting party via Epic fax function and remove from pre-op pool. Please call with questions.  [place], [place] 10/15/2020, 6:17 PM

## 2020-10-15 NOTE — Telephone Encounter (Signed)
   [facility] Pre-operative Risk Assessment    Patient Name: Cristina Douglas  DOB: 03-04-1935 MRN: [id number]  HEARTCARE STAFF:  - IMPORTANT!!!!!! Under Visit Info/Reason for Call, type in Other and utilize the format Clearance MM/DD/YY or Clearance TBD. Do not use dashes or single digits. - Please review there is not already an duplicate clearance open for this procedure. - If request is for dental extraction, please clarify the # of teeth to be extracted. - If the patient is currently at the dentist's office, call Pre-Op Callback Staff (MA/nurse) to input urgent request.  - If the patient is not currently in the dentist office, please route to the Pre-Op pool.  Request for surgical clearance:  What type of surgery is being performed Rt Reverse Shoulder Arthoplasty   When is this surgery scheduled? TBD  What type of clearance is required (medical clearance vs. Pharmacy clearance to hold med vs. Both)? both  Are there any medications that need to be held prior to surgery and how long? Please advise   Practice name and name of physician performing surgery? Dr Justice Britain at [facility]  What is the office phone number? [phone]    7.   What is the office fax number? [place]   Anesthesia type (None, local, MAC, general) ?  Not Specified   Jannet Askew 10/15/2020, 9:16 AM  _________________________________________________________________   (provider comments below)

## 2020-11-10 ENCOUNTER — Ambulatory Visit: Payer: Medicare PPO | Admitting: Internal Medicine

## 2020-11-10 DIAGNOSIS — I4729 Other ventricular tachycardia: Secondary | ICD-10-CM

## 2020-11-10 DIAGNOSIS — E785 Hyperlipidemia, unspecified: Secondary | ICD-10-CM

## 2020-11-10 DIAGNOSIS — I10 Essential (primary) hypertension: Secondary | ICD-10-CM

## 2020-11-17 ENCOUNTER — Telehealth: Payer: Self-pay | Admitting: Cardiology

## 2020-11-17 NOTE — Telephone Encounter (Signed)
Patient called in this morning reporting her BP has been elevated. Husband has been sick in the hospital at [facility] recently. Has been under stress related to this. Says her BP has been up last evening and again this morning. Readings: 164/79 HR 67, 154/75 HR 64. No chest pain, palpitations or chest pain with the elevated readings. Wanting to be seen in the office to further discuss medication adjustments if readings remain elevated. Informed I will route to triage to follow up with the patient.

## 2020-11-17 NOTE — Telephone Encounter (Signed)
Pt wanted to confirm appt for 11/18. Pt also requested to have EKG performed at appt. Will put in notes.

## 2020-11-17 NOTE — Telephone Encounter (Signed)
Patient calling back to speak with a nurse. She has an appointment 11/18.

## 2020-11-20 NOTE — Progress Notes (Signed)
Cardiology Office Note  Date: 11/21/2020   ID: Cristina Douglas, DOB [birth year], MRN [id number]  PCP:  Curlene Labrum, MD  Cardiologist:  Rozann Lesches, MD Electrophysiologist:  None   Chief Complaint: Elevated BP.  Patient requests EKG  History of Present Illness: Cristina Douglas is a 85 y.o. female with a history of HTN, orthostatic hypotension, HLD, palpitations, mitral regurgitation, history of breast CA.  She was last seen by Dr. Domenic Polite on 07/14/2020 for routine visit.  She had no chest pain, palpitations, or syncope.  EKG demonstrated normal sinus rhythm with decreased R wave progression.  She had not been consistent with potassium supplement.  Her blood pressure had been overall well controlled.  Stopping HCTZ was discussed.  Dr. Domenic Polite mentioned if blood pressure trended up Aldactone could be considered.  Potassium and HCTZ were stopped.  Telephone encounter on 11/17/2020 patient called reporting her blood pressures had been elevated on the prior evening and again the next morning.  Blood pressure readings were 164/79 with a heart rate of 67 the prior evening.  The next day blood pressure was 154/75 with a heart rate of 64.  She denies any chest pain, palpitations.  She is here today with continued complaints of blood pressure being elevated.  We discussed starting Aldactone which Dr. Domenic Polite had suggested at prior visit.  She is willing to try the medication.  Otherwise she denies any other cardiac issues.   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   Cataract    Diverticulitis    Diverticulosis    Essential hypertension    Hyperlipidemia    Hypothyroidism    PVC's (premature ventricular contractions)    Type 2 diabetes mellitus ([place])    Ventricular tachycardia    Suspected RVOT VT, normal coronaries October 2021    Past Surgical History:  Procedure  Laterality Date   APPENDECTOMY     BREAST BIOPSY Right 04/10/2013   BREAST LUMPECTOMY Right 05/09/2013   BREAST LUMPECTOMY WITH RADIOACTIVE SEED LOCALIZATION Right 05/09/2013   Procedure: BREAST LUMPECTOMY WITH RADIOACTIVE SEED LOCALIZATION;  Surgeon: Adin Hector, MD;  Location: [facility];  Service: General;  Laterality: Right;   COLONOSCOPY     ESOPHAGOGASTRODUODENOSCOPY N/A 11/14/2019   Procedure: ESOPHAGOGASTRODUODENOSCOPY (EGD);  Surgeon: Carol Ada, MD;  Location: [facility];  Service: Endoscopy;  Laterality: N/A;   EYE SURGERY     both cataracts   FOREIGN BODY REMOVAL  11/14/2019   Procedure: FOREIGN BODY REMOVAL;  Surgeon: Carol Ada, MD;  Location: [facility];  Service: Endoscopy;;   TONSILLECTOMY     TUBAL LIGATION      Current Outpatient Medications  Medication Sig Dispense Refill   atorvastatin (LIPITOR) 80 MG tablet Take 80 mg by mouth at bedtime.      Boswellia-Glucosamine-Vit D (OSTEO BI-FLEX ONE PER DAY PO) Take by mouth.     Calcium Carbonate-Vitamin D 600-400 MG-UNIT tablet Take 1 tablet by mouth daily.     cholecalciferol (VITAMIN D) 1000 UNITS tablet Take 1,000 Units by mouth daily.       Coenzyme Q10 (CO Q 10) 100 MG CAPS Take 100 mg by mouth daily.      Dapagliflozin-metFORMIN HCl ER (XIGDUO XR) 5-500 MG TB24 Take 1 tablet by mouth daily.     diclofenac Sodium (VOLTAREN) 1 % GEL Apply 4 g topically 4 (  four) times daily as needed.     flecainide (TAMBOCOR) 50 MG tablet TAKE 1 TABLET BY MOUTH EVERY TWELVE HOURS 180 tablet 2   levothyroxine (SYNTHROID, LEVOTHROID) 88 MCG tablet Take 88 mcg by mouth daily before breakfast.      lidocaine (XYLOCAINE) 5 % ointment Apply 1 application topically as needed.     losartan-hydrochlorothiazide (HYZAAR) 50-12.5 MG tablet Take 1 tablet by mouth 2 (two) times daily.     MAGNESIUM OXIDE PO Take 1 tablet by mouth at bedtime.     metoprolol tartrate (LOPRESSOR) 25 MG tablet Take 1 tablet (25 mg total) by  mouth daily. 90 tablet 2   Misc Natural Products (TURMERIC CURCUMIN) CAPS Take 2 capsules by mouth daily.     Multiple Vitamin (MULTIVITAMIN) tablet Take 1 tablet by mouth daily.       NON FORMULARY as needed. [place]     spironolactone (ALDACTONE) 25 MG tablet Take 0.5 tablets (12.5 mg total) by mouth daily. 45 tablet 3   No current facility-administered medications for this visit.   Allergies:  Sulfa antibiotics   Social History: The patient  reports that she has never smoked. She has never used smokeless tobacco. She reports current alcohol use. She reports that she does not use drugs.   Family History: The patient's family history includes Lung cancer in her paternal aunt.   ROS:  Please see the history of present illness. Otherwise, complete review of systems is positive for none.  All other systems are reviewed and negative.   Physical Exam: VS:  BP 140/66   Pulse 62   Ht 5\' 3"  (1.6 m)   Wt 168 lb 3.2 oz (76.3 kg)   SpO2 97%   BMI 29.80 kg/m , BMI Body mass index is 29.8 kg/m.  Wt Readings from Last 3 Encounters:  11/21/20 168 lb 3.2 oz (76.3 kg)  07/14/20 168 lb (76.2 kg)  01/14/20 167 lb (75.8 kg)    General: Patient appears comfortable at rest. Neck: Supple, no elevated JVP or carotid bruits, no thyromegaly. Lungs: Clear to auscultation, nonlabored breathing at rest. Cardiac: Regular rate and rhythm, no S3 or significant systolic murmur, no pericardial rub. Extremities: No pitting edema, distal pulses 2+. Skin: Warm and dry. Musculoskeletal: No kyphosis. Neuropsychiatric: Alert and oriented x3, affect grossly appropriate.  ECG: 11/21/2020 normal sinus rhythm rate of 63, septal infarct, age undetermined.  Recent Labwork: No results found for requested labs within last 8760 hours.  No results found for: CHOL, TRIG, HDL, CHOLHDL, VLDL, LDLCALC, LDLDIRECT  Other Studies Reviewed Today:   Cardiac monitor [facility] healthcare 11/21/2019 Indication: Ventricular  tachycardia   Conclusions:  Analysis time:  13 days, 15 hours   Patient had a min HR of 47 bpm, max HR of 129 bpm, and avg HR of 64 bpm.   Predominant underlying rhythm was Sinus Rhythm.   3 Supraventricular Tachycardia runs occurred, the run with the fastest interval lasting 5 beats with a max rate of 129 bpm (avg 98 bpm); the run with the fastest interval was also the longest.   Isolated SVEs were rare (<1.0%), SVE Couplets were rare (<1.0%), and SVE Triplets were rare (<1.0%).   Isolated VEs were rare (<1.0%), and no VE Couplets or VE Triplets were present.   Patient triggered event correlated to sinus rhythm.  Please see attached document for details of the patient triggered events and diary entries.      Echocardiogram 10/28/2019 [facility]): Summary  1. Technically difficult study.    2. The left ventricle is normal in size with normal wall thickness.    3. The left ventricular systolic function is normal, LVEF is visually  estimated at > 55%.    4. The right ventricle is normal in size, with normal systolic function.    5. There is mild pulmonary hypertension, estimated pulmonary artery systolic  pressure is 33 mmHg.     Cardiac catheterization 10/29/2019 ([place]):  Findings:  1. Normal coronaries.  2. Low-normal left ventricular filling pressures (LVEDP = 10 mm Hg).  3. Normal left ventricular systolic function (estimated LVEF = 55 %).   Recommendations:  1. Follow up with primary cardiologist.       Assessment and Plan:  1. 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 Essential hypertension    1. 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  Continue flecainide 50 mg every 12 hours.  EKG today shows normal sinus rhythm rate of 63, septal infarct age undetermined.  2. Essential hypertension BP elevated today at 140/66.  She  recently had blood pressures at home that were higher.  Continue losartan 50 mg p.o. twice daily.  Continue metoprolol 25 mg p.o. twice daily.  Add Aldactone 12.5 mg p.o. daily.  Get BMP and magnesium in 2 weeks.  Call with blood pressure update and 1 to 2 weeks.  May need to adjust Aldactone pending on blood pressures and lab results.  Medication Adjustments/Labs and Tests Ordered: Current medicines are reviewed at length with the patient today.  Concerns regarding medicines are outlined above.   Disposition: Follow-up with Dr. Domenic Polite or APP 35-month  Signed, Levell July, NP 11/21/2020 10:10 AM    [place] at [place], [place], [place] [address] Phone: [phone]; Fax: [phone]

## 2020-11-21 ENCOUNTER — Encounter: Payer: Self-pay | Admitting: Family Medicine

## 2020-11-21 ENCOUNTER — Ambulatory Visit: Payer: Medicare PPO | Admitting: Family Medicine

## 2020-11-21 VITALS — BP 140/66 | HR 62 | Ht 63.0 in | Wt 168.2 lb

## 2020-11-21 DIAGNOSIS — I10 Essential (primary) hypertension: Secondary | ICD-10-CM

## 2020-11-21 DIAGNOSIS — I4729 Other ventricular tachycardia: Secondary | ICD-10-CM

## 2020-11-21 MED ORDER — SPIRONOLACTONE 25 MG PO TABS: 12.5000 mg | ORAL_TABLET | Freq: Every day | ORAL | 3 refills | Status: DC

## 2020-11-21 NOTE — Patient Instructions (Signed)
Medication Instructions:  Your physician has recommended you make the following change in your medication:  START Aldactone 12.5 mg tablets daily  *If you need a refill on your cardiac medications before your next appointment, please call your pharmacy*   Lab Work: 2 WEEKS: BMET MAG If you have labs (blood work) drawn today and your tests are completely normal, you will receive your results only by: [place] (if you have MyChart) OR A paper copy in the mail If you have any lab test that is abnormal or we need to change your treatment, we will call you to review the results.   Testing/Procedures: None   Follow-Up: At [facility], you and your health needs are our priority.  As part of our continuing mission to provide you with exceptional heart care, we have created designated Provider Care Teams.  These Care Teams include your primary Cardiologist (physician) and Advanced Practice Providers (APPs -  Physician Assistants and Nurse Practitioners) who all work together to provide you with the care you need, when you need it.  We recommend signing up for the patient portal called "MyChart".  Sign up information is provided on this After Visit Summary.  MyChart is used to connect with patients for Virtual Visits (Telemedicine).  Patients are able to view lab/test results, encounter notes, upcoming appointments, etc.  Non-urgent messages can be sent to your provider as well.   To learn more about what you can do with MyChart, go to [email or web address].    Your next appointment:   6 month(s)  The format for your next appointment:   In Person  Provider:   Rozann Lesches, MD    Other Instructions Call our office with BP log in 1-2 weeks.

## 2020-11-26 ENCOUNTER — Other Ambulatory Visit: Payer: Medicare PPO

## 2020-11-26 ENCOUNTER — Other Ambulatory Visit: Payer: Self-pay

## 2020-11-26 ENCOUNTER — Ambulatory Visit
Admission: RE | Admit: 2020-11-26 | Discharge: 2020-11-26 | Disposition: A | Discharge status: Home / Self Care | Payer: Medicare PPO | Source: Ambulatory Visit | Attending: Urology | Admitting: Urology

## 2020-11-26 DIAGNOSIS — D49511 Neoplasm of unspecified behavior of right kidney: Secondary | ICD-10-CM

## 2020-11-26 DIAGNOSIS — N281 Cyst of kidney, acquired: Secondary | ICD-10-CM | POA: Diagnosis not present

## 2020-11-26 DIAGNOSIS — N2889 Other specified disorders of kidney and ureter: Secondary | ICD-10-CM | POA: Diagnosis not present

## 2020-11-26 MED ORDER — GADOBENATE DIMEGLUMINE 529 MG/ML IV SOLN
15.0000 mL | Freq: Once | INTRAVENOUS | Status: AC | PRN
  Administered 2020-11-26: 15 mL via INTRAVENOUS

## 2020-12-03 DIAGNOSIS — D49511 Neoplasm of unspecified behavior of right kidney: Secondary | ICD-10-CM | POA: Diagnosis not present

## 2020-12-10 DIAGNOSIS — D49511 Neoplasm of unspecified behavior of right kidney: Secondary | ICD-10-CM | POA: Diagnosis not present

## 2020-12-10 DIAGNOSIS — N3946 Mixed incontinence: Secondary | ICD-10-CM | POA: Diagnosis not present

## 2020-12-11 DIAGNOSIS — E1165 Type 2 diabetes mellitus with hyperglycemia: Secondary | ICD-10-CM | POA: Diagnosis not present

## 2020-12-11 DIAGNOSIS — I472 Ventricular tachycardia, unspecified: Secondary | ICD-10-CM | POA: Diagnosis not present

## 2020-12-11 DIAGNOSIS — E7800 Pure hypercholesterolemia, unspecified: Secondary | ICD-10-CM | POA: Diagnosis not present

## 2020-12-11 DIAGNOSIS — C649 Malignant neoplasm of unspecified kidney, except renal pelvis: Secondary | ICD-10-CM | POA: Diagnosis not present

## 2020-12-11 DIAGNOSIS — E039 Hypothyroidism, unspecified: Secondary | ICD-10-CM | POA: Diagnosis not present

## 2020-12-11 DIAGNOSIS — E876 Hypokalemia: Secondary | ICD-10-CM | POA: Diagnosis not present

## 2020-12-11 DIAGNOSIS — I10 Essential (primary) hypertension: Secondary | ICD-10-CM | POA: Diagnosis not present

## 2020-12-11 DIAGNOSIS — M899 Disorder of bone, unspecified: Secondary | ICD-10-CM | POA: Diagnosis not present

## 2020-12-26 DIAGNOSIS — M545 Low back pain, unspecified: Secondary | ICD-10-CM | POA: Diagnosis not present

## 2020-12-26 DIAGNOSIS — M25551 Pain in right hip: Secondary | ICD-10-CM | POA: Diagnosis not present

## 2021-01-15 ENCOUNTER — Telehealth: Payer: Self-pay | Admitting: Cardiology

## 2021-01-15 NOTE — Telephone Encounter (Signed)
Pt c/o medication issue:  1. Name of Medication: ALDACTONE 25MG   2. How are you currently taking this medication (dosage and times per day)? NO 3. Are you having a reaction (difficulty breathing--STAT)?  NO 4. What is your medication issue? Patient was  told by Dr. Junius Argyle that if she takes this medication she will need to drink a lot of water.  Patient states that she knew she would not be able to drink that amount of water daily. States that took the medication for 2 weeks and stopped it.

## 2021-01-16 NOTE — Telephone Encounter (Signed)
No answer

## 2021-01-20 ENCOUNTER — Ambulatory Visit: Payer: Medicare PPO | Admitting: Cardiology

## 2021-01-21 DIAGNOSIS — R197 Diarrhea, unspecified: Secondary | ICD-10-CM | POA: Diagnosis not present

## 2021-01-26 DIAGNOSIS — Z8616 Personal history of COVID-19: Secondary | ICD-10-CM | POA: Diagnosis not present

## 2021-01-26 DIAGNOSIS — J301 Allergic rhinitis due to pollen: Secondary | ICD-10-CM | POA: Diagnosis not present

## 2021-01-26 DIAGNOSIS — I472 Ventricular tachycardia, unspecified: Secondary | ICD-10-CM | POA: Diagnosis not present

## 2021-01-26 DIAGNOSIS — Z6829 Body mass index (BMI) 29.0-29.9, adult: Secondary | ICD-10-CM | POA: Diagnosis not present

## 2021-01-26 DIAGNOSIS — E039 Hypothyroidism, unspecified: Secondary | ICD-10-CM | POA: Diagnosis not present

## 2021-01-26 DIAGNOSIS — E1165 Type 2 diabetes mellitus with hyperglycemia: Secondary | ICD-10-CM | POA: Diagnosis not present

## 2021-01-26 DIAGNOSIS — J019 Acute sinusitis, unspecified: Secondary | ICD-10-CM | POA: Diagnosis not present

## 2021-01-26 NOTE — Telephone Encounter (Signed)
° °  Pt is returning, she said to call on her home# [phone]

## 2021-01-26 NOTE — Telephone Encounter (Signed)
Left a message for pt to call office

## 2021-01-26 NOTE — Telephone Encounter (Signed)
Patient's husband is returning call. 

## 2021-01-26 NOTE — Telephone Encounter (Signed)
Pt notified and verbalized understanding of Dr. Myles Gip advice.

## 2021-02-05 DIAGNOSIS — Z961 Presence of intraocular lens: Secondary | ICD-10-CM | POA: Diagnosis not present

## 2021-02-05 DIAGNOSIS — H5212 Myopia, left eye: Secondary | ICD-10-CM | POA: Diagnosis not present

## 2021-02-05 DIAGNOSIS — H35033 Hypertensive retinopathy, bilateral: Secondary | ICD-10-CM | POA: Diagnosis not present

## 2021-02-05 DIAGNOSIS — E119 Type 2 diabetes mellitus without complications: Secondary | ICD-10-CM | POA: Diagnosis not present

## 2021-02-05 DIAGNOSIS — H5201 Hypermetropia, right eye: Secondary | ICD-10-CM | POA: Diagnosis not present

## 2021-02-05 DIAGNOSIS — I10 Essential (primary) hypertension: Secondary | ICD-10-CM | POA: Diagnosis not present

## 2021-02-05 DIAGNOSIS — H2700 Aphakia, unspecified eye: Secondary | ICD-10-CM | POA: Diagnosis not present

## 2021-02-05 DIAGNOSIS — H52223 Regular astigmatism, bilateral: Secondary | ICD-10-CM | POA: Diagnosis not present

## 2021-03-21 ENCOUNTER — Other Ambulatory Visit: Payer: Self-pay | Admitting: Cardiology

## 2021-03-26 DIAGNOSIS — Z23 Encounter for immunization: Secondary | ICD-10-CM | POA: Diagnosis not present

## 2021-03-31 DIAGNOSIS — I10 Essential (primary) hypertension: Secondary | ICD-10-CM | POA: Diagnosis not present

## 2021-03-31 DIAGNOSIS — E7800 Pure hypercholesterolemia, unspecified: Secondary | ICD-10-CM | POA: Diagnosis not present

## 2021-03-31 DIAGNOSIS — E039 Hypothyroidism, unspecified: Secondary | ICD-10-CM | POA: Diagnosis not present

## 2021-03-31 DIAGNOSIS — E7801 Familial hypercholesterolemia: Secondary | ICD-10-CM | POA: Diagnosis not present

## 2021-03-31 DIAGNOSIS — E1165 Type 2 diabetes mellitus with hyperglycemia: Secondary | ICD-10-CM | POA: Diagnosis not present

## 2021-04-06 DIAGNOSIS — I472 Ventricular tachycardia, unspecified: Secondary | ICD-10-CM | POA: Diagnosis not present

## 2021-04-06 DIAGNOSIS — N3946 Mixed incontinence: Secondary | ICD-10-CM | POA: Diagnosis not present

## 2021-04-06 DIAGNOSIS — E1165 Type 2 diabetes mellitus with hyperglycemia: Secondary | ICD-10-CM | POA: Diagnosis not present

## 2021-04-06 DIAGNOSIS — E876 Hypokalemia: Secondary | ICD-10-CM | POA: Diagnosis not present

## 2021-04-06 DIAGNOSIS — E039 Hypothyroidism, unspecified: Secondary | ICD-10-CM | POA: Diagnosis not present

## 2021-04-06 DIAGNOSIS — I10 Essential (primary) hypertension: Secondary | ICD-10-CM | POA: Diagnosis not present

## 2021-04-06 DIAGNOSIS — Z0001 Encounter for general adult medical examination with abnormal findings: Secondary | ICD-10-CM | POA: Diagnosis not present

## 2021-04-06 DIAGNOSIS — D49511 Neoplasm of unspecified behavior of right kidney: Secondary | ICD-10-CM | POA: Diagnosis not present

## 2021-05-13 DIAGNOSIS — L570 Actinic keratosis: Secondary | ICD-10-CM | POA: Diagnosis not present

## 2021-05-13 DIAGNOSIS — Z85828 Personal history of other malignant neoplasm of skin: Secondary | ICD-10-CM | POA: Diagnosis not present

## 2021-05-13 DIAGNOSIS — L820 Inflamed seborrheic keratosis: Secondary | ICD-10-CM | POA: Diagnosis not present

## 2021-05-13 DIAGNOSIS — L821 Other seborrheic keratosis: Secondary | ICD-10-CM | POA: Diagnosis not present

## 2021-06-02 NOTE — Progress Notes (Unsigned)
Cardiology Office Note  Date: 06/03/2021   ID: Cristina Douglas, DOB 1935/07/08, MRN [id number]  PCP:  Curlene Labrum, MD  Cardiologist:  Rozann Lesches, MD Electrophysiologist:  None   Chief Complaint  Patient presents with   Cardiac follow-up    History of Present Illness: Cristina Douglas is an 86 y.o. female last seen in November 2022 by Mr. Leonides Sake NP.  She is here with her husband for a routine follow-up visit.  States that she has been doing fairly well from a cardiac perspective, does not report any sense of palpitations or chest pain, has had no syncope.  She indicates compliance with her medications as outlined below.  Still maintaining regular follow-up with Dr. Pleas Koch, we are requesting interval lab work for review.  Past Medical History:  Diagnosis Date   Basal cell carcinoma    Breast cancer ([place])    DCIS right breast status post radiation and lumpectomy.   Cataract    Diverticulitis    Diverticulosis    Essential hypertension    Hyperlipidemia    Hypothyroidism    PVC's (premature ventricular contractions)    Type 2 diabetes mellitus ([place])    Ventricular tachycardia ([place])    Suspected RVOT VT, normal coronaries October 2021    Past Surgical History:  Procedure Laterality Date   APPENDECTOMY     BREAST BIOPSY Right 04/10/2013   BREAST LUMPECTOMY Right 05/09/2013   BREAST LUMPECTOMY WITH RADIOACTIVE SEED LOCALIZATION Right 05/09/2013   Procedure: BREAST LUMPECTOMY WITH RADIOACTIVE SEED LOCALIZATION;  Surgeon: Adin Hector, MD;  Location: [place];  Service: General;  Laterality: Right;   COLONOSCOPY     ESOPHAGOGASTRODUODENOSCOPY N/A 11/14/2019   Procedure: ESOPHAGOGASTRODUODENOSCOPY (EGD);  Surgeon: Carol Ada, MD;  Location: [place];  Service: Endoscopy;  Laterality: N/A;   EYE SURGERY     both cataracts   FOREIGN BODY REMOVAL  11/14/2019   Procedure: FOREIGN BODY REMOVAL;  Surgeon: Carol Ada, MD;  Location: [facility];  Service: Endoscopy;;   TONSILLECTOMY     TUBAL LIGATION      Current Outpatient Medications  Medication Sig Dispense Refill   acetaminophen (TYLENOL) 650 MG CR tablet Take 650 mg by mouth every 8 (eight) hours as needed for pain.     atorvastatin (LIPITOR) 80 MG tablet Take 80 mg by mouth at bedtime.      Calcium Carbonate-Vitamin D 600-400 MG-UNIT tablet Take 1 tablet by mouth daily.     cholecalciferol (VITAMIN D) 1000 UNITS tablet Take 1,000 Units by mouth daily.       Coenzyme Q10 (CO Q 10) 100 MG CAPS Take 50 mg by mouth daily.     Dapagliflozin-metFORMIN HCl ER (XIGDUO XR) 5-500 MG TB24 Take 1 tablet by mouth daily.     diclofenac Sodium (VOLTAREN) 1 % GEL Apply 4 g topically 4 (four) times daily as needed.     flecainide (TAMBOCOR) 50 MG tablet TAKE 1 TABLET BY MOUTH EVERY TWELVE HOURS 180 tablet 2   levothyroxine (SYNTHROID, LEVOTHROID) 88 MCG tablet Take 88 mcg by mouth daily before breakfast.      lidocaine (XYLOCAINE) 5 % ointment Apply 1 application topically as needed.     losartan-hydrochlorothiazide (HYZAAR) 50-12.5 MG tablet Take 1 tablet by mouth 2 (two) times daily.     MAGNESIUM OXIDE PO Take 1 tablet by mouth at bedtime.     metoprolol tartrate (LOPRESSOR) 25 MG tablet Take 1 tablet (25 mg total) by  mouth daily. 90 tablet 2   Misc Natural Products (TURMERIC CURCUMIN) CAPS Take 2 capsules by mouth daily.     Multiple Vitamin (MULTIVITAMIN) tablet Take 1 tablet by mouth daily.       NON FORMULARY as needed. Salon Pas     spironolactone (ALDACTONE) 25 MG tablet Take 0.5 tablets (12.5 mg total) by mouth daily. 45 tablet 3   Boswellia-Glucosamine-Vit D (OSTEO BI-FLEX ONE PER DAY PO) Take by mouth. (Patient not taking: Reported on 06/03/2021)     No current facility-administered medications for this visit.   Allergies:  Sulfa antibiotics   ROS: No orthopnea or PND.  Physical Exam: VS:  BP 130/76   Pulse 60   Ht '5\' 3"'$  (1.6 m)   Wt 165 lb 6.4 oz (75 kg)    SpO2 96%   BMI 29.30 kg/m , BMI Body mass index is 29.3 kg/m.  Wt Readings from Last 3 Encounters:  06/03/21 165 lb 6.4 oz (75 kg)  11/21/20 168 lb 3.2 oz (76.3 kg)  07/14/20 168 lb (76.2 kg)    General: Patient appears comfortable at rest. HEENT: Conjunctiva and lids normal. Neck: Supple, no elevated JVP or carotid bruits, no thyromegaly. Lungs: Clear to auscultation, nonlabored breathing at rest. Cardiac: Regular rate and rhythm, no S3 or significant systolic murmur, no pericardial rub. Extremities: No pitting edema.  ECG:  An ECG dated 11/21/2020 was personally reviewed today and demonstrated:  Sinus rhythm.  Recent Labwork:  June 2022: AST 21, ALT 14, cholesterol 178, triglycerides 139, HDL 47, LDL 103, potassium 4.2, BUN 18, creatinine 0.66, hemoglobin A1c 7.5%, TSH 1.58  Other Studies Reviewed Today:  Echocardiogram 10/28/2019 [facility]): Summary    1. Technically difficult study.    2. The left ventricle is normal in size with normal wall thickness.    3. The left ventricular systolic function is normal, LVEF is visually  estimated at > 55%.    4. The right ventricle is normal in size, with normal systolic function.    5. There is mild pulmonary hypertension, estimated pulmonary artery systolic  pressure is 33 mmHg.     Cardiac catheterization 10/29/2019 [facility]): Hemodynamics and Left Heart Catheterization  Aortic pressure: 131/60 mm Hg (mean 89 mm Hg)  LVEDP = 10 mm Hg Left Ventriculogram  RAO Left Ventriculogram:Normal  Ejection Fraction (visual estimate): 55% Coronary Angiography Dominance: Right Left Main: The left main coronary artery (LMCA) is a large-caliber vessel that originates from the left coronary sinus. It bifurcates into the left anterior descending (LAD) and left circumflex (LCx) arteries. There is no angiographic evidence of significant disease in the LMCA. LAD: The LAD is a large-caliber vessel that gives off 2 diagonal (D) branches  before it wraps around the apex. D1 is a large-caliber vessel. D2 is a small-caliber vessel. There is no angiographic evidence of significant disease in the LAD. Left Circumflex: The LCx is a large-caliber vessel that gives off 3 obtuse marginal (OM) branches and then continues as a small vessel in the AV groove. OM1 is a very small-caliber vessel. OM2 is a very small-caliber vessel. OM3 is a large-caliber trifurcating vessel. There is no angiographic evidence of significant disease in the LCx. Right Coronary: The right coronary artery (RCA) is a large-caliber vessel originating from the right coronary sinus. It bifurcates distally into a moderate-caliber posterior descending artery (PDA) and moderate posterolateral (PL) branches consistent with a right dominant system. There is no angiographic evidence of significant disease in the  RCA.  Assessment and Plan:  1.  History of RVOT VT.  She is doing well without obvious recurrences on flecainide and Lopressor.  I reviewed her most recent ECG.  2.  Essential hypertension, blood pressure control is reasonable today on current regimen including Aldactone, Hyzaar, and Lopressor.  Requesting interval lab work from Dr. Pleas Koch for review.  Medication Adjustments/Labs and Tests Ordered: Current medicines are reviewed at length with the patient today.  Concerns regarding medicines are outlined above.   Tests Ordered: No orders of the defined types were placed in this encounter.   Medication Changes: No orders of the defined types were placed in this encounter.   Disposition:  Follow up  6 months.  Signed, Satira Sark, MD, [facility] 06/03/2021 11:45 AM    [facility] [place], [place], [place] [address] Phone: [phone]; Fax: [phone]

## 2021-06-03 ENCOUNTER — Ambulatory Visit: Payer: Medicare PPO | Admitting: Cardiology

## 2021-06-03 ENCOUNTER — Encounter: Payer: Self-pay | Admitting: Cardiology

## 2021-06-03 ENCOUNTER — Encounter: Payer: Self-pay | Admitting: *Deleted

## 2021-06-03 VITALS — BP 130/76 | HR 60 | Ht 63.0 in | Wt 165.4 lb

## 2021-06-03 DIAGNOSIS — I10 Essential (primary) hypertension: Secondary | ICD-10-CM

## 2021-06-03 DIAGNOSIS — I4729 Other ventricular tachycardia: Secondary | ICD-10-CM | POA: Diagnosis not present

## 2021-06-03 NOTE — Patient Instructions (Addendum)

## 2021-06-11 DIAGNOSIS — I10 Essential (primary) hypertension: Secondary | ICD-10-CM | POA: Diagnosis not present

## 2021-06-11 DIAGNOSIS — E876 Hypokalemia: Secondary | ICD-10-CM | POA: Diagnosis not present

## 2021-06-11 DIAGNOSIS — C649 Malignant neoplasm of unspecified kidney, except renal pelvis: Secondary | ICD-10-CM | POA: Diagnosis not present

## 2021-06-11 DIAGNOSIS — E1165 Type 2 diabetes mellitus with hyperglycemia: Secondary | ICD-10-CM | POA: Diagnosis not present

## 2021-06-11 DIAGNOSIS — E039 Hypothyroidism, unspecified: Secondary | ICD-10-CM | POA: Diagnosis not present

## 2021-06-11 DIAGNOSIS — I472 Ventricular tachycardia, unspecified: Secondary | ICD-10-CM | POA: Diagnosis not present

## 2021-06-11 DIAGNOSIS — E7800 Pure hypercholesterolemia, unspecified: Secondary | ICD-10-CM | POA: Diagnosis not present

## 2021-06-11 DIAGNOSIS — M899 Disorder of bone, unspecified: Secondary | ICD-10-CM | POA: Diagnosis not present

## 2021-06-21 ENCOUNTER — Other Ambulatory Visit: Payer: Self-pay | Admitting: Cardiology

## 2021-07-15 ENCOUNTER — Other Ambulatory Visit: Payer: Self-pay | Admitting: Obstetrics and Gynecology

## 2021-07-15 DIAGNOSIS — Z1231 Encounter for screening mammogram for malignant neoplasm of breast: Secondary | ICD-10-CM

## 2021-08-17 ENCOUNTER — Telehealth: Payer: Self-pay | Admitting: *Deleted

## 2021-08-17 DIAGNOSIS — M25512 Pain in left shoulder: Secondary | ICD-10-CM | POA: Diagnosis not present

## 2021-08-17 DIAGNOSIS — G8929 Other chronic pain: Secondary | ICD-10-CM | POA: Diagnosis not present

## 2021-08-17 DIAGNOSIS — M25511 Pain in right shoulder: Secondary | ICD-10-CM | POA: Diagnosis not present

## 2021-08-17 NOTE — Telephone Encounter (Signed)
Pt agreeable to plan of care for tele pre op appt 08/21/21 @ 9 am. Med rec and consent are done.

## 2021-08-17 NOTE — Telephone Encounter (Signed)
   Name: Cristina Douglas  DOB: [birth year]  MRN: [id number]  Primary Cardiologist: Rozann Lesches, MD   Preoperative team, please contact this patient and set up a phone call appointment for further preoperative risk assessment. Please obtain consent and complete medication review. Thank you for your help.  I confirm that guidance regarding antiplatelet and oral anticoagulation therapy has been completed and, if necessary, noted below.   Darreld Mclean, PA-C 08/17/2021, 12:38 PM [place]

## 2021-08-17 NOTE — Telephone Encounter (Signed)
   Pre-operative Risk Assessment    Patient Name: Cristina Douglas  DOB: March 12, 1935 MRN: [id number]      Request for Surgical Clearance    Procedure:   Right Total Shoulder Replacement  Date of Surgery:  Clearance TBD                                 Surgeon:  Cherie Dark Stetler DO Surgeon's Group or Practice Name:  [facility] Phone number:  [phone] Fax number:  [phone]   Type of Clearance Requested:   - Medical    Type of Anesthesia:  General    Additional requests/questions:    Phineas Inches   08/17/2021, 12:28 PM

## 2021-08-17 NOTE — Telephone Encounter (Signed)
Pt agreeable to plan of care for tele pre op appt 08/21/21 @ 9 am. Med rec and consent are done.     Patient Consent for Virtual Visit        Cristina Douglas has provided verbal consent on 08/17/2021 for a virtual visit (video or telephone).   CONSENT FOR VIRTUAL VISIT FOR:  Cristina Douglas  By participating in this virtual visit I agree to the following:  I hereby voluntarily request, consent and authorize [facility] and its employed or contracted physicians, Engineer, materials, nurse practitioners or other licensed health care professionals (the Practitioner), to provide me with telemedicine health care services (the "Services") as deemed necessary by the treating Practitioner. I acknowledge and consent to receive the Services by the Practitioner via telemedicine. I understand that the telemedicine visit will involve communicating with the Practitioner through live audiovisual communication technology and the disclosure of certain medical information by electronic transmission. I acknowledge that I have been given the opportunity to request an in-person assessment or other available alternative prior to the telemedicine visit and am voluntarily participating in the telemedicine visit.  I understand that I have the right to withhold or withdraw my consent to the use of telemedicine in the course of my care at any time, without affecting my right to future care or treatment, and that the Practitioner or I may terminate the telemedicine visit at any time. I understand that I have the right to inspect all information obtained and/or recorded in the course of the telemedicine visit and may receive copies of available information for a reasonable fee.  I understand that some of the potential risks of receiving the Services via telemedicine include:  Delay or interruption in medical evaluation due to technological equipment failure or disruption; Information transmitted may not be sufficient  (e.g. poor resolution of images) to allow for appropriate medical decision making by the Practitioner; and/or  In rare instances, security protocols could fail, causing a breach of personal health information.  Furthermore, I acknowledge that it is my responsibility to provide information about my medical history, conditions and care that is complete and accurate to the best of my ability. I acknowledge that Practitioner's advice, recommendations, and/or decision may be based on factors not within their control, such as incomplete or inaccurate data provided by me or distortions of diagnostic images or specimens that may result from electronic transmissions. I understand that the practice of medicine is not an exact science and that Practitioner makes no warranties or guarantees regarding treatment outcomes. I acknowledge that a copy of this consent can be made available to me via my patient portal (Cristina Douglas), or I can request a printed copy by calling the office of Cristina Douglas.    I understand that my insurance will be billed for this visit.   I have read or had this consent read to me. I understand the contents of this consent, which adequately explains the benefits and risks of the Services being provided via telemedicine.  I have been provided ample opportunity to ask questions regarding this consent and the Services and have had my questions answered to my satisfaction. I give my informed consent for the services to be provided through the use of telemedicine in my medical care

## 2021-08-20 ENCOUNTER — Ambulatory Visit
Admission: RE | Admit: 2021-08-20 | Discharge: 2021-08-20 | Disposition: A | Discharge status: Home / Self Care | Payer: Medicare PPO | Source: Ambulatory Visit | Attending: Obstetrics and Gynecology | Admitting: Obstetrics and Gynecology

## 2021-08-20 DIAGNOSIS — Z1231 Encounter for screening mammogram for malignant neoplasm of breast: Secondary | ICD-10-CM | POA: Diagnosis not present

## 2021-08-21 ENCOUNTER — Ambulatory Visit (INDEPENDENT_AMBULATORY_CARE_PROVIDER_SITE_OTHER): Payer: Medicare PPO | Admitting: Nurse Practitioner

## 2021-08-21 DIAGNOSIS — Z01810 Encounter for preprocedural cardiovascular examination: Secondary | ICD-10-CM | POA: Diagnosis not present

## 2021-08-21 NOTE — Progress Notes (Signed)
Virtual Visit via Telephone Note   Because of Cristina Douglas's co-morbid illnesses, she is at least at moderate risk for complications without adequate follow up.  This format is felt to be most appropriate for this patient at this time.  The patient did not have access to video technology/had technical difficulties with video requiring transitioning to audio format only (telephone).  All issues noted in this document were discussed and addressed.  No physical exam could be performed with this format.  Please refer to the patient's chart for her consent to telehealth for [facility]  Evaluation Performed:  Preoperative cardiovascular risk assessment _____________   Date:  08/21/2021   Patient ID:  Cristina Douglas, DOB May 08, 1935, MRN [id number] Patient Location:  Home Provider location:   Office  Primary Care Provider:  Curlene Labrum, MD Primary Cardiologist:  Rozann Lesches, MD  Chief Complaint / Patient Profile   86 y.o. y/o female with a h/o palpitations, RVOT VT, hypertension, hyperlipidemia, type 2 diabetes, and hypothyroidism who is pending right total shoulder replacement, date TBD, with Dr. Cherie Dark Stetler of [facility] and presents today for telephonic preoperative cardiovascular risk assessment.  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   Cataract    Diverticulitis    Diverticulosis    Essential hypertension    Hyperlipidemia    Hypothyroidism    PVC's (premature ventricular contractions)    Type 2 diabetes mellitus ([place])    Ventricular tachycardia ([place])    Suspected RVOT VT, normal coronaries October 2021   Past Surgical History:  Procedure Laterality Date   APPENDECTOMY     BREAST BIOPSY Right 04/10/2013   BREAST LUMPECTOMY Right 05/09/2013   BREAST LUMPECTOMY WITH RADIOACTIVE SEED  LOCALIZATION Right 05/09/2013   Procedure: BREAST LUMPECTOMY WITH RADIOACTIVE SEED LOCALIZATION;  Surgeon: Adin Hector, MD;  Location: [place];  Service: General;  Laterality: Right;   COLONOSCOPY     ESOPHAGOGASTRODUODENOSCOPY N/A 11/14/2019   Procedure: ESOPHAGOGASTRODUODENOSCOPY (EGD);  Surgeon: Carol Ada, MD;  Location: [place];  Service: Endoscopy;  Laterality: N/A;   EYE SURGERY     both cataracts   FOREIGN BODY REMOVAL  11/14/2019   Procedure: FOREIGN BODY REMOVAL;  Surgeon: Carol Ada, MD;  Location: [facility] ENDOSCOPY;  Service: Endoscopy;;   TONSILLECTOMY     TUBAL LIGATION      Allergies  Allergies  Allergen Reactions   Sulfa Antibiotics Rash    History of Present Illness    Cristina Douglas is a 86 y.o. female who presents via Engineer, civil (consulting) for a telehealth visit today. Pt was last seen in cardiology clinic on 06/03/2021 by Dr. Domenic Polite.  At that time Cristina Douglas was doing well. The patient is now pending procedure as outlined above. Since her last visit, she has done well from a cardiac standpoint. She denies chest pain, palpitations, dyspnea, pnd, orthopnea, n, v, dizziness, syncope, edema, weight gain, or early satiety. All other systems reviewed and are otherwise negative except as noted above.   Home Medications    Prior to Admission medications   Medication Sig Start Date End Date Taking? Authorizing Provider  acetaminophen (TYLENOL) 650 MG CR tablet Take 650 mg by mouth every 8 (eight) hours as needed for pain.    [provider]  atorvastatin (LIPITOR)  80 MG tablet Take 80 mg by mouth at bedtime.  08/20/10   [provider]  Boswellia-Glucosamine-Vit D (OSTEO BI-FLEX ONE PER DAY PO) Take by mouth.    [provider]  Calcium Carbonate-Vitamin D 600-400 MG-UNIT tablet Take 1 tablet by mouth daily.    [provider]  cholecalciferol (VITAMIN D) 1000 UNITS tablet Take 1,000 Units by mouth  daily.      [provider]  Coenzyme Q10 (CO Q 10) 100 MG CAPS Take 50 mg by mouth daily.    [provider]  Dapagliflozin-metFORMIN HCl ER (XIGDUO XR) 5-500 MG TB24 Take 1 tablet by mouth daily. 05/26/20   [provider]  diclofenac Sodium (VOLTAREN) 1 % GEL Apply 4 g topically 4 (four) times daily as needed.    [provider]  flecainide (TAMBOCOR) 50 MG tablet TAKE 1 TABLET BY MOUTH EVERY TWELVE HOURS 03/23/21   Satira Sark, MD  levothyroxine (SYNTHROID, LEVOTHROID) 88 MCG tablet Take 88 mcg by mouth daily before breakfast.     [provider]  lidocaine (XYLOCAINE) 5 % ointment Apply 1 application topically as needed.    [provider]  losartan-hydrochlorothiazide (HYZAAR) 50-12.5 MG tablet Take 1 tablet by mouth 2 (two) times daily.    [provider]  MAGNESIUM OXIDE PO Take 1 tablet by mouth at bedtime.    [provider]  metoprolol tartrate (LOPRESSOR) 25 MG tablet TAKE 1 TABLET BY MOUTH EVERY DAY 06/22/21   Satira Sark, MD  Misc Natural Products (TURMERIC CURCUMIN) CAPS Take 2 capsules by mouth daily.    [provider]  Multiple Vitamin (MULTIVITAMIN) tablet Take 1 tablet by mouth daily.      [provider]  NON FORMULARY as needed. Salon Pas    [provider]  spironolactone (ALDACTONE) 25 MG tablet Take 0.5 tablets (12.5 mg total) by mouth daily. 11/21/20   Verta Ellen., NP    Physical Exam    Vital Signs:  Cristina Douglas does not have vital signs available for review today.  Given telephonic nature of communication, physical exam is limited. AAOx3. NAD. Normal affect.  Speech and respirations are unlabored.  Accessory Clinical Findings    None  Assessment & Plan    1.  Preoperative Cardiovascular Risk Assessment:  According to the Revised Cardiac Risk Index (RCRI), her Perioperative Risk of Major Cardiac Event is (%): 0.4. Her Functional Capacity  in METs is: 7.01 according to the [place] Activity Status Index (DASI). Therefore, based on ACC/AHA guidelines, patient would be at acceptable risk for the planned procedure without further cardiovascular testing.  A copy of this note will be routed to requesting surgeon.  Time:   Today, I have spent 6 minutes with the patient with telehealth technology discussing medical history, symptoms, and management plan.     Lenna Sciara, NP  08/21/2021, 9:08 AM

## 2021-08-25 DIAGNOSIS — E039 Hypothyroidism, unspecified: Secondary | ICD-10-CM | POA: Diagnosis not present

## 2021-08-25 DIAGNOSIS — N3946 Mixed incontinence: Secondary | ICD-10-CM | POA: Diagnosis not present

## 2021-08-25 DIAGNOSIS — M19011 Primary osteoarthritis, right shoulder: Secondary | ICD-10-CM | POA: Diagnosis not present

## 2021-08-25 DIAGNOSIS — Z01810 Encounter for preprocedural cardiovascular examination: Secondary | ICD-10-CM | POA: Diagnosis not present

## 2021-08-25 DIAGNOSIS — I10 Essential (primary) hypertension: Secondary | ICD-10-CM | POA: Diagnosis not present

## 2021-08-25 DIAGNOSIS — D49511 Neoplasm of unspecified behavior of right kidney: Secondary | ICD-10-CM | POA: Diagnosis not present

## 2021-08-25 DIAGNOSIS — I472 Ventricular tachycardia, unspecified: Secondary | ICD-10-CM | POA: Diagnosis not present

## 2021-08-25 DIAGNOSIS — Z6829 Body mass index (BMI) 29.0-29.9, adult: Secondary | ICD-10-CM | POA: Diagnosis not present

## 2021-08-25 DIAGNOSIS — E1165 Type 2 diabetes mellitus with hyperglycemia: Secondary | ICD-10-CM | POA: Diagnosis not present

## 2021-08-25 DIAGNOSIS — E876 Hypokalemia: Secondary | ICD-10-CM | POA: Diagnosis not present

## 2021-08-31 DIAGNOSIS — H9319 Tinnitus, unspecified ear: Secondary | ICD-10-CM | POA: Diagnosis not present

## 2021-08-31 DIAGNOSIS — Z6829 Body mass index (BMI) 29.0-29.9, adult: Secondary | ICD-10-CM | POA: Diagnosis not present

## 2021-08-31 DIAGNOSIS — H9202 Otalgia, left ear: Secondary | ICD-10-CM | POA: Diagnosis not present

## 2021-08-31 DIAGNOSIS — R030 Elevated blood-pressure reading, without diagnosis of hypertension: Secondary | ICD-10-CM | POA: Diagnosis not present

## 2021-09-01 DIAGNOSIS — M25711 Osteophyte, right shoulder: Secondary | ICD-10-CM | POA: Diagnosis not present

## 2021-09-01 DIAGNOSIS — Z01818 Encounter for other preprocedural examination: Secondary | ICD-10-CM | POA: Diagnosis not present

## 2021-09-01 DIAGNOSIS — M19011 Primary osteoarthritis, right shoulder: Secondary | ICD-10-CM | POA: Diagnosis not present

## 2021-09-01 DIAGNOSIS — Z853 Personal history of malignant neoplasm of breast: Secondary | ICD-10-CM | POA: Diagnosis not present

## 2021-09-03 DIAGNOSIS — L821 Other seborrheic keratosis: Secondary | ICD-10-CM | POA: Diagnosis not present

## 2021-09-03 DIAGNOSIS — D692 Other nonthrombocytopenic purpura: Secondary | ICD-10-CM | POA: Diagnosis not present

## 2021-09-03 DIAGNOSIS — Z85828 Personal history of other malignant neoplasm of skin: Secondary | ICD-10-CM | POA: Diagnosis not present

## 2021-09-03 DIAGNOSIS — L820 Inflamed seborrheic keratosis: Secondary | ICD-10-CM | POA: Diagnosis not present

## 2021-09-03 DIAGNOSIS — L570 Actinic keratosis: Secondary | ICD-10-CM | POA: Diagnosis not present

## 2021-09-08 ENCOUNTER — Encounter: Payer: Self-pay | Admitting: *Deleted

## 2021-09-08 ENCOUNTER — Telehealth: Payer: Self-pay | Admitting: *Deleted

## 2021-09-08 NOTE — Patient Instructions (Signed)
Visit Information  Thank you for taking time to visit with me today. Please don't hesitate to contact me if I can be of assistance to you.  Following are the goals we discussed today:  Notify RNCM with questions/concerns  Please call the Suicide and Crisis Lifeline: 988 call the [place] National Suicide Prevention Lifeline: 956-508-9185 or TTY: 613 153 7344 TTY (585)586-2373) to talk to a trained counselor call 1-800-273-TALK (toll free, 24 hour hotline) call the [facility]: [phone] call 911 if you are experiencing a Mental Health or [place] or need someone to talk to.  Patient verbalizes understanding of instructions and care plan provided today and agrees to view in [place]. Active MyChart status and patient understanding of how to access instructions and care plan via MyChart confirmed with patient.     The patient has been provided with contact information for the care management team and has been advised to call with any health related questions or concerns.   Valente David, RN, MSN, [facility] Care Management Coordinator [phone]

## 2021-09-08 NOTE — Patient Outreach (Signed)
  Care Coordination   Initial Visit Note   09/08/2021 Name: Cristina Douglas MRN: [id number] DOB: [birth year]  Cristina Douglas is a 86 y.o. year old female who sees Burdine, Virgina Evener, MD for primary care. I spoke with  Cristina Douglas by phone today.  What matters to the patients health and wellness today?  Member report she is doing "extremely well."  Denies need for ongoing services.  Has shoulder surgery scheduled for next week, has husband and adult children in the area for support.  AWV completed in April this year.     Goals Addressed             This Visit's Progress    COMPLETED: Care Coordination Activities - No follow up needed       Care Coordination Interventions: Evaluation of current treatment plan related to upcoming shoulder surgery and patient's adherence to plan as established by provider Advised patient to contact RNCM with questions about surgery or recovery Reviewed scheduled/upcoming provider appointments including Surgery date for next week, will have follow up with ortho scheduled at discharge Discussed plans with patient for ongoing care management follow up and provided patient with direct contact information for care management team Assessed social determinant of health barriers         SDOH assessments and interventions completed:  Yes  SDOH Interventions Today    Flowsheet Row Most Recent Value  SDOH Interventions   Food Insecurity Interventions Intervention Not Indicated  Housing Interventions Intervention Not Indicated  Transportation Interventions Intervention Not Indicated  Utilities Interventions Intervention Not Indicated        Care Coordination Interventions Activated:  Yes  Care Coordination Interventions:  Yes, provided   Follow up plan: No further intervention required.   Encounter Outcome:  Pt. Visit Completed   Valente David, RN, MSN, [facility] Care Management Care Management Coordinator [phone]

## 2021-09-11 DIAGNOSIS — M25561 Pain in right knee: Secondary | ICD-10-CM | POA: Diagnosis not present

## 2021-09-11 DIAGNOSIS — M19011 Primary osteoarthritis, right shoulder: Secondary | ICD-10-CM | POA: Diagnosis not present

## 2021-09-11 DIAGNOSIS — G8929 Other chronic pain: Secondary | ICD-10-CM | POA: Diagnosis not present

## 2021-09-14 DIAGNOSIS — Z01818 Encounter for other preprocedural examination: Secondary | ICD-10-CM | POA: Diagnosis not present

## 2021-09-15 DIAGNOSIS — M898X2 Other specified disorders of bone, upper arm: Secondary | ICD-10-CM | POA: Diagnosis not present

## 2021-09-15 DIAGNOSIS — Z79890 Hormone replacement therapy (postmenopausal): Secondary | ICD-10-CM | POA: Diagnosis not present

## 2021-09-15 DIAGNOSIS — Z79899 Other long term (current) drug therapy: Secondary | ICD-10-CM | POA: Diagnosis not present

## 2021-09-15 DIAGNOSIS — E119 Type 2 diabetes mellitus without complications: Secondary | ICD-10-CM | POA: Diagnosis not present

## 2021-09-15 DIAGNOSIS — E785 Hyperlipidemia, unspecified: Secondary | ICD-10-CM | POA: Diagnosis not present

## 2021-09-15 DIAGNOSIS — I10 Essential (primary) hypertension: Secondary | ICD-10-CM | POA: Diagnosis not present

## 2021-09-15 DIAGNOSIS — R2689 Other abnormalities of gait and mobility: Secondary | ICD-10-CM | POA: Diagnosis not present

## 2021-09-15 DIAGNOSIS — Z96611 Presence of right artificial shoulder joint: Secondary | ICD-10-CM | POA: Diagnosis not present

## 2021-09-15 DIAGNOSIS — M19011 Primary osteoarthritis, right shoulder: Secondary | ICD-10-CM | POA: Diagnosis not present

## 2021-09-15 DIAGNOSIS — R000 Tachycardia, unspecified: Secondary | ICD-10-CM | POA: Diagnosis not present

## 2021-09-15 DIAGNOSIS — Z471 Aftercare following joint replacement surgery: Secondary | ICD-10-CM | POA: Diagnosis not present

## 2021-09-15 DIAGNOSIS — I493 Ventricular premature depolarization: Secondary | ICD-10-CM | POA: Diagnosis not present

## 2021-09-15 DIAGNOSIS — G8918 Other acute postprocedural pain: Secondary | ICD-10-CM | POA: Diagnosis not present

## 2021-09-15 DIAGNOSIS — I472 Ventricular tachycardia, unspecified: Secondary | ICD-10-CM | POA: Diagnosis not present

## 2021-09-15 DIAGNOSIS — E039 Hypothyroidism, unspecified: Secondary | ICD-10-CM | POA: Diagnosis not present

## 2021-09-16 DIAGNOSIS — Z79899 Other long term (current) drug therapy: Secondary | ICD-10-CM | POA: Diagnosis not present

## 2021-09-16 DIAGNOSIS — R2689 Other abnormalities of gait and mobility: Secondary | ICD-10-CM | POA: Diagnosis not present

## 2021-09-16 DIAGNOSIS — Z79890 Hormone replacement therapy (postmenopausal): Secondary | ICD-10-CM | POA: Diagnosis not present

## 2021-09-16 DIAGNOSIS — E039 Hypothyroidism, unspecified: Secondary | ICD-10-CM | POA: Diagnosis not present

## 2021-09-16 DIAGNOSIS — M19011 Primary osteoarthritis, right shoulder: Secondary | ICD-10-CM | POA: Diagnosis not present

## 2021-09-16 DIAGNOSIS — R000 Tachycardia, unspecified: Secondary | ICD-10-CM | POA: Diagnosis not present

## 2021-09-16 DIAGNOSIS — R070 Pain in throat: Secondary | ICD-10-CM | POA: Diagnosis not present

## 2021-09-16 DIAGNOSIS — I472 Ventricular tachycardia, unspecified: Secondary | ICD-10-CM | POA: Diagnosis not present

## 2021-09-16 DIAGNOSIS — E785 Hyperlipidemia, unspecified: Secondary | ICD-10-CM | POA: Diagnosis not present

## 2021-09-16 DIAGNOSIS — M898X2 Other specified disorders of bone, upper arm: Secondary | ICD-10-CM | POA: Diagnosis not present

## 2021-09-16 DIAGNOSIS — I493 Ventricular premature depolarization: Secondary | ICD-10-CM | POA: Diagnosis not present

## 2021-09-16 DIAGNOSIS — I10 Essential (primary) hypertension: Secondary | ICD-10-CM | POA: Diagnosis not present

## 2021-09-16 DIAGNOSIS — Z8679 Personal history of other diseases of the circulatory system: Secondary | ICD-10-CM | POA: Diagnosis not present

## 2021-09-16 DIAGNOSIS — E119 Type 2 diabetes mellitus without complications: Secondary | ICD-10-CM | POA: Diagnosis not present

## 2021-09-22 DIAGNOSIS — M19011 Primary osteoarthritis, right shoulder: Secondary | ICD-10-CM | POA: Diagnosis not present

## 2021-09-28 DIAGNOSIS — Z96611 Presence of right artificial shoulder joint: Secondary | ICD-10-CM | POA: Diagnosis not present

## 2021-10-02 DIAGNOSIS — I10 Essential (primary) hypertension: Secondary | ICD-10-CM | POA: Diagnosis not present

## 2021-10-02 DIAGNOSIS — E876 Hypokalemia: Secondary | ICD-10-CM | POA: Diagnosis not present

## 2021-10-02 DIAGNOSIS — E7801 Familial hypercholesterolemia: Secondary | ICD-10-CM | POA: Diagnosis not present

## 2021-10-02 DIAGNOSIS — E039 Hypothyroidism, unspecified: Secondary | ICD-10-CM | POA: Diagnosis not present

## 2021-10-02 DIAGNOSIS — E1165 Type 2 diabetes mellitus with hyperglycemia: Secondary | ICD-10-CM | POA: Diagnosis not present

## 2021-10-06 DIAGNOSIS — I10 Essential (primary) hypertension: Secondary | ICD-10-CM | POA: Diagnosis not present

## 2021-10-06 DIAGNOSIS — E039 Hypothyroidism, unspecified: Secondary | ICD-10-CM | POA: Diagnosis not present

## 2021-10-06 DIAGNOSIS — E876 Hypokalemia: Secondary | ICD-10-CM | POA: Diagnosis not present

## 2021-10-06 DIAGNOSIS — Z6829 Body mass index (BMI) 29.0-29.9, adult: Secondary | ICD-10-CM | POA: Diagnosis not present

## 2021-10-06 DIAGNOSIS — Z96611 Presence of right artificial shoulder joint: Secondary | ICD-10-CM | POA: Diagnosis not present

## 2021-10-06 DIAGNOSIS — M19011 Primary osteoarthritis, right shoulder: Secondary | ICD-10-CM | POA: Diagnosis not present

## 2021-10-06 DIAGNOSIS — D49511 Neoplasm of unspecified behavior of right kidney: Secondary | ICD-10-CM | POA: Diagnosis not present

## 2021-10-06 DIAGNOSIS — Z23 Encounter for immunization: Secondary | ICD-10-CM | POA: Diagnosis not present

## 2021-10-06 DIAGNOSIS — E1165 Type 2 diabetes mellitus with hyperglycemia: Secondary | ICD-10-CM | POA: Diagnosis not present

## 2021-10-22 ENCOUNTER — Other Ambulatory Visit: Payer: Self-pay | Admitting: *Deleted

## 2021-10-22 MED ORDER — SPIRONOLACTONE 25 MG PO TABS: 12.5000 mg | ORAL_TABLET | Freq: Every day | ORAL | 3 refills | Status: DC

## 2021-10-26 DIAGNOSIS — Z96611 Presence of right artificial shoulder joint: Secondary | ICD-10-CM | POA: Diagnosis not present

## 2021-12-03 DIAGNOSIS — E1165 Type 2 diabetes mellitus with hyperglycemia: Secondary | ICD-10-CM | POA: Diagnosis not present

## 2021-12-03 DIAGNOSIS — E039 Hypothyroidism, unspecified: Secondary | ICD-10-CM | POA: Diagnosis not present

## 2021-12-03 DIAGNOSIS — E7800 Pure hypercholesterolemia, unspecified: Secondary | ICD-10-CM | POA: Diagnosis not present

## 2021-12-07 ENCOUNTER — Ambulatory Visit: Payer: Medicare PPO | Attending: Cardiology | Admitting: Cardiology

## 2021-12-07 ENCOUNTER — Encounter: Payer: Self-pay | Admitting: Cardiology

## 2021-12-07 VITALS — BP 122/64 | HR 67 | Ht 62.75 in | Wt 167.2 lb

## 2021-12-07 DIAGNOSIS — Z96611 Presence of right artificial shoulder joint: Secondary | ICD-10-CM | POA: Diagnosis not present

## 2021-12-07 DIAGNOSIS — I4729 Other ventricular tachycardia: Secondary | ICD-10-CM | POA: Diagnosis not present

## 2021-12-07 DIAGNOSIS — I10 Essential (primary) hypertension: Secondary | ICD-10-CM

## 2021-12-07 NOTE — Patient Instructions (Addendum)

## 2021-12-07 NOTE — Progress Notes (Signed)
Cardiology Office Note  Date: 12/07/2021   ID: Cristina Douglas, DOB Dec 16, 1935, MRN [id number]  PCP:  Curlene Labrum, MD  Cardiologist:  Rozann Lesches, MD Electrophysiologist:  None   Chief Complaint  Patient presents with   Cardiac follow-up    History of Present Illness: Cristina Douglas is an 86 y.o. female last assessed in August by Ms. Monge NP, I reviewed the note.  She is here for a routine visit.  Continues to do well from a cardiac perspective, no exertional chest pain, no sudden palpitations or syncope.  She underwent interval right total shoulder arthroplasty without cardiac incident.  I reviewed her medications which are stable and outlined below.  She also had recent lab work which I reviewed.  Today's ECG shows sinus rhythm, poor R wave progression with normal QRS and QTc.  Past Medical History:  Diagnosis Date   Basal cell carcinoma    Breast cancer ([place])    DCIS right breast status post radiation and lumpectomy.   Cataract    Diverticulitis    Diverticulosis    Essential hypertension    Hyperlipidemia    Hypothyroidism    PVC's (premature ventricular contractions)    Type 2 diabetes mellitus ([place])    Ventricular tachycardia ([place])    Suspected RVOT VT, normal coronaries October 2021    Past Surgical History:  Procedure Laterality Date   APPENDECTOMY     BREAST BIOPSY Right 04/10/2013   BREAST LUMPECTOMY Right 05/09/2013   BREAST LUMPECTOMY WITH RADIOACTIVE SEED LOCALIZATION Right 05/09/2013   Procedure: BREAST LUMPECTOMY WITH RADIOACTIVE SEED LOCALIZATION;  Surgeon: Adin Hector, MD;  Location: [facility];  Service: General;  Laterality: Right;   COLONOSCOPY     ESOPHAGOGASTRODUODENOSCOPY N/A 11/14/2019   Procedure: ESOPHAGOGASTRODUODENOSCOPY (EGD);  Surgeon: Carol Ada, MD;  Location: [facility];  Service: Endoscopy;  Laterality: N/A;   EYE SURGERY     both cataracts   FOREIGN BODY REMOVAL  11/14/2019   Procedure:  FOREIGN BODY REMOVAL;  Surgeon: Carol Ada, MD;  Location: [facility];  Service: Endoscopy;;   TONSILLECTOMY     TUBAL LIGATION      Current Outpatient Medications  Medication Sig Dispense Refill   acetaminophen (TYLENOL) 650 MG CR tablet Take 650 mg by mouth every 8 (eight) hours as needed for pain.     atorvastatin (LIPITOR) 80 MG tablet Take 80 mg by mouth at bedtime.      Boswellia-Glucosamine-Vit D (OSTEO BI-FLEX ONE PER DAY PO) Take by mouth.     Calcium Carbonate-Vitamin D 600-400 MG-UNIT tablet Take 1 tablet by mouth daily.     cholecalciferol (VITAMIN D) 1000 UNITS tablet Take 1,000 Units by mouth daily.       Coenzyme Q10 (CO Q 10) 100 MG CAPS Take 50 mg by mouth daily.     Dapagliflozin-metFORMIN HCl ER (XIGDUO XR) 5-500 MG TB24 Take 1 tablet by mouth daily.     diclofenac Sodium (VOLTAREN) 1 % GEL Apply 4 g topically 4 (four) times daily as needed.     flecainide (TAMBOCOR) 50 MG tablet TAKE 1 TABLET BY MOUTH EVERY TWELVE HOURS 180 tablet 2   levothyroxine (SYNTHROID, LEVOTHROID) 88 MCG tablet Take 88 mcg by mouth daily before breakfast.      losartan-hydrochlorothiazide (HYZAAR) 50-12.5 MG tablet Take 1 tablet by mouth 2 (two) times daily.     MAGNESIUM OXIDE PO Take 1 tablet by mouth at bedtime.     Menthol-Methyl Salicylate (  SALONPAS PAIN RELIEF PATCH) PTCH Apply 5 % topically as needed (back pain).     metoprolol tartrate (LOPRESSOR) 25 MG tablet TAKE 1 TABLET BY MOUTH EVERY DAY 90 tablet 2   Misc Natural Products (TURMERIC CURCUMIN) CAPS Take 2 capsules by mouth daily.     Multiple Vitamin (MULTIVITAMIN) tablet Take 1 tablet by mouth daily.       spironolactone (ALDACTONE) 25 MG tablet Take 0.5 tablets (12.5 mg total) by mouth daily. 45 tablet 3   No current facility-administered medications for this visit.   Allergies:  Sulfa antibiotics   ROS: No orthopnea or PND.  Physical Exam: VS:  BP 122/64   Pulse 67   Ht 5' 2.75" (1.594 m)   Wt 167 lb 3.2 oz (75.8 kg)    SpO2 95%   BMI 29.85 kg/m , BMI Body mass index is 29.85 kg/m.  Wt Readings from Last 3 Encounters:  12/07/21 167 lb 3.2 oz (75.8 kg)  06/03/21 165 lb 6.4 oz (75 kg)  11/21/20 168 lb 3.2 oz (76.3 kg)    General: Patient appears comfortable at rest. HEENT: Conjunctiva and lids normal. Neck: Supple, no elevated JVP or carotid bruits. Lungs: Clear to auscultation, nonlabored breathing at rest. Cardiac: Regular rate and rhythm, no S3 or significant systolic murmur. Extremities: No pitting edema.  ECG:  An ECG dated 11/21/2020 was personally reviewed today and demonstrated:  Sinus rhythm.  Recent Labwork:  November 2023: BUN 15, creatinine 0.91, potassium 4.7, AST 26, ALT 18, cholesterol 166, triglycerides 114, HDL 50, LDL 95, hemoglobin A1c 7.1%, TSH 2.11  Other Studies Reviewed Today:  Echocardiogram 10/28/2019 [facility]): Summary    1. Technically difficult study.    2. The left ventricle is normal in size with normal wall thickness.    3. The left ventricular systolic function is normal, LVEF is visually  estimated at > 55%.    4. The right ventricle is normal in size, with normal systolic function.    5. There is mild pulmonary hypertension, estimated pulmonary artery systolic  pressure is 33 mmHg.     Cardiac catheterization 10/29/2019 [facility]): Hemodynamics and Left Heart Catheterization  Aortic pressure: 131/60 mm Hg (mean 89 mm Hg)  LVEDP = 10 mm Hg Left Ventriculogram  RAO Left Ventriculogram:Normal  Ejection Fraction (visual estimate): 55% Coronary Angiography Dominance: Right Left Main: The left main coronary artery (LMCA) is a large-caliber vessel that originates from the left coronary sinus. It bifurcates into the left anterior descending (LAD) and left circumflex (LCx) arteries. There is no angiographic evidence of significant disease in the LMCA. LAD: The LAD is a large-caliber vessel that gives off 2 diagonal (D) branches before it wraps around  the apex. D1 is a large-caliber vessel. D2 is a small-caliber vessel. There is no angiographic evidence of significant disease in the LAD. Left Circumflex: The LCx is a large-caliber vessel that gives off 3 obtuse marginal (OM) branches and then continues as a small vessel in the AV groove. OM1 is a very small-caliber vessel. OM2 is a very small-caliber vessel. OM3 is a large-caliber trifurcating vessel. There is no angiographic evidence of significant disease in the LCx. Right Coronary: The right coronary artery (RCA) is a large-caliber vessel originating from the right coronary sinus. It bifurcates distally into a moderate-caliber posterior descending artery (PDA) and moderate posterolateral (PL) branches consistent with a right dominant system. There is no angiographic evidence of significant disease in the RCA.  Assessment and Plan:  1.  RVOT VT, doing well on flecainide and Lopressor with no obvious recurrences.  ECG reviewed and stable.  Recent lab work reviewed as well.  No changes were made.  2.  Essential hypertension, blood pressure well controlled today on Aldactone and Hyzaar.  Renal function and potassium normal.  Medication Adjustments/Labs and Tests Ordered: Current medicines are reviewed at length with the patient today.  Concerns regarding medicines are outlined above.   Tests Ordered: Orders Placed This Encounter  Procedures   EKG 12-Lead    Medication Changes: No orders of the defined types were placed in this encounter.   Disposition:  Follow up  6 months.  Signed, Satira Sark, MD, [facility] 12/07/2021 3:58 PM    [facility] [place], [place], [place] [address] Phone: [phone]; Fax: [phone]

## 2021-12-09 DIAGNOSIS — D49511 Neoplasm of unspecified behavior of right kidney: Secondary | ICD-10-CM | POA: Diagnosis not present

## 2021-12-09 DIAGNOSIS — N3946 Mixed incontinence: Secondary | ICD-10-CM | POA: Diagnosis not present

## 2021-12-13 ENCOUNTER — Other Ambulatory Visit: Payer: Self-pay | Admitting: Cardiology

## 2021-12-17 DIAGNOSIS — E1165 Type 2 diabetes mellitus with hyperglycemia: Secondary | ICD-10-CM | POA: Diagnosis not present

## 2021-12-17 DIAGNOSIS — E039 Hypothyroidism, unspecified: Secondary | ICD-10-CM | POA: Diagnosis not present

## 2021-12-17 DIAGNOSIS — I10 Essential (primary) hypertension: Secondary | ICD-10-CM | POA: Diagnosis not present

## 2021-12-17 DIAGNOSIS — E7800 Pure hypercholesterolemia, unspecified: Secondary | ICD-10-CM | POA: Diagnosis not present

## 2021-12-17 DIAGNOSIS — E876 Hypokalemia: Secondary | ICD-10-CM | POA: Diagnosis not present

## 2021-12-17 DIAGNOSIS — M899 Disorder of bone, unspecified: Secondary | ICD-10-CM | POA: Diagnosis not present

## 2021-12-17 DIAGNOSIS — I4729 Other ventricular tachycardia: Secondary | ICD-10-CM | POA: Diagnosis not present

## 2021-12-31 DIAGNOSIS — E1165 Type 2 diabetes mellitus with hyperglycemia: Secondary | ICD-10-CM | POA: Diagnosis not present

## 2022-03-04 DIAGNOSIS — Z961 Presence of intraocular lens: Secondary | ICD-10-CM | POA: Diagnosis not present

## 2022-03-04 DIAGNOSIS — H2703 Aphakia, bilateral: Secondary | ICD-10-CM | POA: Diagnosis not present

## 2022-03-04 DIAGNOSIS — E119 Type 2 diabetes mellitus without complications: Secondary | ICD-10-CM | POA: Diagnosis not present

## 2022-03-04 DIAGNOSIS — H53143 Visual discomfort, bilateral: Secondary | ICD-10-CM | POA: Diagnosis not present

## 2022-03-07 ENCOUNTER — Other Ambulatory Visit: Payer: Self-pay | Admitting: Cardiology

## 2022-03-09 DIAGNOSIS — Z96611 Presence of right artificial shoulder joint: Secondary | ICD-10-CM | POA: Diagnosis not present

## 2022-03-09 DIAGNOSIS — Z471 Aftercare following joint replacement surgery: Secondary | ICD-10-CM | POA: Diagnosis not present

## 2022-04-29 DIAGNOSIS — E876 Hypokalemia: Secondary | ICD-10-CM | POA: Diagnosis not present

## 2022-04-29 DIAGNOSIS — E1165 Type 2 diabetes mellitus with hyperglycemia: Secondary | ICD-10-CM | POA: Diagnosis not present

## 2022-04-29 DIAGNOSIS — E039 Hypothyroidism, unspecified: Secondary | ICD-10-CM | POA: Diagnosis not present

## 2022-04-29 DIAGNOSIS — E7800 Pure hypercholesterolemia, unspecified: Secondary | ICD-10-CM | POA: Diagnosis not present

## 2022-04-29 DIAGNOSIS — I10 Essential (primary) hypertension: Secondary | ICD-10-CM | POA: Diagnosis not present

## 2022-04-29 DIAGNOSIS — M899 Disorder of bone, unspecified: Secondary | ICD-10-CM | POA: Diagnosis not present

## 2022-05-17 DIAGNOSIS — E1165 Type 2 diabetes mellitus with hyperglycemia: Secondary | ICD-10-CM | POA: Diagnosis not present

## 2022-05-17 DIAGNOSIS — E039 Hypothyroidism, unspecified: Secondary | ICD-10-CM | POA: Diagnosis not present

## 2022-05-17 DIAGNOSIS — E559 Vitamin D deficiency, unspecified: Secondary | ICD-10-CM | POA: Diagnosis not present

## 2022-05-17 DIAGNOSIS — Z758 Other problems related to medical facilities and other health care: Secondary | ICD-10-CM | POA: Diagnosis not present

## 2022-05-17 DIAGNOSIS — E7800 Pure hypercholesterolemia, unspecified: Secondary | ICD-10-CM | POA: Diagnosis not present

## 2022-05-17 DIAGNOSIS — E7801 Familial hypercholesterolemia: Secondary | ICD-10-CM | POA: Diagnosis not present

## 2022-05-17 DIAGNOSIS — Z0001 Encounter for general adult medical examination with abnormal findings: Secondary | ICD-10-CM | POA: Diagnosis not present

## 2022-05-24 DIAGNOSIS — E876 Hypokalemia: Secondary | ICD-10-CM | POA: Diagnosis not present

## 2022-05-24 DIAGNOSIS — M19011 Primary osteoarthritis, right shoulder: Secondary | ICD-10-CM | POA: Diagnosis not present

## 2022-05-24 DIAGNOSIS — Z96611 Presence of right artificial shoulder joint: Secondary | ICD-10-CM | POA: Diagnosis not present

## 2022-05-24 DIAGNOSIS — D49511 Neoplasm of unspecified behavior of right kidney: Secondary | ICD-10-CM | POA: Diagnosis not present

## 2022-05-24 DIAGNOSIS — E039 Hypothyroidism, unspecified: Secondary | ICD-10-CM | POA: Diagnosis not present

## 2022-05-24 DIAGNOSIS — E1165 Type 2 diabetes mellitus with hyperglycemia: Secondary | ICD-10-CM | POA: Diagnosis not present

## 2022-05-24 DIAGNOSIS — I10 Essential (primary) hypertension: Secondary | ICD-10-CM | POA: Diagnosis not present

## 2022-05-24 DIAGNOSIS — I472 Ventricular tachycardia, unspecified: Secondary | ICD-10-CM | POA: Diagnosis not present

## 2022-05-24 DIAGNOSIS — Z0001 Encounter for general adult medical examination with abnormal findings: Secondary | ICD-10-CM | POA: Diagnosis not present

## 2022-06-09 DIAGNOSIS — Z853 Personal history of malignant neoplasm of breast: Secondary | ICD-10-CM | POA: Diagnosis not present

## 2022-06-09 DIAGNOSIS — M810 Age-related osteoporosis without current pathological fracture: Secondary | ICD-10-CM | POA: Diagnosis not present

## 2022-06-09 DIAGNOSIS — E119 Type 2 diabetes mellitus without complications: Secondary | ICD-10-CM | POA: Diagnosis not present

## 2022-07-05 NOTE — Progress Notes (Unsigned)
    Cardiology Office Note  Date: 07/06/2022   ID: Cristina Douglas, DOB 1935/04/20, MRN [id number]  History of Present Illness: Cristina Douglas is an 87 y.o. female last seen in December 2023.  She is here for a follow-up visit.  Overall doing well from a cardiac perspective.  She rarely feels a brief twinge in her chest, no exertional symptoms, no palpitations or syncope.  I reviewed her medications which are stable from a cardiac perspective.  She remains on flecainide and Lopressor.  Also reports being on [facility] with follow-up by endocrinology.  She has lost about 20 pounds.  Hemoglobin A1c was 6.3% in April.  Physical Exam: VS:  BP 124/78   Pulse 65   Ht 5\' 2"  (1.575 m)   Wt 143 lb 8 oz (65.1 kg)   SpO2 95%   BMI 26.25 kg/m , BMI Body mass index is 26.25 kg/m.  Wt Readings from Last 3 Encounters:  07/06/22 143 lb 8 oz (65.1 kg)  12/07/21 167 lb 3.2 oz (75.8 kg)  06/03/21 165 lb 6.4 oz (75 kg)    General: Patient appears comfortable at rest. HEENT: Conjunctiva and lids normal. Neck: Supple, no elevated JVP or carotid bruits. Lungs: Clear to auscultation, nonlabored breathing at rest. Cardiac: Regular rate and rhythm, no S3 or significant systolic murmur.  ECG:  An ECG dated 12/07/2021 was personally reviewed today and demonstrated:  Sinus rhythm, poor R wave progression, normal QRS and QTc.  Labwork:  November 2023: Cholesterol 166, triglycerides 114, HDL 50, LDL 95 April 2024: BUN 17, creatinine 0.87, potassium 4.7, AST 23, ALT 17, hemoglobin A1c 6.3%, TSH 1.34  Other Studies Reviewed Today:  No interval cardiac testing for review today.  Assessment and Plan:  1.  RVOT VT, clinically stable on flecainide and Lopressor.  Cardiac catheterization in December 2021 at [facility] did not demonstrate obstructive disease and LVEF greater than 55% by concurrent echocardiogram.  Continue with current medications and observation.  2.  Essential hypertension.  Blood  pressure well-controlled today.  She is also on Aldactone.  Disposition:  Follow up  6 months.  Signed, Jonelle Sidle, M.D., F.A.C.C. [facility] at [facility]

## 2022-07-06 ENCOUNTER — Ambulatory Visit: Payer: Medicare PPO | Attending: Cardiology | Admitting: Cardiology

## 2022-07-06 ENCOUNTER — Encounter: Payer: Self-pay | Admitting: Cardiology

## 2022-07-06 VITALS — BP 124/78 | HR 65 | Ht 62.0 in | Wt 143.5 lb

## 2022-07-06 DIAGNOSIS — I4729 Other ventricular tachycardia: Secondary | ICD-10-CM

## 2022-07-06 DIAGNOSIS — I10 Essential (primary) hypertension: Secondary | ICD-10-CM | POA: Diagnosis not present

## 2022-07-06 NOTE — Patient Instructions (Signed)

## 2022-08-20 DIAGNOSIS — Z96611 Presence of right artificial shoulder joint: Secondary | ICD-10-CM | POA: Diagnosis not present

## 2022-09-04 ENCOUNTER — Other Ambulatory Visit: Payer: Self-pay | Admitting: Cardiology

## 2022-09-15 ENCOUNTER — Other Ambulatory Visit: Payer: Self-pay | Admitting: Obstetrics and Gynecology

## 2022-09-15 DIAGNOSIS — Z1231 Encounter for screening mammogram for malignant neoplasm of breast: Secondary | ICD-10-CM

## 2022-09-16 DIAGNOSIS — Z85828 Personal history of other malignant neoplasm of skin: Secondary | ICD-10-CM | POA: Diagnosis not present

## 2022-09-16 DIAGNOSIS — D485 Neoplasm of uncertain behavior of skin: Secondary | ICD-10-CM | POA: Diagnosis not present

## 2022-09-16 DIAGNOSIS — D0462 Carcinoma in situ of skin of left upper limb, including shoulder: Secondary | ICD-10-CM | POA: Diagnosis not present

## 2022-09-16 DIAGNOSIS — L821 Other seborrheic keratosis: Secondary | ICD-10-CM | POA: Diagnosis not present

## 2022-09-16 DIAGNOSIS — L570 Actinic keratosis: Secondary | ICD-10-CM | POA: Diagnosis not present

## 2022-10-14 ENCOUNTER — Ambulatory Visit
Admission: RE | Admit: 2022-10-14 | Discharge: 2022-10-14 | Disposition: A | Discharge status: Home / Self Care | Payer: Medicare PPO | Source: Ambulatory Visit | Attending: Obstetrics and Gynecology | Admitting: Obstetrics and Gynecology

## 2022-10-14 DIAGNOSIS — Z1231 Encounter for screening mammogram for malignant neoplasm of breast: Secondary | ICD-10-CM | POA: Diagnosis not present

## 2022-10-28 DIAGNOSIS — E7800 Pure hypercholesterolemia, unspecified: Secondary | ICD-10-CM | POA: Diagnosis not present

## 2022-10-28 DIAGNOSIS — E1165 Type 2 diabetes mellitus with hyperglycemia: Secondary | ICD-10-CM | POA: Diagnosis not present

## 2022-10-28 DIAGNOSIS — I10 Essential (primary) hypertension: Secondary | ICD-10-CM | POA: Diagnosis not present

## 2022-10-28 DIAGNOSIS — E039 Hypothyroidism, unspecified: Secondary | ICD-10-CM | POA: Diagnosis not present

## 2022-10-29 DIAGNOSIS — S61451A Open bite of right hand, initial encounter: Secondary | ICD-10-CM | POA: Diagnosis not present

## 2022-10-29 DIAGNOSIS — R030 Elevated blood-pressure reading, without diagnosis of hypertension: Secondary | ICD-10-CM | POA: Diagnosis not present

## 2022-10-29 DIAGNOSIS — Z6824 Body mass index (BMI) 24.0-24.9, adult: Secondary | ICD-10-CM | POA: Diagnosis not present

## 2022-11-02 DIAGNOSIS — Z6824 Body mass index (BMI) 24.0-24.9, adult: Secondary | ICD-10-CM | POA: Diagnosis not present

## 2022-11-02 DIAGNOSIS — S61451A Open bite of right hand, initial encounter: Secondary | ICD-10-CM | POA: Diagnosis not present

## 2022-11-02 DIAGNOSIS — R030 Elevated blood-pressure reading, without diagnosis of hypertension: Secondary | ICD-10-CM | POA: Diagnosis not present

## 2022-11-04 DIAGNOSIS — M899 Disorder of bone, unspecified: Secondary | ICD-10-CM | POA: Diagnosis not present

## 2022-11-04 DIAGNOSIS — E876 Hypokalemia: Secondary | ICD-10-CM | POA: Diagnosis not present

## 2022-11-04 DIAGNOSIS — C649 Malignant neoplasm of unspecified kidney, except renal pelvis: Secondary | ICD-10-CM | POA: Diagnosis not present

## 2022-11-04 DIAGNOSIS — I10 Essential (primary) hypertension: Secondary | ICD-10-CM | POA: Diagnosis not present

## 2022-11-04 DIAGNOSIS — E1165 Type 2 diabetes mellitus with hyperglycemia: Secondary | ICD-10-CM | POA: Diagnosis not present

## 2022-11-04 DIAGNOSIS — E7800 Pure hypercholesterolemia, unspecified: Secondary | ICD-10-CM | POA: Diagnosis not present

## 2022-11-04 DIAGNOSIS — E039 Hypothyroidism, unspecified: Secondary | ICD-10-CM | POA: Diagnosis not present

## 2022-11-09 DIAGNOSIS — E039 Hypothyroidism, unspecified: Secondary | ICD-10-CM | POA: Diagnosis not present

## 2022-11-09 DIAGNOSIS — E7801 Familial hypercholesterolemia: Secondary | ICD-10-CM | POA: Diagnosis not present

## 2022-11-09 DIAGNOSIS — E876 Hypokalemia: Secondary | ICD-10-CM | POA: Diagnosis not present

## 2022-11-09 DIAGNOSIS — E1165 Type 2 diabetes mellitus with hyperglycemia: Secondary | ICD-10-CM | POA: Diagnosis not present

## 2022-11-16 DIAGNOSIS — E039 Hypothyroidism, unspecified: Secondary | ICD-10-CM | POA: Diagnosis not present

## 2022-11-16 DIAGNOSIS — Z6824 Body mass index (BMI) 24.0-24.9, adult: Secondary | ICD-10-CM | POA: Diagnosis not present

## 2022-11-16 DIAGNOSIS — M8580 Other specified disorders of bone density and structure, unspecified site: Secondary | ICD-10-CM | POA: Diagnosis not present

## 2022-11-16 DIAGNOSIS — I10 Essential (primary) hypertension: Secondary | ICD-10-CM | POA: Diagnosis not present

## 2022-11-16 DIAGNOSIS — I472 Ventricular tachycardia, unspecified: Secondary | ICD-10-CM | POA: Diagnosis not present

## 2022-11-16 DIAGNOSIS — E1165 Type 2 diabetes mellitus with hyperglycemia: Secondary | ICD-10-CM | POA: Diagnosis not present

## 2022-12-17 DIAGNOSIS — N3946 Mixed incontinence: Secondary | ICD-10-CM | POA: Diagnosis not present

## 2022-12-17 DIAGNOSIS — D49511 Neoplasm of unspecified behavior of right kidney: Secondary | ICD-10-CM | POA: Diagnosis not present

## 2022-12-27 ENCOUNTER — Other Ambulatory Visit: Payer: Self-pay | Admitting: Urology

## 2022-12-27 DIAGNOSIS — D49511 Neoplasm of unspecified behavior of right kidney: Secondary | ICD-10-CM

## 2023-01-03 DIAGNOSIS — H9201 Otalgia, right ear: Secondary | ICD-10-CM | POA: Diagnosis not present

## 2023-01-03 DIAGNOSIS — Z6824 Body mass index (BMI) 24.0-24.9, adult: Secondary | ICD-10-CM | POA: Diagnosis not present

## 2023-01-03 DIAGNOSIS — E1165 Type 2 diabetes mellitus with hyperglycemia: Secondary | ICD-10-CM | POA: Diagnosis not present

## 2023-01-03 DIAGNOSIS — R030 Elevated blood-pressure reading, without diagnosis of hypertension: Secondary | ICD-10-CM | POA: Diagnosis not present

## 2023-01-04 ENCOUNTER — Encounter: Payer: Self-pay | Admitting: Urology

## 2023-01-06 DIAGNOSIS — I10 Essential (primary) hypertension: Secondary | ICD-10-CM | POA: Diagnosis not present

## 2023-01-06 DIAGNOSIS — E039 Hypothyroidism, unspecified: Secondary | ICD-10-CM | POA: Diagnosis not present

## 2023-01-07 ENCOUNTER — Encounter: Payer: Self-pay | Admitting: Urology

## 2023-01-10 ENCOUNTER — Other Ambulatory Visit: Payer: Medicare PPO

## 2023-01-10 ENCOUNTER — Ambulatory Visit: Payer: Medicare PPO | Attending: Cardiology | Admitting: Cardiology

## 2023-01-10 ENCOUNTER — Encounter: Payer: Self-pay | Admitting: Cardiology

## 2023-01-10 VITALS — BP 142/80 | HR 65 | Ht 62.5 in | Wt 141.2 lb

## 2023-01-10 DIAGNOSIS — I4729 Other ventricular tachycardia: Secondary | ICD-10-CM

## 2023-01-10 DIAGNOSIS — I10 Essential (primary) hypertension: Secondary | ICD-10-CM | POA: Diagnosis not present

## 2023-01-10 NOTE — Patient Instructions (Addendum)

## 2023-01-10 NOTE — Progress Notes (Signed)
    Cardiology Office Note  Date: 01/10/2023   ID: Cristina Douglas, DOB 09/22/1935, MRN [id number]  History of Present Illness: Cristina Douglas is an 88 y.o. female last seen in July 2024.  She is here for a routine visit.  Reports no interval palpitations, no chest pain or syncope.  I reviewed her medications.  Current cardiovascular regimen includes Lipitor, flecainide , Hyzaar, Lopressor , and Aldactone .  She is also on Mounjaro, weight has been stable in the upper 130s to low 140s.  She continues to follow with Dr. Lari.  I reviewed her interval lab work.  I reviewed her ECG today which shows sinus rhythm with low voltage.  Physical Exam: VS:  BP (!) 142/80   Pulse 65   Ht 5' 2.5 (1.588 m)   Wt 141 lb 3.2 oz (64 kg)   SpO2 96%   BMI 25.41 kg/m , BMI Body mass index is 25.41 kg/m.  Wt Readings from Last 3 Encounters:  01/10/23 141 lb 3.2 oz (64 kg)  07/06/22 143 lb 8 oz (65.1 kg)  12/07/21 167 lb 3.2 oz (75.8 kg)    General: Patient appears comfortable at rest. HEENT: Conjunctiva and lids normal. Neck: Supple, no elevated JVP or carotid bruits. Lungs: Clear to auscultation, nonlabored breathing at rest. Cardiac: Regular rate and rhythm, no S3 or significant systolic murmur.  ECG:  An ECG dated 12/07/2021 was personally reviewed today and demonstrated:  Sinus rhythm, poor R wave progression, normal QRS and QTc.  Labwork:  October 2024: BUN 15, creatinine 0.84, potassium 4.3, AST 26, ALT 20, cholesterol 130, glycerides 128, HDL 44, LDL 63 January 2025: TSH 1.76, hemoglobin A1c 6.2%  Other Studies Reviewed Today:  No interval cardiac testing for review today.  Assessment and Plan:  1.  RVOT VT. she continues to do well on flecainide  and Lopressor , no interval palpitations, no chest pain or syncope.  Cardiac catheterization in December 2021 at [facility] did not demonstrate obstructive disease and LVEF greater than 55% by concurrent echocardiogram.  ECG  reviewed.  No changes were made today.   2.  Primary hypertension.  Blood pressure up some today.  Would continue to follow with PCP and track blood pressure going forward.  She is on Hyzaar in addition to Lopressor  and Aldactone .   Disposition:  Follow up  6 months.  Signed, Jayson JUDITHANN Sierras, M.D., F.A.C.C. [facility] at [facility]

## 2023-01-12 DIAGNOSIS — Z01419 Encounter for gynecological examination (general) (routine) without abnormal findings: Secondary | ICD-10-CM | POA: Diagnosis not present

## 2023-01-12 DIAGNOSIS — Z6825 Body mass index (BMI) 25.0-25.9, adult: Secondary | ICD-10-CM | POA: Diagnosis not present

## 2023-01-20 ENCOUNTER — Ambulatory Visit
Admission: RE | Admit: 2023-01-20 | Discharge: 2023-01-20 | Disposition: A | Discharge status: Home / Self Care | Payer: Medicare PPO | Source: Ambulatory Visit | Attending: Urology | Admitting: Urology

## 2023-01-20 DIAGNOSIS — N289 Disorder of kidney and ureter, unspecified: Secondary | ICD-10-CM | POA: Diagnosis not present

## 2023-01-20 DIAGNOSIS — D49511 Neoplasm of unspecified behavior of right kidney: Secondary | ICD-10-CM

## 2023-01-20 MED ORDER — GADOPICLENOL 0.5 MMOL/ML IV SOLN
6.0000 mL | Freq: Once | INTRAVENOUS | Status: AC | PRN
  Administered 2023-01-20: 6 mL via INTRAVENOUS

## 2023-01-31 ENCOUNTER — Other Ambulatory Visit: Payer: Medicare PPO

## 2023-02-08 ENCOUNTER — Other Ambulatory Visit: Payer: Medicare PPO

## 2023-02-18 DIAGNOSIS — Z96611 Presence of right artificial shoulder joint: Secondary | ICD-10-CM | POA: Diagnosis not present

## 2023-02-18 DIAGNOSIS — M25511 Pain in right shoulder: Secondary | ICD-10-CM | POA: Diagnosis not present

## 2023-02-25 DIAGNOSIS — Z96611 Presence of right artificial shoulder joint: Secondary | ICD-10-CM | POA: Diagnosis not present

## 2023-02-25 DIAGNOSIS — Z09 Encounter for follow-up examination after completed treatment for conditions other than malignant neoplasm: Secondary | ICD-10-CM | POA: Diagnosis not present

## 2023-02-25 DIAGNOSIS — M19011 Primary osteoarthritis, right shoulder: Secondary | ICD-10-CM | POA: Diagnosis not present

## 2023-03-04 ENCOUNTER — Other Ambulatory Visit: Payer: Self-pay | Admitting: Cardiology

## 2023-03-09 DIAGNOSIS — Z6825 Body mass index (BMI) 25.0-25.9, adult: Secondary | ICD-10-CM | POA: Diagnosis not present

## 2023-03-09 DIAGNOSIS — Z20828 Contact with and (suspected) exposure to other viral communicable diseases: Secondary | ICD-10-CM | POA: Diagnosis not present

## 2023-03-09 DIAGNOSIS — J019 Acute sinusitis, unspecified: Secondary | ICD-10-CM | POA: Diagnosis not present

## 2023-03-11 DIAGNOSIS — M25511 Pain in right shoulder: Secondary | ICD-10-CM | POA: Diagnosis not present

## 2023-03-11 DIAGNOSIS — Z96611 Presence of right artificial shoulder joint: Secondary | ICD-10-CM | POA: Diagnosis not present

## 2023-03-11 DIAGNOSIS — G8929 Other chronic pain: Secondary | ICD-10-CM | POA: Diagnosis not present

## 2023-03-11 DIAGNOSIS — M25561 Pain in right knee: Secondary | ICD-10-CM | POA: Diagnosis not present

## 2023-04-21 DIAGNOSIS — H53143 Visual discomfort, bilateral: Secondary | ICD-10-CM | POA: Diagnosis not present

## 2023-04-21 DIAGNOSIS — H2703 Aphakia, bilateral: Secondary | ICD-10-CM | POA: Diagnosis not present

## 2023-04-21 DIAGNOSIS — E119 Type 2 diabetes mellitus without complications: Secondary | ICD-10-CM | POA: Diagnosis not present

## 2023-04-21 DIAGNOSIS — Z961 Presence of intraocular lens: Secondary | ICD-10-CM | POA: Diagnosis not present

## 2023-04-28 DIAGNOSIS — E7800 Pure hypercholesterolemia, unspecified: Secondary | ICD-10-CM | POA: Diagnosis not present

## 2023-04-28 DIAGNOSIS — I10 Essential (primary) hypertension: Secondary | ICD-10-CM | POA: Diagnosis not present

## 2023-04-28 DIAGNOSIS — E039 Hypothyroidism, unspecified: Secondary | ICD-10-CM | POA: Diagnosis not present

## 2023-04-28 DIAGNOSIS — E1165 Type 2 diabetes mellitus with hyperglycemia: Secondary | ICD-10-CM | POA: Diagnosis not present

## 2023-05-03 DIAGNOSIS — E876 Hypokalemia: Secondary | ICD-10-CM | POA: Diagnosis not present

## 2023-05-03 DIAGNOSIS — I10 Essential (primary) hypertension: Secondary | ICD-10-CM | POA: Diagnosis not present

## 2023-05-03 DIAGNOSIS — E7800 Pure hypercholesterolemia, unspecified: Secondary | ICD-10-CM | POA: Diagnosis not present

## 2023-05-03 DIAGNOSIS — C649 Malignant neoplasm of unspecified kidney, except renal pelvis: Secondary | ICD-10-CM | POA: Diagnosis not present

## 2023-05-03 DIAGNOSIS — M899 Disorder of bone, unspecified: Secondary | ICD-10-CM | POA: Diagnosis not present

## 2023-05-03 DIAGNOSIS — E1165 Type 2 diabetes mellitus with hyperglycemia: Secondary | ICD-10-CM | POA: Diagnosis not present

## 2023-05-03 DIAGNOSIS — E039 Hypothyroidism, unspecified: Secondary | ICD-10-CM | POA: Diagnosis not present

## 2023-05-24 DIAGNOSIS — E559 Vitamin D deficiency, unspecified: Secondary | ICD-10-CM | POA: Diagnosis not present

## 2023-05-24 DIAGNOSIS — I10 Essential (primary) hypertension: Secondary | ICD-10-CM | POA: Diagnosis not present

## 2023-05-24 DIAGNOSIS — Z1321 Encounter for screening for nutritional disorder: Secondary | ICD-10-CM | POA: Diagnosis not present

## 2023-05-24 DIAGNOSIS — R42 Dizziness and giddiness: Secondary | ICD-10-CM | POA: Diagnosis not present

## 2023-05-24 DIAGNOSIS — E039 Hypothyroidism, unspecified: Secondary | ICD-10-CM | POA: Diagnosis not present

## 2023-05-24 DIAGNOSIS — E119 Type 2 diabetes mellitus without complications: Secondary | ICD-10-CM | POA: Diagnosis not present

## 2023-05-24 DIAGNOSIS — Z13220 Encounter for screening for lipoid disorders: Secondary | ICD-10-CM | POA: Diagnosis not present

## 2023-05-24 DIAGNOSIS — D559 Anemia due to enzyme disorder, unspecified: Secondary | ICD-10-CM | POA: Diagnosis not present

## 2023-05-31 DIAGNOSIS — N1831 Chronic kidney disease, stage 3a: Secondary | ICD-10-CM | POA: Diagnosis not present

## 2023-05-31 DIAGNOSIS — Z1389 Encounter for screening for other disorder: Secondary | ICD-10-CM | POA: Diagnosis not present

## 2023-05-31 DIAGNOSIS — Z1331 Encounter for screening for depression: Secondary | ICD-10-CM | POA: Diagnosis not present

## 2023-05-31 DIAGNOSIS — Z6825 Body mass index (BMI) 25.0-25.9, adult: Secondary | ICD-10-CM | POA: Diagnosis not present

## 2023-05-31 DIAGNOSIS — Z0001 Encounter for general adult medical examination with abnormal findings: Secondary | ICD-10-CM | POA: Diagnosis not present

## 2023-05-31 DIAGNOSIS — E1122 Type 2 diabetes mellitus with diabetic chronic kidney disease: Secondary | ICD-10-CM | POA: Diagnosis not present

## 2023-08-05 ENCOUNTER — Other Ambulatory Visit: Payer: Self-pay | Admitting: Cardiology

## 2023-08-30 ENCOUNTER — Other Ambulatory Visit: Payer: Self-pay | Admitting: Cardiology

## 2023-09-01 ENCOUNTER — Other Ambulatory Visit: Payer: Self-pay | Admitting: Cardiology

## 2023-09-29 DIAGNOSIS — Z85828 Personal history of other malignant neoplasm of skin: Secondary | ICD-10-CM | POA: Diagnosis not present

## 2023-09-29 DIAGNOSIS — D692 Other nonthrombocytopenic purpura: Secondary | ICD-10-CM | POA: Diagnosis not present

## 2023-09-29 DIAGNOSIS — L570 Actinic keratosis: Secondary | ICD-10-CM | POA: Diagnosis not present

## 2023-09-29 DIAGNOSIS — L821 Other seborrheic keratosis: Secondary | ICD-10-CM | POA: Diagnosis not present

## 2023-10-10 ENCOUNTER — Other Ambulatory Visit: Payer: Self-pay | Admitting: Obstetrics and Gynecology

## 2023-10-10 DIAGNOSIS — Z1231 Encounter for screening mammogram for malignant neoplasm of breast: Secondary | ICD-10-CM

## 2023-10-12 ENCOUNTER — Encounter: Payer: Self-pay | Admitting: Cardiology

## 2023-10-12 ENCOUNTER — Ambulatory Visit: Attending: Cardiology | Admitting: Cardiology

## 2023-10-12 VITALS — BP 134/76 | HR 71 | Ht 63.0 in | Wt 144.6 lb

## 2023-10-12 DIAGNOSIS — I10 Essential (primary) hypertension: Secondary | ICD-10-CM

## 2023-10-12 DIAGNOSIS — I4729 Other ventricular tachycardia: Secondary | ICD-10-CM | POA: Diagnosis not present

## 2023-10-12 NOTE — Patient Instructions (Signed)
 Medication Instructions:  Your physician recommends that you continue on your current medications as directed. Please refer to the Current Medication list given to you today.   Labwork: None today  Testing/Procedures: None today  Follow-Up: 6 months  Any Other Special Instructions Will Be Listed Below (If Applicable).  If you need a refill on your cardiac medications before your next appointment, please call your pharmacy.

## 2023-10-12 NOTE — Progress Notes (Signed)
    Cardiology Office Note  Date: 10/12/2023   ID: Alika Eppes Mckeone, DOB [birth year], MRN [id number]  History of Present Illness: Cristina Douglas is an 88 y.o. female last seen in January.  She is here today for a follow-up visit.  Reports no palpitations or syncope, no exertional chest pain.  She remains active with ADLs.  I reviewed her medications.  No change from a cardiac perspective.  She continues to follow with Dr. Lari.  I reviewed interval lab work from earlier this year.  She had an ECG in January.  Physical Exam: VS:  BP 134/76 (BP Location: Left Arm)   Pulse 71   Ht 5' 3 (1.6 m)   Wt 144 lb 9.6 oz (65.6 kg)   SpO2 97%   BMI 25.61 kg/m , BMI Body mass index is 25.61 kg/m.  Wt Readings from Last 3 Encounters:  10/12/23 144 lb 9.6 oz (65.6 kg)  01/10/23 141 lb 3.2 oz (64 kg)  07/06/22 143 lb 8 oz (65.1 kg)    General: Patient appears comfortable at rest. HEENT: Conjunctiva and lids normal. Neck: Supple, no elevated JVP or carotid bruits. Lungs: Clear to auscultation, nonlabored breathing at rest. Cardiac: Regular rate and rhythm, no S3 or significant systolic murmur. Extremities: No pitting edema.  ECG:  An ECG dated 01/10/2023 was personally reviewed today and demonstrated:  Sinus rhythm with low voltage.  Labwork:  April 2025: BUN 14, creatinine 0.88, GFR 64, potassium 4.7, AST 24, ALT 21, cholesterol 136, triglycerides 79, HDL 46, LDL 74, hemoglobin A1c 6.2%, TSH 1.63  Other Studies Reviewed Today:  No interval cardiac testing for review today.  Assessment and Plan:  1.  RVOT VT.  Cardiac catheterization in December 2021 at [facility] did not demonstrate obstructive disease and LVEF greater than 55% by concurrent echocardiogram.  She remains symptomatically stable, no interval palpitations or syncope, no chest pain.  Continue Lopressor  25 mg twice daily and flecainide  50 mg twice daily.  Follow-up ECG for next visit.   2.  Primary hypertension.  No  changes made today.  Continue Hyzaar 50/12.5 mg daily and Aldactone  12.5 mg daily.  Renal function and potassium normal in April.  3.  Mixed hyperlipidemia on Lipitor 80 mg daily.  LDL 74 in April.  Disposition:  Follow up 6 months.  Signed, Jayson JUDITHANN Sierras, M.D., F.A.C.C. [facility] at [facility]

## 2023-10-20 ENCOUNTER — Ambulatory Visit
Admission: RE | Admit: 2023-10-20 | Discharge: 2023-10-20 | Disposition: A | Discharge status: Home / Self Care | Source: Ambulatory Visit | Attending: Obstetrics and Gynecology | Admitting: Obstetrics and Gynecology

## 2023-10-20 DIAGNOSIS — Z1231 Encounter for screening mammogram for malignant neoplasm of breast: Secondary | ICD-10-CM | POA: Diagnosis not present

## 2023-10-21 ENCOUNTER — Other Ambulatory Visit: Payer: Self-pay | Admitting: Medical Genetics

## 2023-10-24 ENCOUNTER — Other Ambulatory Visit (HOSPITAL_COMMUNITY)
Admission: RE | Admit: 2023-10-24 | Discharge: 2023-10-24 | Disposition: A | Discharge status: Home / Self Care | Payer: Self-pay | Source: Ambulatory Visit | Attending: Oncology | Admitting: Oncology

## 2023-11-01 LAB — GENECONNECT MOLECULAR SCREEN: Genetic Analysis Overall Interpretation: NEGATIVE

## 2023-11-03 DIAGNOSIS — E7800 Pure hypercholesterolemia, unspecified: Secondary | ICD-10-CM | POA: Diagnosis not present

## 2023-11-03 DIAGNOSIS — I10 Essential (primary) hypertension: Secondary | ICD-10-CM | POA: Diagnosis not present

## 2023-11-03 DIAGNOSIS — E039 Hypothyroidism, unspecified: Secondary | ICD-10-CM | POA: Diagnosis not present

## 2023-11-03 DIAGNOSIS — E1165 Type 2 diabetes mellitus with hyperglycemia: Secondary | ICD-10-CM | POA: Diagnosis not present

## 2023-11-10 ENCOUNTER — Other Ambulatory Visit: Payer: Self-pay | Admitting: Urology

## 2023-11-10 DIAGNOSIS — N2889 Other specified disorders of kidney and ureter: Secondary | ICD-10-CM

## 2023-11-11 ENCOUNTER — Encounter: Payer: Self-pay | Admitting: Urology

## 2023-11-17 DIAGNOSIS — E876 Hypokalemia: Secondary | ICD-10-CM | POA: Diagnosis not present

## 2023-11-17 DIAGNOSIS — I10 Essential (primary) hypertension: Secondary | ICD-10-CM | POA: Diagnosis not present

## 2023-11-17 DIAGNOSIS — E7800 Pure hypercholesterolemia, unspecified: Secondary | ICD-10-CM | POA: Diagnosis not present

## 2023-11-17 DIAGNOSIS — E1165 Type 2 diabetes mellitus with hyperglycemia: Secondary | ICD-10-CM | POA: Diagnosis not present

## 2023-11-17 DIAGNOSIS — C649 Malignant neoplasm of unspecified kidney, except renal pelvis: Secondary | ICD-10-CM | POA: Diagnosis not present

## 2023-11-17 DIAGNOSIS — M899 Disorder of bone, unspecified: Secondary | ICD-10-CM | POA: Diagnosis not present

## 2023-11-17 DIAGNOSIS — E039 Hypothyroidism, unspecified: Secondary | ICD-10-CM | POA: Diagnosis not present

## 2023-11-22 DIAGNOSIS — R42 Dizziness and giddiness: Secondary | ICD-10-CM | POA: Diagnosis not present

## 2023-11-22 DIAGNOSIS — Z0001 Encounter for general adult medical examination with abnormal findings: Secondary | ICD-10-CM | POA: Diagnosis not present

## 2023-11-22 DIAGNOSIS — E876 Hypokalemia: Secondary | ICD-10-CM | POA: Diagnosis not present

## 2023-11-22 DIAGNOSIS — I10 Essential (primary) hypertension: Secondary | ICD-10-CM | POA: Diagnosis not present

## 2023-11-22 DIAGNOSIS — N1831 Chronic kidney disease, stage 3a: Secondary | ICD-10-CM | POA: Diagnosis not present

## 2023-11-22 DIAGNOSIS — Z13220 Encounter for screening for lipoid disorders: Secondary | ICD-10-CM | POA: Diagnosis not present

## 2023-11-22 DIAGNOSIS — E1122 Type 2 diabetes mellitus with diabetic chronic kidney disease: Secondary | ICD-10-CM | POA: Diagnosis not present

## 2023-12-10 ENCOUNTER — Ambulatory Visit
Admission: RE | Admit: 2023-12-10 | Discharge: 2023-12-10 | Disposition: A | Source: Ambulatory Visit | Attending: Urology | Admitting: Urology

## 2023-12-10 DIAGNOSIS — N2889 Other specified disorders of kidney and ureter: Secondary | ICD-10-CM

## 2023-12-10 DIAGNOSIS — N289 Disorder of kidney and ureter, unspecified: Secondary | ICD-10-CM | POA: Diagnosis not present

## 2023-12-10 MED ORDER — GADOPICLENOL 0.5 MMOL/ML IV SOLN
7.0000 mL | Freq: Once | INTRAVENOUS | Status: AC | PRN
  Administered 2023-12-10: 7 mL via INTRAVENOUS

## 2024-03-27 ENCOUNTER — Ambulatory Visit: Admitting: Cardiology
